# Patient Record
Sex: Male | Born: 1950 | Race: Black or African American | Hispanic: No | Marital: Single | Smoking: Current some day smoker
Health system: Southern US, Community
[De-identification: ages and names within clinical notes are randomized; demographics above are authoritative.]

## PROBLEM LIST (undated history)

## (undated) DIAGNOSIS — E669 Obesity, unspecified: Secondary | ICD-10-CM

## (undated) DIAGNOSIS — K219 Gastro-esophageal reflux disease without esophagitis: Secondary | ICD-10-CM

## (undated) DIAGNOSIS — E785 Hyperlipidemia, unspecified: Secondary | ICD-10-CM

## (undated) DIAGNOSIS — F419 Anxiety disorder, unspecified: Secondary | ICD-10-CM

## (undated) DIAGNOSIS — M1990 Unspecified osteoarthritis, unspecified site: Secondary | ICD-10-CM

## (undated) DIAGNOSIS — K759 Inflammatory liver disease, unspecified: Secondary | ICD-10-CM

## (undated) DIAGNOSIS — G4730 Sleep apnea, unspecified: Secondary | ICD-10-CM

## (undated) DIAGNOSIS — I10 Essential (primary) hypertension: Secondary | ICD-10-CM

## (undated) DIAGNOSIS — R7303 Prediabetes: Secondary | ICD-10-CM

## (undated) DIAGNOSIS — K635 Polyp of colon: Secondary | ICD-10-CM

## (undated) DIAGNOSIS — F32A Depression, unspecified: Secondary | ICD-10-CM

## (undated) DIAGNOSIS — G629 Polyneuropathy, unspecified: Secondary | ICD-10-CM

## (undated) DIAGNOSIS — I2699 Other pulmonary embolism without acute cor pulmonale: Secondary | ICD-10-CM

## (undated) DIAGNOSIS — E079 Disorder of thyroid, unspecified: Secondary | ICD-10-CM

## (undated) DIAGNOSIS — Z5189 Encounter for other specified aftercare: Secondary | ICD-10-CM

## (undated) DIAGNOSIS — E119 Type 2 diabetes mellitus without complications: Secondary | ICD-10-CM

## (undated) DIAGNOSIS — J189 Pneumonia, unspecified organism: Secondary | ICD-10-CM

## (undated) DIAGNOSIS — D689 Coagulation defect, unspecified: Secondary | ICD-10-CM

## (undated) HISTORY — DX: Coagulation defect, unspecified: D68.9

## (undated) HISTORY — DX: Pneumonia, unspecified organism: J18.9

## (undated) HISTORY — DX: Polyp of colon: K63.5

## (undated) HISTORY — DX: Essential (primary) hypertension: I10

## (undated) HISTORY — PX: COLONOSCOPY: SHX174

## (undated) HISTORY — DX: Sleep apnea, unspecified: G47.30

## (undated) HISTORY — DX: Anxiety disorder, unspecified: F41.9

## (undated) HISTORY — DX: Obesity, unspecified: E66.9

## (undated) HISTORY — DX: Depression, unspecified: F32.A

## (undated) HISTORY — DX: Unspecified osteoarthritis, unspecified site: M19.90

## (undated) HISTORY — DX: Encounter for other specified aftercare: Z51.89

## (undated) HISTORY — DX: Other pulmonary embolism without acute cor pulmonale: I26.99

## (undated) HISTORY — PX: THYROID SURGERY: SHX805

## (undated) HISTORY — PX: OTHER SURGICAL HISTORY: SHX169

---
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
## 1998-03-17 ENCOUNTER — Emergency Department (HOSPITAL_COMMUNITY): Admission: EM | Admit: 1998-03-17 | Discharge: 1998-03-17 | Payer: Self-pay

## 1999-08-17 ENCOUNTER — Encounter: Payer: Self-pay | Admitting: Emergency Medicine

## 1999-08-17 ENCOUNTER — Emergency Department (HOSPITAL_COMMUNITY): Admission: EM | Admit: 1999-08-17 | Discharge: 1999-08-17 | Payer: Self-pay | Admitting: Emergency Medicine

## 1999-12-24 ENCOUNTER — Inpatient Hospital Stay (HOSPITAL_COMMUNITY): Admission: EM | Admit: 1999-12-24 | Discharge: 1999-12-26 | Payer: Self-pay | Admitting: *Deleted

## 1999-12-24 ENCOUNTER — Encounter: Payer: Self-pay | Admitting: Emergency Medicine

## 2002-07-13 ENCOUNTER — Encounter: Payer: Self-pay | Admitting: Internal Medicine

## 2002-07-13 ENCOUNTER — Inpatient Hospital Stay (HOSPITAL_COMMUNITY): Admission: EM | Admit: 2002-07-13 | Discharge: 2002-07-16 | Payer: Self-pay | Admitting: Emergency Medicine

## 2002-07-14 ENCOUNTER — Encounter: Payer: Self-pay | Admitting: Internal Medicine

## 2002-10-12 ENCOUNTER — Emergency Department (HOSPITAL_COMMUNITY): Admission: EM | Admit: 2002-10-12 | Discharge: 2002-10-12 | Payer: Self-pay | Admitting: Emergency Medicine

## 2002-10-12 ENCOUNTER — Encounter: Payer: Self-pay | Admitting: Emergency Medicine

## 2003-09-25 ENCOUNTER — Emergency Department (HOSPITAL_COMMUNITY): Admission: EM | Admit: 2003-09-25 | Discharge: 2003-09-25 | Payer: Self-pay | Admitting: Emergency Medicine

## 2003-09-27 ENCOUNTER — Emergency Department (HOSPITAL_COMMUNITY): Admission: EM | Admit: 2003-09-27 | Discharge: 2003-09-27 | Payer: Self-pay | Admitting: Emergency Medicine

## 2005-08-22 ENCOUNTER — Emergency Department (HOSPITAL_COMMUNITY): Admission: EM | Admit: 2005-08-22 | Discharge: 2005-08-22 | Payer: Self-pay | Admitting: Emergency Medicine

## 2005-08-26 ENCOUNTER — Emergency Department (HOSPITAL_COMMUNITY): Admission: EM | Admit: 2005-08-26 | Discharge: 2005-08-27 | Payer: Self-pay | Admitting: Emergency Medicine

## 2005-09-19 ENCOUNTER — Emergency Department (HOSPITAL_COMMUNITY): Admission: EM | Admit: 2005-09-19 | Discharge: 2005-09-19 | Payer: Self-pay | Admitting: Emergency Medicine

## 2012-02-18 ENCOUNTER — Encounter (HOSPITAL_COMMUNITY): Payer: Self-pay | Admitting: Family Medicine

## 2012-02-18 ENCOUNTER — Emergency Department (HOSPITAL_COMMUNITY)
Admission: EM | Admit: 2012-02-18 | Discharge: 2012-02-24 | Disposition: A | Discharge status: Other Acute Inpatient Hospital | Payer: Medicare Other | Attending: Emergency Medicine | Admitting: Emergency Medicine

## 2012-02-18 DIAGNOSIS — R45851 Suicidal ideations: Secondary | ICD-10-CM | POA: Insufficient documentation

## 2012-02-18 DIAGNOSIS — F1410 Cocaine abuse, uncomplicated: Secondary | ICD-10-CM | POA: Insufficient documentation

## 2012-02-18 DIAGNOSIS — F1010 Alcohol abuse, uncomplicated: Secondary | ICD-10-CM | POA: Insufficient documentation

## 2012-02-18 DIAGNOSIS — F17200 Nicotine dependence, unspecified, uncomplicated: Secondary | ICD-10-CM | POA: Insufficient documentation

## 2012-02-18 DIAGNOSIS — Z9889 Other specified postprocedural states: Secondary | ICD-10-CM | POA: Insufficient documentation

## 2012-02-18 DIAGNOSIS — F4310 Post-traumatic stress disorder, unspecified: Secondary | ICD-10-CM | POA: Insufficient documentation

## 2012-02-18 LAB — COMPREHENSIVE METABOLIC PANEL
ALT: 14 U/L (ref 0–53)
AST: 21 U/L (ref 0–37)
Alkaline Phosphatase: 52 U/L (ref 39–117)
CO2: 27 mEq/L (ref 19–32)
Chloride: 104 mEq/L (ref 96–112)
GFR calc non Af Amer: 60 mL/min — ABNORMAL LOW (ref >90)
Glucose, Bld: 92 mg/dL (ref 70–99)
Sodium: 139 mEq/L (ref 135–145)
Total Bilirubin: 0.8 mg/dL (ref 0.3–1.2)

## 2012-02-18 LAB — CBC WITH DIFFERENTIAL/PLATELET
Basophils Absolute: 0 10*3/uL (ref 0.0–0.1)
HCT: 42.4 % (ref 39.0–52.0)
Lymphocytes Relative: 45 % (ref 12–46)
Lymphs Abs: 2.4 10*3/uL (ref 0.7–4.0)
Neutro Abs: 2.4 10*3/uL (ref 1.7–7.7)
Platelets: 145 10*3/uL — ABNORMAL LOW (ref 150–400)
RBC: 4.63 MIL/uL (ref 4.22–5.81)
RDW: 14.5 % (ref 11.5–15.5)
WBC: 5.5 10*3/uL (ref 4.0–10.5)

## 2012-02-18 LAB — SALICYLATE LEVEL: Salicylate Lvl: <2 mg/dL — ABNORMAL LOW (ref 2.8–20.0)

## 2012-02-18 LAB — RAPID URINE DRUG SCREEN, HOSP PERFORMED
Barbiturates: NOT DETECTED
Benzodiazepines: NOT DETECTED

## 2012-02-18 LAB — ACETAMINOPHEN LEVEL: Acetaminophen (Tylenol), Serum: <15 ug/mL (ref 10–30)

## 2012-02-18 MED ORDER — NICOTINE 21 MG/24HR TD PT24
21.0000 mg | MEDICATED_PATCH | Freq: Every day | TRANSDERMAL | Status: DC
  Administered 2012-02-18 – 2012-02-24 (×7): 21 mg via TRANSDERMAL
  Filled 2012-02-18 (×7): qty 1

## 2012-02-18 MED ORDER — ONDANSETRON HCL 8 MG PO TABS: 4.0000 mg | ORAL_TABLET | Freq: Three times a day (TID) | ORAL | Status: DC | PRN

## 2012-02-18 MED ORDER — IBUPROFEN 200 MG PO TABS
600.0000 mg | ORAL_TABLET | Freq: Three times a day (TID) | ORAL | Status: DC | PRN
  Administered 2012-02-18 – 2012-02-19 (×2): 600 mg via ORAL
  Filled 2012-02-18 (×3): qty 3

## 2012-02-18 NOTE — ED Notes (Signed)
Pt given blue scrubs to put on.

## 2012-02-18 NOTE — ED Provider Notes (Signed)
History   This chart was scribed for Glynn Octave, MD by Toya Smothers, ED Scribe. The patient was seen in room TR10C/TR10C. Patient's care was started at 1547.  CSN: [id number]  Arrival date & time 02/18/12  1547   First MD Initiated Contact with Patient 02/18/12 1601      Chief Complaint  Patient presents with  . Suicidal   HPI  Larry Richmond is a 61 y.o. male who presents to the Emergency Department complaining of 1 week of suicidal ideations. 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  [facility] and was instructed to seek evaluation at the Emergency Department or to go travel to [facility] hospital in [place]. Pt also c/o 2 weeks of mild left shoulder pain, denying injury mechanism. Symptoms have not been treated PTA. No self injury, hallucinations, homicidal ideations, fever, chills, cough, congestion, rhinorrhea, chest pain, SOB, or n/v/d. Pt is a current everyday smoker, and admits occasional alcohol use, and cocaine addiction (last use 2 days ago). Surgical Hx includes Thyroid surgery.   History reviewed. No pertinent past medical history.  Past Surgical History  Procedure Date  . Thyroid surgery     History reviewed. No pertinent family history.  History  Substance Use Topics  . Smoking status: Current Every Day Smoker    Types: Cigarettes  . Smokeless tobacco: Not on file  . Alcohol Use: Yes     Comment: occasiona;      Review of Systems  Psychiatric/Behavioral: Positive for suicidal ideas. Negative for self-injury.  All other systems reviewed and are negative.    Allergies  Review of patient's allergies indicates no known allergies.  Home Medications  No current outpatient prescriptions on file.  BP 156/93  Pulse 70  Temp 98.2 F (36.8 C) (Oral)  Resp 18   SpO2 97%  Physical Exam  Nursing note and vitals reviewed. Constitutional: He appears well-developed and well-nourished.  HENT:  Head: Normocephalic and atraumatic.  Eyes: Conjunctivae normal are normal. Pupils are equal, round, and reactive to light.  Neck: Neck supple. No tracheal deviation present. No thyromegaly present.  Cardiovascular: Normal rate and regular rhythm.   No murmur heard. Pulmonary/Chest: Effort normal and breath sounds normal.  Abdominal: Soft. Bowel sounds are normal. He exhibits no distension. There is no tenderness.  Musculoskeletal: Normal range of motion. He exhibits no edema and no tenderness.  Neurological: He is alert. Coordination normal.  Skin: Skin is warm and dry. No rash noted.  Psychiatric: He has a normal mood and affect.       Flat affect    ED Course  Procedures DIAGNOSTIC STUDIES: Oxygen Saturation is 97% on room air, normal by my interpretation.    COORDINATION OF CARE: 15:53- Ordered ED EKG Once. 16:10- Evaluated Pt. Pt is awake, alert, and without distress. 16:18- Ordered Consult to call act team Once.    Labs Reviewed  SALICYLATE LEVEL - Abnormal; Notable for the following:    Salicylate Lvl <2.0 (*)     All other components within normal limits  CBC WITH DIFFERENTIAL - Abnormal; Notable for the following:    Platelets 145 (*)     All other components within normal limits  COMPREHENSIVE METABOLIC PANEL - Abnormal; Notable for the following:    GFR calc non Af Amer 60 (*)  GFR calc Af Amer 69 (*)     All other components within normal limits  ETHANOL  ACETAMINOPHEN LEVEL  TROPONIN I  URINE RAPID DRUG SCREEN (HOSP PERFORMED)   No results found.   No diagnosis found.    MDM  Suicidal thoughts for the past 1 week, no specific plan.  Cocaine abuse, last used 2 days ago.  Intermittent alcohol use.  Screening labs, EKG, d/w ACT team. EKG and troponin unremarkable. Clear for psychiatric evaluation.    Date:  02/18/2012  Rate: 70  Rhythm: normal sinus rhythm  QRS Axis: normal  Intervals: normal  ST/T Wave abnormalities: normal  Conduction Disutrbances:none  Narrative Interpretation:   Old EKG Reviewed: none available    I personally performed the services described in this documentation, which was scribed in my presence. The recorded information has been reviewed and is accurate.    Glynn Octave, MD 02/18/12 [phone]

## 2012-02-18 NOTE — ED Notes (Signed)
Dinner tray ordered for pt

## 2012-02-18 NOTE — ED Notes (Signed)
AC called for sitter 

## 2012-02-18 NOTE — ED Notes (Addendum)
Pt A.O. X 4. Reports thoughts of "hurting himself". Denies specific plan. Denies HI. Denies Hallucinations.  Calm and cooperative. Flat affect. Here for detox from cocaine. Denies N/V/D/C. Reports shoulder pain 5/10. Denies any other pain. Respirations even and regular.  Advised of the unit policy to lock up belongings. Verbalized understanding. Belongings removed from the room. Sitter at bedside. Maintains safe distance. No further needs at this time.

## 2012-02-18 NOTE — ED Notes (Signed)
sts that he is addicted to cocaine, wants to get clean and is having suicidal thoughts. sts the last use was Wednesday. sts he normally uses whenever he can get it. Denies attempt at suicide

## 2012-02-19 NOTE — Progress Notes (Signed)
12:20 PM Pt was asleep when I rounded.

## 2012-02-19 NOTE — ED Notes (Signed)
Regular lunch order placed.  

## 2012-02-19 NOTE — Progress Notes (Signed)
12:22 PM Pt was asleep when I rounded.

## 2012-02-19 NOTE — BH Assessment (Signed)
Assessment Note   Larry Richmond is an 61 y.o. male seeking assistance with depression, suicidal ideation, and crack cocaine abuse. 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  He is also experiencing an increase in depressive symptoms with suicidal ideation for the last two weeks.  His depression led to his usage of crack cocaine approximately 6 months ago.  He reports that today he felt that he could not keep himself safe outside of the hospital so he presented to the ED after consulting with the [facility] in [place] and being told to come to the ED for referral.  HE reports that he has lost 50 lbs in the last six months and he is tearful, fatigued, isolating, feeling worthless, angry and irritable and has no interest in his usual pleasures.  Consulted with AOD, Ray, at [facility] who states there are no available beds at this time, but they will review his information.  Faxed packet to [facility].  Axis I: Depressive Disorder NOS, Post Traumatic Stress Disorder, Substance Abuse and Cocaine Abuse Axis II: Deferred Axis III: History reviewed. No pertinent past medical history. Axis IV: problems with access to health care services Axis V: 41-50 serious symptoms  Past Medical History: History reviewed. No pertinent past medical history.  Past Surgical History  Procedure Date  . Thyroid surgery     Family History: History reviewed. No pertinent family history.  Social History:  reports that he has been smoking Cigarettes.  He does not have any smokeless tobacco history on file. He reports that he drinks alcohol. He reports that he uses illicit drugs (Cocaine).  Additional Social History:  Alcohol / Drug Use History of alcohol / drug use?: Yes Substance #1 Name of Substance 1: Crack Cocaine 1 - Age of First Use: 60s 1 - Amount (size/oz): quarter 1 - Frequency: daily 1 -  Duration: 6 months 1 - Last Use / Amount: Wednesday wuarter  CIWA: CIWA-Ar BP: 121/72 mmHg Pulse Rate: 58  COWS:    Allergies: No Known Allergies  Home Medications:  (Not in a hospital admission)  OB/GYN Status:  No LMP for male patient.  General Assessment Data Location of Assessment: [facility] ED Can pt return to current living arrangement?: Yes Admission Status: Voluntary Is patient capable of signing voluntary admission?: Yes Transfer from: Acute Hospital Referral Source: Self/Family/Friend  Education Status Is patient currently in school?: No  Risk to self Suicidal Ideation: Yes-Currently Present Suicidal Intent: No-Not Currently/Within Last 6 Months Is patient at risk for suicide?: Yes Suicidal Plan?: No Access to Means: No What has been your use of drugs/alcohol within the last 12 months?: crack cocaine for six months Previous Attempts/Gestures: No How many times?: 0  Intentional Self Injurious Behavior: None Family Suicide History: No Recent stressful life event(s): Other (Comment) (depression, flashbacks) Persecutory voices/beliefs?: No Depression: Yes Depression Symptoms: Despondent;Isolating;Tearfulness;Fatigue;Guilt;Loss of interest in usual pleasures;Feeling angry/irritable;Feeling worthless/self pity Substance abuse history and/or treatment for substance abuse?: No Suicide prevention information given to non-admitted patients: Not applicable  Risk to Others Homicidal Ideation: No Thoughts of Harm to Others: No Current Homicidal Intent: No Current Homicidal Plan: No Access to Homicidal Means: No History of harm to others?: No Assessment of Violence: None Noted Does patient have access to weapons?: No Criminal Charges Pending?: No Does patient have a court date: No  Psychosis Hallucinations: Visual (with flashbacks) Delusions: None noted  Mental  Status Report Appear/Hygiene: Improved Eye Contact: Fair Motor Activity: Freedom of movement Speech:  Logical/coherent Level of Consciousness: Quiet/awake Mood: Depressed Affect: Blunted;Depressed Anxiety Level: None Thought Processes: Coherent;Relevant Judgement: Unimpaired Orientation: Person;Place;Time;Situation Obsessive Compulsive Thoughts/Behaviors: None  Cognitive Functioning Concentration: Decreased Memory: Recent Intact;Remote Intact IQ: Average Insight: Fair Impulse Control: Fair Appetite: Poor Weight Loss: 50  (in 6 mos) Weight Gain: 0  Sleep: No Change Total Hours of Sleep: 8  (still feeling tired) Vegetative Symptoms: None  ADLScreening [facility] Assessment Services) Patient's cognitive ability adequate to safely complete daily activities?: Yes Patient able to express need for assistance with ADLs?: Yes Independently performs ADLs?: Yes (appropriate for developmental age)  Abuse/Neglect [facility]) Physical Abuse: Denies Verbal Abuse: Denies Sexual Abuse: Denies  Prior Inpatient Therapy Prior Inpatient Therapy: No  Prior Outpatient Therapy Prior Outpatient Therapy: No  ADL Screening (condition at time of admission) Patient's cognitive ability adequate to safely complete daily activities?: Yes Patient able to express need for assistance with ADLs?: Yes Independently performs ADLs?: Yes (appropriate for developmental age)       Abuse/Neglect Assessment (Assessment to be complete while patient is alone) Physical Abuse: Denies Verbal Abuse: Denies Sexual Abuse: Denies Values / Beliefs Cultural Requests During Hospitalization: None Spiritual Requests During Hospitalization: None   Advance Directives (For Healthcare) Advance Directive: Patient does not have advance directive Nutrition Screen- [facility] Adult/WL/AP Patient's home diet: Regular Have you recently lost weight without trying?: Yes If yes, how much weight have you lost?: 34 lb or more Have you been eating poorly because of a decreased appetite?: Yes Malnutrition Screening Tool Score: 5   Additional  Information 1:1 In Past 12 Months?: No CIRT Risk: No Elopement Risk: No Does patient have medical clearance?: Yes     Disposition:  Disposition Disposition of Patient: Inpatient treatment program Type of inpatient treatment program: Adult (referred to [facility])  On Site Evaluation by:  Rancour Reviewed with Physician:     Steward Ros 02/19/2012 1:18 AM

## 2012-02-20 NOTE — ED Notes (Signed)
Sitter relieved for brake. Pt monitored on camera. NAD.

## 2012-02-20 NOTE — ED Notes (Signed)
ACT Team at bedside.  

## 2012-02-20 NOTE — ED Provider Notes (Signed)
Pt awaiting placement, no new issues  Toy Baker, MD 02/20/12 1228

## 2012-02-20 NOTE — ED Notes (Addendum)
Pt resting in bed watching tv. Introduced self to pt.  Flat affect. Sitter at bedside maintaining safe distance. Pt denies N/V/D/C. Pt denies any complaints at this time. Will continue to monitor.

## 2012-02-20 NOTE — ED Notes (Signed)
Spoke to pt's wife. Wife inquired about bring food from home to patient and visiting. Informed wife that no outside food or beverages can be brought in per policy for the safety of the patient and for the staff. Informed that she may visit as long as patient is in agreement. However, pt is not allowed overnight visits. Verbalized understanding. Stated she will visit tomorrow morning is weather permits. Patient made aware.

## 2012-02-20 NOTE — BH Assessment (Signed)
Assessment Note   Larry Richmond is an 61 y.o. male. This is reassessment of pt awaiting inpt psych placement.  Pt reports he is feeling better the last few days.  Denies current SI/HI/AV.  Pt does report that he was suicidal upon coming to the ED and he did not feel safe outside the hospital at that time.  Pt also reports that he has been having auditory hallucinations recently.  States he has been hearing a voice calling his name several times per week including the day he came to [facility].  Pt reports that he often responds to the voice as it seems real.  Pt continues to request admit and would like to be transferred to [facility].  Axis I: Major Depression, single episode, substance use  Axis II: Deferred Axis III: History reviewed. No pertinent past medical history. Axis IV: housing problems Axis V: 31-40 impairment in reality testing  Past Medical History: History reviewed. No pertinent past medical history.  Past Surgical History  Procedure Date  . Thyroid surgery     Family History: History reviewed. No pertinent family history.  Social History:  reports that he has been smoking Cigarettes.  He does not have any smokeless tobacco history on file. He reports that he drinks alcohol. He reports that he uses illicit drugs (Cocaine).  Additional Social History:  Alcohol / Drug Use Pain Medications: Pt denies Prescriptions: Pt denies Over the Counter: Pt denies History of alcohol / drug use?: Yes Negative Consequences of Use: Personal relationships;Financial Substance #1 Name of Substance 1: crack cocaine 1 - Age of First Use: 40's 1 - Amount (size/oz): $300 1 - Frequency: 1-2x month 1 - Duration: 6-7 months 1 - Last Use / Amount: 12/5, $300 Substance #2 Name of Substance 2: alcohol 2 - Age of First Use: 16 2 - Amount (size/oz): 6 pack 2 - Frequency: 1-2x week 2 - Duration: 6-7 months 2 - Last Use / Amount: 12/4, 6 pack  CIWA: CIWA-Ar BP: 138/77 mmHg Pulse Rate: 62   Nausea and Vomiting: no nausea and no vomiting Tactile Disturbances: none Tremor: no tremor Auditory Disturbances: not present Paroxysmal Sweats: no sweat visible Visual Disturbances: not present Anxiety: mildly anxious Headache, Fullness in Head: none present Agitation: normal activity Orientation and Clouding of Sensorium: oriented and can do serial additions CIWA-Ar Total: 1  COWS: Clinical Opiate Withdrawal Scale (COWS) Resting Pulse Rate: Pulse Rate 80 or below Sweating: Subjective report of chills or flushing Restlessness: Able to sit still Pupil Size: Pupils pinned or normal size for room light Bone or Joint Aches: Not present (pt reports that he has chronic pain in his left shoulder but denies any other pain) Runny Nose or Tearing: Not present GI Upset: No GI symptoms Tremor: Tremor can be felt, but not observed Yawning: No yawning Anxiety or Irritability: None Gooseflesh Skin: Skin is smooth COWS Total Score: 2   Allergies: No Known Allergies  Home Medications:  (Not in a hospital admission)  OB/GYN Status:  No LMP for male patient.  General Assessment Data Location of Assessment: [facility] ED ACT Assessment: Yes Living Arrangements: Other relatives Can pt return to current living arrangement?: Yes Admission Status: Voluntary Is patient capable of signing voluntary admission?: Yes Transfer from: Acute Hospital Referral Source: Self/Family/Friend  Education Status Is patient currently in school?: No  Risk to self Suicidal Ideation: No-Not Currently/Within Last 6 Months Suicidal Intent: No-Not Currently/Within Last 6 Months Is patient at risk for suicide?: Yes Suicidal Plan?: No Access  to Means: No What has been your use of drugs/alcohol within the last 12 months?: current use of crack and alcohol Previous Attempts/Gestures: No How many times?: 0  Intentional Self Injurious Behavior: None Family Suicide History: No Recent stressful life event(s): Other  (Comment) (substance abuse issues, "going nowhere" in life, flashbacks) Persecutory voices/beliefs?: No Depression: Yes Depression Symptoms: Despondent;Insomnia;Tearfulness;Isolating;Fatigue;Feeling angry/irritable Substance abuse history and/or treatment for substance abuse?: Yes Suicide prevention information given to non-admitted patients: Not applicable  Risk to Others Homicidal Ideation: No Thoughts of Harm to Others: No Current Homicidal Intent: No Current Homicidal Plan: No Access to Homicidal Means: No History of harm to others?: No Assessment of Violence: None Noted Does patient have access to weapons?: No Criminal Charges Pending?: No Does patient have a court date: No  Psychosis Hallucinations:  (pt reports auditory, hears a voice, several times per week) Delusions: None noted  Mental Status Report Appear/Hygiene: Other (Comment) (casual) Eye Contact: Good Motor Activity: Unremarkable Speech: Logical/coherent Level of Consciousness: Alert Mood: Other (Comment) (cooperative) Affect: Appropriate to circumstance Anxiety Level: Minimal Thought Processes: Coherent;Relevant Judgement: Unimpaired Orientation: Person;Place;Time;Situation Obsessive Compulsive Thoughts/Behaviors: None  Cognitive Functioning Concentration: Normal Memory: Recent Intact;Remote Intact IQ: Average Insight: Good Impulse Control: Fair Appetite: Poor Weight Loss: 40  Weight Gain: 0  Sleep: Decreased Total Hours of Sleep: 4  Vegetative Symptoms: None  ADLScreening [facility] Assessment Services) Patient's cognitive ability adequate to safely complete daily activities?: Yes Patient able to express need for assistance with ADLs?: Yes Independently performs ADLs?: Yes (appropriate for developmental age)  Abuse/Neglect [facility]) Physical Abuse: Denies Verbal Abuse: Denies Sexual Abuse: Denies  Prior Inpatient Therapy Prior Inpatient Therapy: No  Prior Outpatient Therapy Prior Outpatient  Therapy: No  ADL Screening (condition at time of admission) Patient's cognitive ability adequate to safely complete daily activities?: Yes Patient able to express need for assistance with ADLs?: Yes Independently performs ADLs?: Yes (appropriate for developmental age)       Abuse/Neglect Assessment (Assessment to be complete while patient is alone) Physical Abuse: Denies Verbal Abuse: Denies Sexual Abuse: Denies Values / Beliefs Cultural Requests During Hospitalization: None Spiritual Requests During Hospitalization: None   Advance Directives (For Healthcare) Advance Directive: Patient does not have advance directive;Patient would not like information Nutrition Screen- [facility] Adult/WL/AP Patient's home diet: Regular Have you recently lost weight without trying?: Yes If yes, how much weight have you lost?: 34 lb or more Have you been eating poorly because of a decreased appetite?: Yes Malnutrition Screening Tool Score: 5   Additional Information 1:1 In Past 12 Months?: No CIRT Risk: No Elopement Risk: No Does patient have medical clearance?: Yes     Disposition: Pt has been referred to [facility].  Spoke to Lupita Leash there at 1900 and she does have the referral.  It will be reviewed tomorrow, Monday.  She recommended calling ext [phone] after lunch tomorrow and they should have more information on available beds. Disposition Disposition of Patient: Inpatient treatment program Type of inpatient treatment program: Adult  On Site Evaluation by:   Reviewed with Physician:     Lorri Frederick 02/20/2012 10:06 PM

## 2012-02-21 NOTE — BH Assessment (Signed)
Assessment Note   Larry Richmond is an 61 y.o. male that was reassessed this day.  Pt admitted to SI this morning, but stated it has gotten better over the last few days.  Pt denies HI.  Pt denies SA.  Pt stated that the SI comes and goes and taht he has been hearing a voice he responds to several times per week.  Pt pleasant, cooperative and going to take a shower at this time.  Called [place] and per Killian @ [phone], no beds today, but the [place] will call when have beds.  Completed reassessment, assessment notification and faxed to [facility] to log.  Updated ED staff.  Previous Note:  Larry Richmond is an 61 y.o. male. This is reassessment of pt awaiting inpt psych placement. Pt reports he is feeling better the last few days. Denies current SI/HI/AV. Pt does report that he was suicidal upon coming to the ED and he did not feel safe outside the hospital at that time. Pt also reports that he has been having auditory hallucinations recently. States he has been hearing a voice calling his name several times per week including the day he came to [facility]. Pt reports that he often responds to the voice as it seems real. Pt continues to request admit and would like to be transferred to [facility].   Axis I: 309.81 PTSD, 305.00 Alcohol Abuse, 305.60 Cocaine Abuse Axis II: Deferred Axis III: History reviewed. No pertinent past medical history. Axis IV: other psychosocial or environmental problems, problems related to social environment, problems with access to health care services and problems with primary support group Axis V: 21-30 behavior considerably influenced by delusions or hallucinations OR serious impairment in judgment, communication OR inability to function in almost all areas  Past Medical History: History reviewed. No pertinent past medical history.  Past Surgical History  Procedure Date  . Thyroid surgery     Family History: History reviewed. No pertinent family history.  Social History:   reports that he has been smoking Cigarettes.  He does not have any smokeless tobacco history on file. He reports that he drinks alcohol. He reports that he uses illicit drugs (Cocaine).  Additional Social History:  Alcohol / Drug Use Pain Medications: pt denies Prescriptions: pt denies Over the Counter: pt denies History of alcohol / drug use?: Yes Longest period of sobriety (when/how long): unknown Negative Consequences of Use: Personal relationships;Financial Withdrawal Symptoms:  (pt denies) Substance #1 Name of Substance 1: crack cocaine 1 - Age of First Use: 40's 1 - Amount (size/oz): $300 1 - Frequency: 1-2x month 1 - Duration: 6-7 months 1 - Last Use / Amount: 12/5, $300 Substance #2 Name of Substance 2: alcohol 2 - Age of First Use: 16 2 - Amount (size/oz): 6 pack 2 - Frequency: 1-2x week 2 - Duration: 6-7 months 2 - Last Use / Amount: 12/4, 6 pack  CIWA: CIWA-Ar BP: 130/74 mmHg Pulse Rate: 70  Nausea and Vomiting: no nausea and no vomiting Tactile Disturbances: none Tremor: no tremor Auditory Disturbances: not present Paroxysmal Sweats: no sweat visible Visual Disturbances: not present Anxiety: mildly anxious Headache, Fullness in Head: none present Agitation: normal activity Orientation and Clouding of Sensorium: oriented and can do serial additions CIWA-Ar Total: 1  COWS: Clinical Opiate Withdrawal Scale (COWS) Resting Pulse Rate: Pulse Rate 80 or below Sweating: Subjective report of chills or flushing Restlessness: Able to sit still Pupil Size: Pupils pinned or normal size for room light Bone  or Joint Aches: Not present (pt reports that he has chronic pain in his left shoulder but denies any other pain) Runny Nose or Tearing: Not present GI Upset: No GI symptoms Tremor: Tremor can be felt, but not observed Yawning: No yawning Anxiety or Irritability: None Gooseflesh Skin: Skin is smooth COWS Total Score: 2   Allergies: No Known Allergies  Home  Medications:  (Not in a hospital admission)  OB/GYN Status:  No LMP for male patient.  General Assessment Data Location of Assessment: [facility] ED ACT Assessment: Yes Living Arrangements: Other relatives Can pt return to current living arrangement?: Yes Admission Status: Voluntary Is patient capable of signing voluntary admission?: Yes Transfer from: Acute Hospital Referral Source: Self/Family/Friend  Education Status Is patient currently in school?: No  Risk to self Suicidal Ideation: Yes-Currently Present Suicidal Intent: No-Not Currently/Within Last 6 Months Is patient at risk for suicide?: Yes Suicidal Plan?: No Access to Means: No What has been your use of drugs/alcohol within the last 12 months?: Current use of crack and alcohol Previous Attempts/Gestures: No How many times?: 0  Other Self Harm Risks: pt denies Triggers for Past Attempts: None known Intentional Self Injurious Behavior: None Family Suicide History: No Recent stressful life event(s): Other (Comment) (SA issues, "going nowhere in life," flashbacks) Persecutory voices/beliefs?: No Depression: Yes Depression Symptoms: Despondent;Insomnia;Tearfulness;Isolating;Fatigue;Feeling worthless/self pity;Feeling angry/irritable Substance abuse history and/or treatment for substance abuse?: Yes Suicide prevention information given to non-admitted patients: Not applicable  Risk to Others Homicidal Ideation: No Thoughts of Harm to Others: No Current Homicidal Intent: No Current Homicidal Plan: No Access to Homicidal Means: No Identified Victim: pt denies History of harm to others?: No Assessment of Violence: None Noted Violent Behavior Description: na - pt calm, cooperative Does patient have access to weapons?: No Criminal Charges Pending?: No Does patient have a court date: No  Psychosis Hallucinations: Auditory;Visual (pt reports hears a voice several times per week, flashbacks) Delusions: None noted  Mental  Status Report Appear/Hygiene: Other (Comment) (casual in scrubs) Eye Contact: Good Motor Activity: Unremarkable Speech: Logical/coherent Level of Consciousness: Alert Mood: Depressed Affect: Appropriate to circumstance Anxiety Level: Minimal Thought Processes: Coherent;Relevant Judgement: Unimpaired Orientation: Person;Place;Time;Situation Obsessive Compulsive Thoughts/Behaviors: None  Cognitive Functioning Concentration: Normal Memory: Recent Intact;Remote Intact IQ: Average Insight: Fair Impulse Control: Fair Appetite: Poor Weight Loss: 40  Weight Gain: 0  Sleep: Decreased Total Hours of Sleep: 4  Vegetative Symptoms: None  ADLScreening [facility] Assessment Services) Patient's cognitive ability adequate to safely complete daily activities?: Yes Patient able to express need for assistance with ADLs?: Yes Independently performs ADLs?: Yes (appropriate for developmental age)  Abuse/Neglect [facility]) Physical Abuse: Denies Verbal Abuse: Denies Sexual Abuse: Denies  Prior Inpatient Therapy Prior Inpatient Therapy: No Prior Therapy Dates: na Prior Therapy Facilty/Provider(s): na Reason for Treatment: na  Prior Outpatient Therapy Prior Outpatient Therapy: No Prior Therapy Dates: na Prior Therapy Facilty/Provider(s): na Reason for Treatment: na  ADL Screening (condition at time of admission) Patient's cognitive ability adequate to safely complete daily activities?: Yes Patient able to express need for assistance with ADLs?: Yes Independently performs ADLs?: Yes (appropriate for developmental age)  Home Assistive Devices/Equipment Home Assistive Devices/Equipment: None    Abuse/Neglect Assessment (Assessment to be complete while patient is alone) Physical Abuse: Denies Verbal Abuse: Denies Sexual Abuse: Denies Exploitation of patient/patient's resources: Denies Self-Neglect: Denies Values / Beliefs Cultural Requests During Hospitalization: None Spiritual Requests  During Hospitalization: None Consults Spiritual Care Consult Needed: No Social Work Consult Needed: No Merchant navy officer (For  Healthcare) Advance Directive: Patient does not have advance directive;Patient would not like information Nutrition Screen- [facility] Adult/WL/AP Patient's home diet: Regular Have you recently lost weight without trying?: Yes If yes, how much weight have you lost?: 34 lb or more Have you been eating poorly because of a decreased appetite?: Yes Malnutrition Screening Tool Score: 5   Additional Information 1:1 In Past 12 Months?: No CIRT Risk: No Elopement Risk: No Does patient have medical clearance?: Yes     Disposition:  Disposition Disposition of Patient: Referred to;Inpatient treatment program Type of inpatient treatment program: Adult Patient referred to: Other (Comment) (Pending [facility])  On Site Evaluation by:   Reviewed with Physician:  Valene Bors, Rennis Harding 02/21/2012 10:08 AM

## 2012-02-21 NOTE — ED Notes (Signed)
Sitter at bedside.

## 2012-02-21 NOTE — ED Notes (Signed)
Pt up to take a shower at this time

## 2012-02-21 NOTE — ED Notes (Signed)
Sitter present

## 2012-02-21 NOTE — ED Notes (Signed)
Sitter at the bedside.

## 2012-02-22 NOTE — ED Notes (Signed)
Dinner tray ordered spoke with [place]

## 2012-02-22 NOTE — BH Assessment (Signed)
Assessment Note   Larry Richmond is an 61 y.o. male that was reassessed this day.  Pt currently endorses depressive sx.  Pt denies SI/HI or psychosis at this time.  Pt continues to endorse AVH.  Pt also admits to SA.  Called [place] to check on bed status and had to leave a message for Amy, transfer coordinator, at 0900.  Called [place], and no beds per [employer] at [employer].  Updated assessment disposition, completed assessment notification and faxed to [facility] to log.  Updated ED staff.  Previous Notes:  Larry Richmond is an 61 y.o. male that was reassessed this day. Pt admitted to SI this morning, but stated it has gotten better over the last few days. Pt denies HI. Pt denies SA. Pt stated that the SI comes and goes and taht he has been hearing a voice he responds to several times per week. Pt pleasant, cooperative and going to take a shower at this time. Called [place] and per Louise @ [phone], no beds today, but the [place] will call when have beds. Completed reassessment, assessment notification and faxed to [facility] to log. Updated ED staff.  Previous Note:  Larry Richmond is an 61 y.o. male. This is reassessment of pt awaiting inpt psych placement. Pt reports he is feeling better the last few days. Denies current SI/HI/AV. Pt does report that he was suicidal upon coming to the ED and he did not feel safe outside the hospital at that time. Pt also reports that he has been having auditory hallucinations recently. States he has been hearing a voice calling his name several times per week including the day he came to [facility]. Pt reports that he often responds to the voice as it seems real. Pt continues to request admit and would like to be transferred to [facility].      Axis I: 309.81 PTSD, 305.00 Alcohol Abuse, 305.60 Cocaine Abuse Axis II: Deferred Axis III: History reviewed. No pertinent past medical history. Axis IV: other psychosocial or environmental problems and problems related to social environment Axis  V: 31-40 impairment in reality testing  Past Medical History: History reviewed. No pertinent past medical history.  Past Surgical History  Procedure Date  . Thyroid surgery     Family History: History reviewed. No pertinent family history.  Social History:  reports that he has been smoking Cigarettes.  He does not have any smokeless tobacco history on file. He reports that he drinks alcohol. He reports that he uses illicit drugs (Cocaine).  Additional Social History:  Alcohol / Drug Use Pain Medications: pt denies Prescriptions: pt denies Over the Counter: pt denies History of alcohol / drug use?: Yes Longest period of sobriety (when/how long): unknown Negative Consequences of Use: Personal relationships;Financial Withdrawal Symptoms:  (pt denies) Substance #1 Name of Substance 1: crack cocaine 1 - Age of First Use: 40's 1 - Amount (size/oz): $300 1 - Frequency: 1-2x month 1 - Duration: 6-7 months 1 - Last Use / Amount: 12/5, $300 Substance #2 Name of Substance 2: alcohol 2 - Age of First Use: 16 2 - Amount (size/oz): 6 pack 2 - Frequency: 1-2x week 2 - Duration: 6-7 months 2 - Last Use / Amount: 12/4, 6 pack  CIWA: CIWA-Ar BP: 125/72 mmHg Pulse Rate: 60  Nausea and Vomiting: no nausea and no vomiting Tactile Disturbances: none Tremor: no tremor Auditory Disturbances: not present Paroxysmal Sweats: no sweat visible Visual Disturbances: not present Anxiety: mildly anxious Headache, Fullness  in Head: none present Agitation: normal activity Orientation and Clouding of Sensorium: oriented and can do serial additions CIWA-Ar Total: 1  COWS: Clinical Opiate Withdrawal Scale (COWS) Resting Pulse Rate: Pulse Rate 80 or below Sweating: Subjective report of chills or flushing Restlessness: Able to sit still Pupil Size: Pupils pinned or normal size for room light Bone or Joint Aches: Not present (pt reports that he has chronic pain in his left shoulder but denies any other  pain) Runny Nose or Tearing: Not present GI Upset: No GI symptoms Tremor: Tremor can be felt, but not observed Yawning: No yawning Anxiety or Irritability: None Gooseflesh Skin: Skin is smooth COWS Total Score: 2   Allergies: No Known Allergies  Home Medications:  (Not in a hospital admission)  OB/GYN Status:  No LMP for male patient.  General Assessment Data Location of Assessment: [facility] ED ACT Assessment: Yes Living Arrangements: Other relatives Can pt return to current living arrangement?: Yes Admission Status: Involuntary Is patient capable of signing voluntary admission?: Yes Transfer from: Acute Hospital Referral Source: Self/Family/Friend  Education Status Is patient currently in school?: No  Risk to self Suicidal Ideation: No-Not Currently/Within Last 6 Months Suicidal Intent: No-Not Currently/Within Last 6 Months Is patient at risk for suicide?: Yes Suicidal Plan?: No Access to Means: No What has been your use of drugs/alcohol within the last 12 months?: Current use of crack and alcohol Previous Attempts/Gestures: No How many times?: 0  Other Self Harm Risks: pt denies Triggers for Past Attempts: None known Intentional Self Injurious Behavior: None Family Suicide History: No Recent stressful life event(s): Other (Comment) (SA issues, "going nowhere in life," flashbacks) Persecutory voices/beliefs?: No Depression: Yes Depression Symptoms: Despondent;Insomnia;Tearfulness;Isolating;Fatigue;Feeling worthless/self pity;Feeling angry/irritable Substance abuse history and/or treatment for substance abuse?: Yes Suicide prevention information given to non-admitted patients: Not applicable  Risk to Others Homicidal Ideation: No Thoughts of Harm to Others: No Current Homicidal Intent: No Current Homicidal Plan: No Access to Homicidal Means: No Identified Victim: pt denies History of harm to others?: No Assessment of Violence: None Noted Violent Behavior  Description: na - pt calm, cooperative Does patient have access to weapons?: No Criminal Charges Pending?: No Does patient have a court date: No  Psychosis Hallucinations: Auditory;Visual (pt reports hears a voice several times per week, flashbacks) Delusions: None noted  Mental Status Report Appear/Hygiene: Other (Comment) (casual in scrubs) Eye Contact: Good Motor Activity: Unremarkable Speech: Logical/coherent Level of Consciousness: Alert Mood: Depressed Affect: Appropriate to circumstance Anxiety Level: Minimal Thought Processes: Coherent;Relevant Judgement: Unimpaired Orientation: Person;Place;Time;Situation Obsessive Compulsive Thoughts/Behaviors: None  Cognitive Functioning Concentration: Normal Memory: Recent Intact;Remote Intact IQ: Average Insight: Fair Impulse Control: Fair Appetite: Poor Weight Loss: 40  Weight Gain: 0  Sleep: Decreased Total Hours of Sleep: 4  Vegetative Symptoms: None  ADLScreening [facility] Assessment Services) Patient's cognitive ability adequate to safely complete daily activities?: Yes Patient able to express need for assistance with ADLs?: Yes Independently performs ADLs?: Yes (appropriate for developmental age)  Abuse/Neglect [facility]) Physical Abuse: Denies Verbal Abuse: Denies Sexual Abuse: Denies  Prior Inpatient Therapy Prior Inpatient Therapy: No Prior Therapy Dates: na Prior Therapy Facilty/Provider(s): na Reason for Treatment: na  Prior Outpatient Therapy Prior Outpatient Therapy: No Prior Therapy Dates: na Prior Therapy Facilty/Provider(s): na Reason for Treatment: na  ADL Screening (condition at time of admission) Patient's cognitive ability adequate to safely complete daily activities?: Yes Patient able to express need for assistance with ADLs?: Yes Independently performs ADLs?: Yes (appropriate for developmental age) Weakness of Legs:  None Weakness of Arms/Hands: None  Home Assistive Devices/Equipment Home  Assistive Devices/Equipment: None    Abuse/Neglect Assessment (Assessment to be complete while patient is alone) Physical Abuse: Denies Verbal Abuse: Denies Sexual Abuse: Denies Exploitation of patient/patient's resources: Denies Self-Neglect: Denies Values / Beliefs Cultural Requests During Hospitalization: None Spiritual Requests During Hospitalization: None Consults Spiritual Care Consult Needed: No Social Work Consult Needed: No Merchant navy officer (For Healthcare) Advance Directive: Patient does not have advance directive;Patient would not like information Nutrition Screen- [facility] Adult/WL/AP Patient's home diet: Regular Have you recently lost weight without trying?: Yes If yes, how much weight have you lost?: 34 lb or more Have you been eating poorly because of a decreased appetite?: Yes Malnutrition Screening Tool Score: 5   Additional Information 1:1 In Past 12 Months?: No CIRT Risk: No Elopement Risk: No Does patient have medical clearance?: Yes     Disposition:  Disposition Disposition of Patient: Referred to;Inpatient treatment program Type of inpatient treatment program: Adult Patient referred to: Other (Comment) (Pending [facility])  On Site Evaluation by:   Reviewed with Physician:  Pat Patrick, Rennis Harding 02/22/2012 9:16 AM

## 2012-02-22 NOTE — ED Provider Notes (Signed)
Pt is here for issues with substance abuse and SI.  Pt remains stable this am.  No complaints.  Awaiting placement  Celene Kras, MD 02/22/12 [phone]

## 2012-02-23 NOTE — ED Notes (Signed)
Patient received meal tray 

## 2012-02-23 NOTE — ED Notes (Signed)
Assumed patient care. Report received from Elizabeth, RN 

## 2012-02-23 NOTE — ED Notes (Signed)
Patient ambulated to shower with sitter at side.  Patient currently showering.

## 2012-02-23 NOTE — ED Notes (Signed)
Happy meal and diet sprite provided to patient

## 2012-02-23 NOTE — ED Notes (Signed)
Patient ambulating with sitter around the floor.  Patient tolerating well.

## 2012-02-23 NOTE — ED Notes (Signed)
Patient given new pair of scrubs and socks per request.  Patient wants to take shower.

## 2012-02-24 NOTE — BH Assessment (Signed)
Assessment Note  Update:  Received call from [facility] from [place], [employer], stating pt accepted to Dr. Allena Katz and could be transported to the [place].  Updated EDP Clarene Duke and ED staff.  Pt is voluntary, denies SI/HI or psychosis currently and is to be transferred to [facility] via [facility].  Coordinated this with Doug with [facility] who will be transporting pt.  Updated assessment, completed assessment notification and faxed to [facility] to log.     Disposition:  Disposition Disposition of Patient: Inpatient treatment program Type of inpatient treatment program: Adult Patient referred to: Other (Comment) (Pt accepted [facility])  On Site Evaluation by:   Reviewed with Physician:  Berlinda Last, Rennis Harding 02/24/2012 11:23 AM

## 2012-02-24 NOTE — ED Provider Notes (Signed)
Pt accepted to [facility], Dr. Allena Katz.  Will transfer stable.   Laray Anger, DO 02/24/12 1201

## 2012-02-24 NOTE — BH Assessment (Signed)
Assessment Note   Larry Richmond is an 61 y.o. male that was reassessed this day. Pt continues to endorse depressive sx. Pt denies SI/HI at this time. Pt continues to endorse AVH. Pt also admits to SA. Called [facility] [place] to check on bed status and had to leave a message for Amy, transfer coordinator, at 0830. Per last clinician on shift, [place] confirmed that they had all of the pt information needed.  Updated assessment disposition, completed assessment notification and faxed to [facility] to log. Updated ED staff.  Previous Notes:  Larry Richmond is an 61 y.o. male that was reassessed this day. Pt admitted to SI this morning, but stated it has gotten better over the last few days. Pt denies HI. Pt denies SA. Pt stated that the SI comes and goes and taht he has been hearing a voice he responds to several times per week. Pt pleasant, cooperative and going to take a shower at this time. Called [place] and per [place] @ [phone], no beds today, but the [place] will call when have beds. Completed reassessment, assessment notification and faxed to [facility] to log. Updated ED staff.  Previous Note:  Larry Richmond is an 61 y.o. male. This is reassessment of pt awaiting inpt psych placement. Pt reports he is feeling better the last few days. Denies current SI/HI/AV. Pt does report that he was suicidal upon coming to the ED and he did not feel safe outside the hospital at that time. Pt also reports that he has been having auditory hallucinations recently. States he has been hearing a voice calling his name several times per week including the day he came to [facility]. Pt reports that he often responds to the voice as it seems real. Pt continues to request admit and would like to be transferred to [facility].     Previous Notes:  Larry Richmond is an 61 y.o. male that was reassessed this day. Pt currently endorses depressive sx. Pt denies SI/HI or psychosis at this time. Pt continues to endorse AVH. Pt also admits to SA. Called  [place] to check on bed status and had to leave a message for Amy, transfer coordinator, at 0900. Called [place], and no beds per [employer] at [employer]. Updated assessment disposition, completed assessment notification and faxed to [facility] to log. Updated ED staff.  Previous Notes:  Larry Richmond is an 61 y.o. male that was reassessed this day. Pt admitted to SI this morning, but stated it has gotten better over the last few days. Pt denies HI. Pt denies SA. Pt stated that the SI comes and goes and taht he has been hearing a voice he responds to several times per week. Pt pleasant, cooperative and going to take a shower at this time. Called [place] and per [place] @ [phone], no beds today, but the [place] will call when have beds. Completed reassessment, assessment notification and faxed to [facility] to log. Updated ED staff.  Previous Note:  Larry Richmond is an 60 y.o. male. This is reassessment of pt awaiting inpt psych placement. Pt reports he is feeling better the last few days. Denies current SI/HI/AV. Pt does report that he was suicidal upon coming to the ED and he did not feel safe outside the hospital at that time. Pt also reports that he has been having auditory hallucinations recently. States he has been hearing a voice calling his name several times per week including the day he came  to [facility]. Pt reports that he often responds to the voice as it seems real. Pt continues to request admit and would like to be transferred to [facility].    Axis I: 309.81 PTSD, 305.00 Alcohol Abuse, 305.60 Cocaine Abuse Axis II: Deferred Axis III: History reviewed. No pertinent past medical history. Axis IV: other psychosocial or environmental problems and problems related to social environment Axis V: 31-40 impairment in reality testing  Past Medical History: History reviewed. No pertinent past medical history.  Past Surgical History  Procedure Date  . Thyroid surgery     Family History: History reviewed. No pertinent  family history.  Social History:  reports that he has been smoking Cigarettes.  He does not have any smokeless tobacco history on file. He reports that he drinks alcohol. He reports that he uses illicit drugs (Cocaine).  Additional Social History:  Alcohol / Drug Use Pain Medications: pt denies Prescriptions: pt denies Over the Counter: pt denies History of alcohol / drug use?: Yes Longest period of sobriety (when/how long): unknown Negative Consequences of Use: Personal relationships;Financial Withdrawal Symptoms:  (pt denies) Substance #1 Name of Substance 1: crack cocaine 1 - Age of First Use: 40's 1 - Amount (size/oz): $300 1 - Frequency: 1-2x month 1 - Duration: 6-7 months 1 - Last Use / Amount: 12/5, $300 Substance #2 Name of Substance 2: alcohol 2 - Age of First Use: 16 2 - Amount (size/oz): 6 pack 2 - Frequency: 1-2x week 2 - Duration: 6-7 months 2 - Last Use / Amount: 12/4, 6 pack  CIWA: CIWA-Ar BP: 104/56 mmHg Pulse Rate: 57  Nausea and Vomiting: no nausea and no vomiting Tactile Disturbances: none Tremor: no tremor Auditory Disturbances: not present Paroxysmal Sweats: no sweat visible Visual Disturbances: not present Anxiety: mildly anxious Headache, Fullness in Head: none present Agitation: normal activity Orientation and Clouding of Sensorium: oriented and can do serial additions CIWA-Ar Total: 1  COWS: Clinical Opiate Withdrawal Scale (COWS) Resting Pulse Rate: Pulse Rate 80 or below Sweating: No report of chills or flushing Restlessness: Able to sit still Pupil Size: Pupils pinned or normal size for room light Bone or Joint Aches: Not present Runny Nose or Tearing: Not present GI Upset: No GI symptoms Tremor: No tremor Yawning: No yawning Anxiety or Irritability: None Gooseflesh Skin: Skin is smooth COWS Total Score: 0   Allergies: No Known Allergies  Home Medications:  (Not in a hospital admission)  OB/GYN Status:  No LMP for male  patient.  General Assessment Data Location of Assessment: [facility] ED ACT Assessment: Yes Living Arrangements: Other relatives Can pt return to current living arrangement?: Yes Admission Status: Involuntary Is patient capable of signing voluntary admission?: Yes Transfer from: Acute Hospital Referral Source: Self/Family/Friend  Education Status Is patient currently in school?: No  Risk to self Suicidal Ideation: No-Not Currently/Within Last 6 Months Suicidal Intent: No-Not Currently/Within Last 6 Months Is patient at risk for suicide?: Yes Suicidal Plan?: No Access to Means: No What has been your use of drugs/alcohol within the last 12 months?: current use of crack and alcohol Previous Attempts/Gestures: No How many times?: 0  Other Self Harm Risks: pt denies Triggers for Past Attempts: None known Intentional Self Injurious Behavior: None Family Suicide History: No Recent stressful life event(s): Other (Comment) (SA issues, "going nowhere in life," flashbacks) Persecutory voices/beliefs?: No Depression: Yes Depression Symptoms: Despondent;Insomnia;Tearfulness;Isolating;Fatigue;Feeling worthless/self pity;Feeling angry/irritable Substance abuse history and/or treatment for substance abuse?: Yes Suicide prevention information given to non-admitted patients: Not  applicable  Risk to Others Homicidal Ideation: No Thoughts of Harm to Others: No Current Homicidal Intent: No Current Homicidal Plan: No Access to Homicidal Means: No Identified Victim: pt denies History of harm to others?: No Assessment of Violence: None Noted Violent Behavior Description: na - pt calm, cooperative Does patient have access to weapons?: No Criminal Charges Pending?: No Does patient have a court date: No  Psychosis Hallucinations: Auditory;Visual (pt reports hears a voice several times per week, flashbacks) Delusions: None noted  Mental Status Report Appear/Hygiene: Other (Comment) (casual in  scrubs) Eye Contact: Good Motor Activity: Unremarkable Speech: Logical/coherent Level of Consciousness: Alert Mood: Depressed Affect: Appropriate to circumstance Anxiety Level: Minimal Thought Processes: Coherent;Relevant Judgement: Unimpaired Orientation: Person;Place;Time;Situation Obsessive Compulsive Thoughts/Behaviors: None  Cognitive Functioning Concentration: Normal Memory: Recent Intact;Remote Intact IQ: Average Insight: Fair Impulse Control: Fair Appetite: Poor Weight Loss: 40  Weight Gain: 0  Sleep: Decreased Total Hours of Sleep: 4  Vegetative Symptoms: None  ADLScreening [facility]) Patient's cognitive ability adequate to safely complete daily activities?: Yes Patient able to express need for assistance with ADLs?: Yes Independently performs ADLs?: Yes (appropriate for developmental age)  Abuse/Neglect [facility]) Physical Abuse: Denies Verbal Abuse: Denies Sexual Abuse: Denies  Prior Inpatient Therapy Prior Inpatient Therapy: No Prior Therapy Dates: na Prior Therapy Facilty/Provider(s): na Reason for Treatment: na  Prior Outpatient Therapy Prior Outpatient Therapy: No Prior Therapy Dates: na Prior Therapy Facilty/Provider(s): na Reason for Treatment: na  ADL Screening (condition at time of admission) Patient's cognitive ability adequate to safely complete daily activities?: Yes Patient able to express need for assistance with ADLs?: Yes Independently performs ADLs?: Yes (appropriate for developmental age) Weakness of Legs: None Weakness of Arms/Hands: None  Home Assistive Devices/Equipment Home Assistive Devices/Equipment: None    Abuse/Neglect Assessment (Assessment to be complete while patient is alone) Physical Abuse: Denies Verbal Abuse: Denies Sexual Abuse: Denies Exploitation of patient/patient's resources: Denies Self-Neglect: Denies Values / Beliefs Cultural Requests During Hospitalization: None Spiritual Requests During  Hospitalization: None Consults Spiritual Care Consult Needed: No Social Work Consult Needed: No Merchant navy officer (For Healthcare) Advance Directive: Patient does not have advance directive;Patient would not like information Nutrition Screen- [facility] Adult/WL/AP Patient's home diet: Regular Have you recently lost weight without trying?: Yes If yes, how much weight have you lost?: 34 lb or more Have you been eating poorly because of a decreased appetite?: Yes Malnutrition Screening Tool Score: 5   Additional Information 1:1 In Past 12 Months?: No CIRT Risk: No Elopement Risk: No Does patient have medical clearance?: Yes     Disposition:  Disposition Disposition of Patient: Referred to;Inpatient treatment program Type of inpatient treatment program: Adult Patient referred to: Other (Comment) (Pending [facility])  On Site Evaluation by:   Reviewed with Physician:     Caryl Comes 02/24/2012 9:09 AM

## 2015-03-16 DIAGNOSIS — D126 Benign neoplasm of colon, unspecified: Secondary | ICD-10-CM

## 2015-03-16 HISTORY — DX: Benign neoplasm of colon, unspecified: D12.6

## 2015-08-10 ENCOUNTER — Inpatient Hospital Stay (HOSPITAL_COMMUNITY)
Admission: EM | Admit: 2015-08-10 | Discharge: 2015-08-12 | DRG: 194 | Disposition: A | Discharge status: Home / Self Care | Payer: Medicare Other | Attending: Internal Medicine | Admitting: Internal Medicine

## 2015-08-10 ENCOUNTER — Emergency Department (HOSPITAL_COMMUNITY): Payer: Medicare Other

## 2015-08-10 ENCOUNTER — Encounter (HOSPITAL_COMMUNITY): Payer: Self-pay | Admitting: *Deleted

## 2015-08-10 DIAGNOSIS — J189 Pneumonia, unspecified organism: Secondary | ICD-10-CM | POA: Diagnosis not present

## 2015-08-10 DIAGNOSIS — R030 Elevated blood-pressure reading, without diagnosis of hypertension: Secondary | ICD-10-CM | POA: Diagnosis not present

## 2015-08-10 DIAGNOSIS — R600 Localized edema: Secondary | ICD-10-CM

## 2015-08-10 DIAGNOSIS — E669 Obesity, unspecified: Secondary | ICD-10-CM | POA: Diagnosis present

## 2015-08-10 DIAGNOSIS — Z6841 Body Mass Index (BMI) 40.0 and over, adult: Secondary | ICD-10-CM

## 2015-08-10 DIAGNOSIS — IMO0001 Reserved for inherently not codable concepts without codable children: Secondary | ICD-10-CM | POA: Diagnosis present

## 2015-08-10 DIAGNOSIS — F17200 Nicotine dependence, unspecified, uncomplicated: Secondary | ICD-10-CM | POA: Diagnosis not present

## 2015-08-10 DIAGNOSIS — R0602 Shortness of breath: Secondary | ICD-10-CM | POA: Diagnosis not present

## 2015-08-10 DIAGNOSIS — F17210 Nicotine dependence, cigarettes, uncomplicated: Secondary | ICD-10-CM | POA: Diagnosis present

## 2015-08-10 DIAGNOSIS — R0902 Hypoxemia: Secondary | ICD-10-CM | POA: Diagnosis present

## 2015-08-10 LAB — CBC WITH DIFFERENTIAL/PLATELET
BASOS PCT: 0 %
Basophils Absolute: 0 10*3/uL (ref 0.0–0.1)
EOS ABS: 0.2 10*3/uL (ref 0.0–0.7)
EOS PCT: 3 %
HCT: 43.7 % (ref 39.0–52.0)
Hemoglobin: 14.2 g/dL (ref 13.0–17.0)
Lymphocytes Relative: 36 %
Lymphs Abs: 2.2 10*3/uL (ref 0.7–4.0)
MCH: 29.5 pg (ref 26.0–34.0)
MCHC: 32.5 g/dL (ref 30.0–36.0)
MCV: 90.7 fL (ref 78.0–100.0)
MONO ABS: 0.5 10*3/uL (ref 0.1–1.0)
MONOS PCT: 8 %
Neutro Abs: 3.3 10*3/uL (ref 1.7–7.7)
Neutrophils Relative %: 53 %
Platelets: 183 10*3/uL (ref 150–400)
RBC: 4.82 MIL/uL (ref 4.22–5.81)
RDW: 15.4 % (ref 11.5–15.5)
WBC: 6.1 10*3/uL (ref 4.0–10.5)

## 2015-08-10 LAB — BASIC METABOLIC PANEL
Anion gap: 8 (ref 5–15)
BUN: 13 mg/dL (ref 6–20)
CALCIUM: 8.7 mg/dL — AB (ref 8.9–10.3)
CO2: 22 mmol/L (ref 22–32)
CREATININE: 1.2 mg/dL (ref 0.61–1.24)
Chloride: 107 mmol/L (ref 101–111)
GFR calc non Af Amer: >60 mL/min (ref >60)
Glucose, Bld: 95 mg/dL (ref 65–99)
Potassium: 3.9 mmol/L (ref 3.5–5.1)
SODIUM: 137 mmol/L (ref 135–145)

## 2015-08-10 LAB — I-STAT CG4 LACTIC ACID, ED: Lactic Acid, Venous: 2.47 mmol/L (ref 0.5–2.0)

## 2015-08-10 MED ORDER — ALBUTEROL (5 MG/ML) CONTINUOUS INHALATION SOLN
INHALATION_SOLUTION | RESPIRATORY_TRACT | Status: AC
  Filled 2015-08-10: qty 20

## 2015-08-10 MED ORDER — ALBUTEROL SULFATE (2.5 MG/3ML) 0.083% IN NEBU
5.0000 mg | INHALATION_SOLUTION | Freq: Once | RESPIRATORY_TRACT | Status: AC
  Administered 2015-08-10: 5 mg via RESPIRATORY_TRACT

## 2015-08-10 MED ORDER — DEXTROSE 5 % IV SOLN
1.0000 g | Freq: Once | INTRAVENOUS | Status: AC
  Administered 2015-08-10: 1 g via INTRAVENOUS
  Filled 2015-08-10: qty 10

## 2015-08-10 MED ORDER — METHYLPREDNISOLONE SODIUM SUCC 125 MG IJ SOLR
125.0000 mg | Freq: Once | INTRAMUSCULAR | Status: AC
Start: 2015-08-10 — End: 2015-08-10
  Administered 2015-08-10: 125 mg via INTRAVENOUS
  Filled 2015-08-10: qty 2

## 2015-08-10 MED ORDER — ALBUTEROL SULFATE (2.5 MG/3ML) 0.083% IN NEBU
5.0000 mg | INHALATION_SOLUTION | Freq: Once | RESPIRATORY_TRACT | Status: AC
  Administered 2015-08-10: 5 mg via RESPIRATORY_TRACT
  Filled 2015-08-10: qty 6

## 2015-08-10 MED ORDER — ALBUTEROL SULFATE (2.5 MG/3ML) 0.083% IN NEBU: 2.5000 mg | INHALATION_SOLUTION | RESPIRATORY_TRACT | Status: AC | PRN

## 2015-08-10 MED ORDER — AZITHROMYCIN 250 MG PO TABS
500.0000 mg | ORAL_TABLET | Freq: Once | ORAL | Status: AC
  Administered 2015-08-10: 500 mg via ORAL
  Filled 2015-08-10: qty 2

## 2015-08-10 NOTE — ED Notes (Signed)
Per pt, states has had a cold for past couple of days-states he was at dinner and couldn't catch his breath-states chest pain-no history

## 2015-08-10 NOTE — ED Provider Notes (Signed)
CSN: [id number]     Arrival date & time 08/10/15  1904 History   First MD Initiated Contact with Patient 08/10/15 1914     Chief Complaint  Patient presents with  . Shortness of Breath      HPI  Patient presents with increasing shortness of breath and cough for the last 2-3 days.  Patient is a smoker.  Cough has been nonproductive.  Denies fever chills.       No past medical history on file. Past Surgical History  Procedure Laterality Date  . Thyroid surgery     No family history on file. Social History  Substance Use Topics  . Smoking status: Current Every Day Smoker    Types: Cigarettes  . Smokeless tobacco: Not on file  . Alcohol Use: Yes     Comment: occasiona;    Review of Systems  Unable to perform ROS: Acuity of condition      Allergies  Review of patient's allergies indicates no known allergies.  Home Medications   Prior to Admission medications   Medication Sig Start Date End Date Taking? Authorizing Provider  pantoprazole (PROTONIX) 20 MG tablet Take 20 mg by mouth daily.   Yes Historical Provider, MD   BP 150/65 mmHg  Pulse 83  Temp(Src) 97.9 F (36.6 C) (Oral)  Resp 19  SpO2 92% Physical Exam  Constitutional: He is oriented to person, place, and time. He appears well-developed and well-nourished. No distress.  HENT:  Head: Normocephalic and atraumatic.  Eyes: Pupils are equal, round, and reactive to light.  Neck: Normal range of motion.  Cardiovascular: Normal rate and intact distal pulses.   Pulmonary/Chest: No respiratory distress. He has wheezes.  Abdominal: Normal appearance. He exhibits no distension. There is no tenderness. There is no rebound.  Musculoskeletal: Normal range of motion.  Neurological: He is alert and oriented to person, place, and time. No cranial nerve deficit.  Skin: Skin is warm and dry. No rash noted.  Psychiatric: He has a normal mood and affect. His behavior is normal.  Nursing note and vitals reviewed.   ED  Course  Procedures (including critical care time) Medications  albuterol (PROVENTIL, VENTOLIN) (5 MG/ML) 0.5% continuous inhalation solution (  Not Given 08/10/15 2022)  azithromycin (ZITHROMAX) tablet 500 mg (not administered)  albuterol (PROVENTIL) (2.5 MG/3ML) 0.083% nebulizer solution 5 mg (5 mg Nebulization Given 08/10/15 1918)  methylPREDNISolone sodium succinate (SOLU-MEDROL) 125 mg/2 mL injection 125 mg (125 mg Intravenous Given 08/10/15 1931)  albuterol (PROVENTIL) (2.5 MG/3ML) 0.083% nebulizer solution 5 mg (5 mg Nebulization Given 08/10/15 2034)  cefTRIAXone (ROCEPHIN) 1 g in dextrose 5 % 50 mL IVPB (0 g Intravenous Stopped 08/10/15 2117)    Labs Review Labs Reviewed  BASIC METABOLIC PANEL - Abnormal; Notable for the following:    Calcium 8.7 (*)    All other components within normal limits  I-STAT CG4 LACTIC ACID, ED - Abnormal; Notable for the following:    Lactic Acid, Venous 2.47 (*)    All other components within normal limits  CULTURE, BLOOD (ROUTINE X 2)  CULTURE, BLOOD (ROUTINE X 2)  CBC WITH DIFFERENTIAL/PLATELET    Imaging Review Dg Chest 2 View  08/10/2015  CLINICAL DATA:  Cold both last couple of days.  Chest pain. EXAM: CHEST  2 VIEW COMPARISON:  None. FINDINGS: Hazy right infrahilar airspace disease concerning for atelectasis versus pneumonia. There is no pleural effusion or pneumothorax. The heart and mediastinal contours are unremarkable. The osseous structures are unremarkable. IMPRESSION:  Hazy right infrahilar airspace disease concerning for atelectasis versus pneumonia. Electronically Signed   By: Kathreen Devoid   On: 08/10/2015 19:49   I have personally reviewed and evaluated these images and lab results as part of my medical decision-making.   EKG Interpretation   Date/Time:  Sunday Aug 10 2015 19:05:05 EDT Ventricular Rate:  80 PR Interval:  160 QRS Duration: 112 QT Interval:  360 QTC Calculation: 415 R Axis:   69 Text Interpretation:  Sinus rhythm  Probable left atrial enlargement  Borderline intraventricular conduction delay No significant change since  last tracing Confirmed by Justinian Miano  MD, Wadie Liew ([id number]) on 08/10/2015 7:21:11  PM      MDM   Final diagnoses:  Community acquired pneumonia        Leonard Schwartz, MD 08/10/15 2234

## 2015-08-10 NOTE — ED Notes (Signed)
Lactic Acid= 2.47, MD Audie Pinto notified

## 2015-08-10 NOTE — ED Notes (Signed)
Patient transported to X-ray 

## 2015-08-10 NOTE — ED Notes (Signed)
MD at bedside. 

## 2015-08-10 NOTE — ED Notes (Signed)
Patient ready for transport.

## 2015-08-10 NOTE — H&P (Signed)
[facility] Admission History and Physical       Larry Richmond [id number] DOB: 08-29-1950 DOA: 08/10/2015  Referring physician: EDP PCP:   [facility] [place] [facility]:   Chief Complaint:  Cough and SOB  HPI: Larry Richmond is a 65 y.o. male with no reported Medical history other than a Tobacco history who presents to the ED with complaints of SOB and worsening Cough over he past 2 weeks.  He reports havign subjective fevers  And chills and coughing up yellow sputum.    He was found to have mild hypoxia in the ED and was placed on 2 liters NCO2, and administered IV Solumedrol, and Nebulizer Treatments.   His Chest X-ray was suggestive of Pneumonia , so he was placed on IV Antibiotic Rx for CAP and referred for admission.    He reports not smoking for the past 2 weeks due to his symptoms.       Review of Systems:  Constitutional: No Weight Loss, No Weight Gain, Night Sweats, Fevers, Chills, Dizziness, Light Headedness, Fatigue, or Generalized Weakness HEENT: No Headaches, Difficulty Swallowing,Tooth/Dental Problems,Sore Throat,  No Sneezing, Rhinitis, Ear Ache, Nasal Congestion, or Post Nasal Drip,  Cardio-vascular:  No Chest pain, Orthopnea, PND, Edema in Lower Extremities, Anasarca, Dizziness, Palpitations  Resp:  +Dyspnea, No DOE, +Productive Cough, No Hemoptysis, No Wheezing.    GI: No Heartburn, Indigestion, Abdominal Pain, Nausea, Vomiting, Diarrhea, Constipation, Hematemesis, Hematochezia, Melena, Change in Bowel Habits,  Loss of Appetite  GU: No Dysuria, No Change in Color of Urine, No Urgency or Urinary Frequency, No Flank pain.  Musculoskeletal: No Joint Pain or Swelling, No Decreased Range of Motion, No Back Pain.  Neurologic: No Syncope, No Seizures, Muscle Weakness, Paresthesia, Vision Disturbance or Loss, No Diplopia, No Vertigo, No Difficulty Walking,  Skin: No Rash or Lesions. Psych: No Change in Mood or Affect, No Depression or Anxiety, No Memory loss, No  Confusion, or Hallucinations   No past medical history on file.   Past Surgical History  Procedure Laterality Date  . Thyroid surgery        Prior to Admission medications   Medication Sig Start Date End Date Taking? Authorizing Provider  pantoprazole (PROTONIX) 20 MG tablet Take 20 mg by mouth daily.   Yes Historical Provider, MD     No Known Allergies  Social History:  reports that he has been smoking Cigarettes.  He does not have any smokeless tobacco history on file. He reports that he drinks alcohol. He reports that he uses illicit drugs (Cocaine).    No family history on file.     Physical Exam:  GEN:   Pleasant Obese 65 y.o. African American male examined and in no acute distress; cooperative with exam Filed Vitals:   08/10/15 2145 08/10/15 2215 08/10/15 2236 08/10/15 2245  BP: 145/66 136/59 143/78 144/77  Pulse: 83 79 79 79  Temp:      TempSrc:      Resp: 25 19 23 27   SpO2: 91% 96% 95% 96%   Blood pressure 144/77, pulse 79, temperature 97.9 F (36.6 C), temperature source Oral, resp. rate 27, SpO2 96 %. PSYCH: He is alert and oriented x4; does not appear anxious does not appear depressed; affect is normal HEENT: Normocephalic and Atraumatic, Mucous membranes pink; PERRLA; EOM intact; Fundi:  Benign;  No scleral icterus, Nares: Patent, Oropharynx: Clear, Fair Dentition,    Neck:  FROM, No Cervical Lymphadenopathy nor Thyromegaly or Carotid Bruit; No JVD; Breasts:: Not  examined CHEST WALL: No tenderness CHEST: Normal respiration, clear to auscultation bilaterally HEART: Regular rate and rhythm; no murmurs rubs or gallops BACK: No kyphosis or scoliosis; No CVA tenderness ABDOMEN: Positive Bowel Sounds, Obese, Soft Non-Tender, No Rebound or Guarding; No Masses, No Organomegaly, No Pannus. Rectal Exam: Not done EXTREMITIES: No Cyanosis, Clubbing, 2-3+ Edema; No Ulcerations. Genitalia: not examined PULSES: 2+ and symmetric SKIN: Normal hydration no rash or  ulceration CNS:  Alert and Oriented x 4, No Focal Deficits Vascular: pulses palpable throughout    Labs on Admission:  Basic Metabolic Panel:  Recent Labs Lab 08/10/15 1920  NA 137  K 3.9  CL 107  CO2 22  GLUCOSE 95  BUN 13  CREATININE 1.20  CALCIUM 8.7*   Liver Function Tests: No results for input(s): AST, ALT, ALKPHOS, BILITOT, PROT, ALBUMIN in the last 168 hours. No results for input(s): LIPASE, AMYLASE in the last 168 hours. No results for input(s): AMMONIA in the last 168 hours. CBC:  Recent Labs Lab 08/10/15 1920  WBC 6.1  NEUTROABS 3.3  HGB 14.2  HCT 43.7  MCV 90.7  PLT 183   Cardiac Enzymes: No results for input(s): CKTOTAL, CKMB, CKMBINDEX, TROPONINI in the last 168 hours.  BNP (last 3 results) No results for input(s): BNP in the last 8760 hours.  ProBNP (last 3 results) No results for input(s): PROBNP in the last 8760 hours.  CBG: No results for input(s): GLUCAP in the last 168 hours.  Radiological Exams on Admission: Dg Chest 2 View  08/10/2015  CLINICAL DATA:  Cold both last couple of days.  Chest pain. EXAM: CHEST  2 VIEW COMPARISON:  None. FINDINGS: Hazy right infrahilar airspace disease concerning for atelectasis versus pneumonia. There is no pleural effusion or pneumothorax. The heart and mediastinal contours are unremarkable. The osseous structures are unremarkable. IMPRESSION: Hazy right infrahilar airspace disease concerning for atelectasis versus pneumonia. Electronically Signed   By: Kathreen Devoid   On: 08/10/2015 19:49     Assessment/Plan:      65 y.o. male with   Principal Problem:    CAP (community acquired pneumonia)   IV Rocephin and IV Azithromycin   Albuterol Nebs   IV Solumedrol x 1   O2 PRN   Monitor O2 sats   Active Problems:    Elevated blood pressure- Probably undiagnosed EHTN   Monitor      Lower leg edema- Evaluate for CHF   2D ECHO in AM         Tobacco use disorder   Counseled   Continue Tobacco  Cessation    DVT Prophylaxis   Lovenox    Code Status:     FULL CODE  Family Communication:   Wife at Bedside    Disposition Plan:   Observation Status        Time spent: 34 Minutes      Theressa Millard [facility] Pager [phone]   If 7AM -7PM Please Contact the Day Rounding Team MD for [facility]  If 7PM-7AM, Please Contact Night-Floor Coverage  [email or web address] Password [facility] 08/10/2015, 11:03 PM     ADDENDUM:   Patient was seen and examined on 08/10/2015

## 2015-08-11 DIAGNOSIS — R030 Elevated blood-pressure reading, without diagnosis of hypertension: Secondary | ICD-10-CM

## 2015-08-11 DIAGNOSIS — F17200 Nicotine dependence, unspecified, uncomplicated: Secondary | ICD-10-CM

## 2015-08-11 DIAGNOSIS — R0902 Hypoxemia: Secondary | ICD-10-CM | POA: Diagnosis present

## 2015-08-11 DIAGNOSIS — E669 Obesity, unspecified: Secondary | ICD-10-CM | POA: Diagnosis present

## 2015-08-11 DIAGNOSIS — J189 Pneumonia, unspecified organism: Principal | ICD-10-CM

## 2015-08-11 DIAGNOSIS — R0602 Shortness of breath: Secondary | ICD-10-CM | POA: Diagnosis present

## 2015-08-11 DIAGNOSIS — Z6841 Body Mass Index (BMI) 40.0 and over, adult: Secondary | ICD-10-CM | POA: Diagnosis not present

## 2015-08-11 DIAGNOSIS — R600 Localized edema: Secondary | ICD-10-CM

## 2015-08-11 DIAGNOSIS — F17210 Nicotine dependence, cigarettes, uncomplicated: Secondary | ICD-10-CM | POA: Diagnosis present

## 2015-08-11 LAB — CBC
HCT: 40.4 % (ref 39.0–52.0)
HEMOGLOBIN: 13 g/dL (ref 13.0–17.0)
MCH: 29.3 pg (ref 26.0–34.0)
MCHC: 32.2 g/dL (ref 30.0–36.0)
MCV: 91 fL (ref 78.0–100.0)
PLATELETS: 157 10*3/uL (ref 150–400)
RBC: 4.44 MIL/uL (ref 4.22–5.81)
RDW: 15.3 % (ref 11.5–15.5)
WBC: 5.7 10*3/uL (ref 4.0–10.5)

## 2015-08-11 LAB — BASIC METABOLIC PANEL
Anion gap: 8 (ref 5–15)
BUN: 15 mg/dL (ref 6–20)
CALCIUM: 8.8 mg/dL — AB (ref 8.9–10.3)
CHLORIDE: 105 mmol/L (ref 101–111)
CO2: 23 mmol/L (ref 22–32)
CREATININE: 1.27 mg/dL — AB (ref 0.61–1.24)
GFR calc Af Amer: >60 mL/min (ref >60)
GFR, EST NON AFRICAN AMERICAN: 57 mL/min — AB (ref >60)
Glucose, Bld: 180 mg/dL — ABNORMAL HIGH (ref 65–99)
POTASSIUM: 4.5 mmol/L (ref 3.5–5.1)
Sodium: 136 mmol/L (ref 135–145)

## 2015-08-11 LAB — STREP PNEUMONIAE URINARY ANTIGEN: STREP PNEUMO URINARY ANTIGEN: NEGATIVE

## 2015-08-11 LAB — MRSA PCR SCREENING: MRSA by PCR: NEGATIVE

## 2015-08-11 LAB — GLUCOSE, CAPILLARY: GLUCOSE-CAPILLARY: 165 mg/dL — AB (ref 65–99)

## 2015-08-11 LAB — BRAIN NATRIURETIC PEPTIDE: B NATRIURETIC PEPTIDE 5: 13 pg/mL (ref 0.0–100.0)

## 2015-08-11 MED ORDER — DEXTROSE 5 % IV SOLN
1.0000 g | INTRAVENOUS | Status: DC
  Administered 2015-08-11: 1 g via INTRAVENOUS
  Filled 2015-08-11: qty 10

## 2015-08-11 MED ORDER — SODIUM CHLORIDE 0.9 % IV SOLN: 250.0000 mL | INTRAVENOUS | Status: DC | PRN

## 2015-08-11 MED ORDER — GUAIFENESIN-DM 100-10 MG/5ML PO SYRP
5.0000 mL | ORAL_SOLUTION | ORAL | Status: DC | PRN
  Administered 2015-08-11 (×2): 5 mL via ORAL
  Filled 2015-08-11 (×2): qty 10

## 2015-08-11 MED ORDER — GUAIFENESIN ER 600 MG PO TB12
1200.0000 mg | ORAL_TABLET | Freq: Two times a day (BID) | ORAL | Status: DC
  Administered 2015-08-11 – 2015-08-12 (×3): 1200 mg via ORAL
  Filled 2015-08-11 (×4): qty 2

## 2015-08-11 MED ORDER — ACETAMINOPHEN 325 MG PO TABS: 650.0000 mg | ORAL_TABLET | Freq: Four times a day (QID) | ORAL | Status: DC | PRN

## 2015-08-11 MED ORDER — SODIUM CHLORIDE 0.9% FLUSH: 3.0000 mL | INTRAVENOUS | Status: DC | PRN

## 2015-08-11 MED ORDER — HYDROMORPHONE HCL 1 MG/ML IJ SOLN
0.5000 mg | INTRAMUSCULAR | Status: DC | PRN
Start: 2015-08-11 — End: 2015-08-12

## 2015-08-11 MED ORDER — DEXTROSE 5 % IV SOLN: 500.0000 mg | INTRAVENOUS | Status: DC

## 2015-08-11 MED ORDER — OXYCODONE HCL 5 MG PO TABS
5.0000 mg | ORAL_TABLET | ORAL | Status: DC | PRN
Start: 2015-08-11 — End: 2015-08-12

## 2015-08-11 MED ORDER — PANTOPRAZOLE SODIUM 20 MG PO TBEC
20.0000 mg | DELAYED_RELEASE_TABLET | Freq: Every day | ORAL | Status: DC
  Administered 2015-08-11 – 2015-08-12 (×2): 20 mg via ORAL
  Filled 2015-08-11 (×2): qty 1

## 2015-08-11 MED ORDER — ONDANSETRON HCL 4 MG PO TABS: 4.0000 mg | ORAL_TABLET | Freq: Four times a day (QID) | ORAL | Status: DC | PRN

## 2015-08-11 MED ORDER — ACETAMINOPHEN 650 MG RE SUPP: 650.0000 mg | Freq: Four times a day (QID) | RECTAL | Status: DC | PRN

## 2015-08-11 MED ORDER — SODIUM CHLORIDE 0.9% FLUSH
3.0000 mL | Freq: Two times a day (BID) | INTRAVENOUS | Status: DC
  Administered 2015-08-11 – 2015-08-12 (×3): 3 mL via INTRAVENOUS

## 2015-08-11 MED ORDER — ONDANSETRON HCL 4 MG/2ML IJ SOLN: 4.0000 mg | Freq: Four times a day (QID) | INTRAMUSCULAR | Status: DC | PRN

## 2015-08-11 MED ORDER — ENOXAPARIN SODIUM 40 MG/0.4ML ~~LOC~~ SOLN
40.0000 mg | SUBCUTANEOUS | Status: DC
  Administered 2015-08-11 – 2015-08-12 (×2): 40 mg via SUBCUTANEOUS
  Filled 2015-08-11 (×2): qty 0.4

## 2015-08-11 MED ORDER — ALBUTEROL SULFATE (2.5 MG/3ML) 0.083% IN NEBU
2.5000 mg | INHALATION_SOLUTION | Freq: Four times a day (QID) | RESPIRATORY_TRACT | Status: DC
  Administered 2015-08-11 (×4): 2.5 mg via RESPIRATORY_TRACT
  Filled 2015-08-11 (×4): qty 3

## 2015-08-11 NOTE — Progress Notes (Signed)
PROGRESS NOTE  Larry Richmond  [id number] DOB: Sep 12, 1950 DOA: 08/10/2015 PCP: Default, Provider, MD Outpatient Specialists:  Subjective: Feels better, still has SOB and cough  Brief Narrative:  Larry Richmond is a 65 y.o. male with no reported Medical history other than a Tobacco history who presents to the ED with complaints of SOB and worsening Cough over he past 2 weeks. He reports havign subjective fevers And chills and coughing up yellow sputum. He was found to have mild hypoxia in the ED and was placed on 2 liters NCO2, and administered IV Solumedrol, and Nebulizer Treatments. His Chest X-ray was suggestive of Pneumonia , so he was placed on IV Antibiotic Rx for CAP and referred for admission. He reports not smoking for the past 2 weeks due to his symptoms.   Assessment & Plan:   Principal Problem:   CAP (community acquired pneumonia) Active Problems:   Elevated blood pressure   Lower leg edema   Tobacco use disorder   Community acquired pneumonia Patient presented with cough, shortness of breath and yellow sputum, chest x-ray suggestive of pneumonia. Started on IV Rocephin and azithromycin, will continue. Continue supportive management with bronchodilators, mucolytics, antitussives and oxygen as needed.  Lower extremity edema Reported frequent lower extremity edema, check 2-D echo. Has BNP of 13, follow clinically.  Tobacco abuse disorder Patient counseled extensively about smoking cessation.   DVT prophylaxis:  Code Status: Full Code Family Communication:  Disposition Plan:  Diet: Diet Heart Room service appropriate?: Yes; Fluid consistency:: Thin  Consultants:   None  Procedures:   None  Antimicrobials:   Rocephin and azithromycin   Objective: Filed Vitals:   08/10/15 2236 08/10/15 2245 08/10/15 2328 08/11/15 0409  BP: 143/78 144/77 163/75 136/65  Pulse: 79 79 76 67  Temp:   97.8 F (36.6 C) 97.5 F (36.4 C)  TempSrc:   Oral Oral    Resp: 23 27 24    Height:   6\' 2"  (1.88 m)   Weight:   147.3 kg (324 lb 11.8 oz)   SpO2: 95% 96% 98% 98%    Intake/Output Summary (Last 24 hours) at 08/11/15 0918 Last data filed at 08/11/15 [id number]  Gross per 24 hour  Intake    480 ml  Output    500 ml  Net    -20 ml   Filed Weights   08/10/15 2328  Weight: 147.3 kg (324 lb 11.8 oz)    Examination: General exam: Appears calm and comfortable  Respiratory system: Clear to auscultation. Respiratory effort normal. Cardiovascular system: S1 & S2 heard, RRR. No JVD, murmurs, rubs, gallops or clicks. No pedal edema. Gastrointestinal system: Abdomen is nondistended, soft and nontender. No organomegaly or masses felt. Normal bowel sounds heard. Central nervous system: Alert and oriented. No focal neurological deficits. Extremities: Symmetric 5 x 5 power. Skin: No rashes, lesions or ulcers Psychiatry: Judgement and insight appear normal. Mood & affect appropriate.   Data Reviewed: I have personally reviewed following labs and imaging studies  CBC:  Recent Labs Lab 08/10/15 1920 08/11/15 0449  WBC 6.1 5.7  NEUTROABS 3.3  --   HGB 14.2 13.0  HCT 43.7 40.4  MCV 90.7 91.0  PLT 183 [id number]   Basic Metabolic Panel:  Recent Labs Lab 08/10/15 1920 08/11/15 0449  NA 137 136  K 3.9 4.5  CL 107 105  CO2 22 23  GLUCOSE 95 180*  BUN 13 15  CREATININE 1.20 1.27*  CALCIUM 8.7* 8.8*   GFR: Estimated Creatinine  Clearance: 87.6 mL/min (by C-G formula based on Cr of 1.27). Liver Function Tests: No results for input(s): AST, ALT, ALKPHOS, BILITOT, PROT, ALBUMIN in the last 168 hours. No results for input(s): LIPASE, AMYLASE in the last 168 hours. No results for input(s): AMMONIA in the last 168 hours. Coagulation Profile: No results for input(s): INR, PROTIME in the last 168 hours. Cardiac Enzymes: No results for input(s): CKTOTAL, CKMB, CKMBINDEX, TROPONINI in the last 168 hours. BNP (last 3 results) No results for input(s): PROBNP  in the last 8760 hours. HbA1C: No results for input(s): HGBA1C in the last 72 hours. CBG:  Recent Labs Lab 08/11/15 0751  GLUCAP 165*   Lipid Profile: No results for input(s): CHOL, HDL, LDLCALC, TRIG, CHOLHDL, LDLDIRECT in the last 72 hours. Thyroid Function Tests: No results for input(s): TSH, T4TOTAL, FREET4, T3FREE, THYROIDAB in the last 72 hours. Anemia Panel: No results for input(s): VITAMINB12, FOLATE, FERRITIN, TIBC, IRON, RETICCTPCT in the last 72 hours. Urine analysis: No results found for: COLORURINE, APPEARANCEUR, LABSPEC, PHURINE, GLUCOSEU, HGBUR, BILIRUBINUR, KETONESUR, PROTEINUR, UROBILINOGEN, NITRITE, LEUKOCYTESUR Sepsis Labs: @LABRCNTIP (procalcitonin:4,lacticidven:4)  ) Recent Results (from the past 240 hour(s))  MRSA PCR Screening     Status: None   Collection Time: 08/10/15 11:54 PM  Result Value Ref Range Status   MRSA by PCR NEGATIVE NEGATIVE Final    Comment:        The GeneXpert MRSA Assay (FDA approved for NASAL specimens only), is one component of a comprehensive MRSA colonization surveillance program. It is not intended to diagnose MRSA infection nor to guide or monitor treatment for MRSA infections.      Invalid input(s): PROCALCITONIN, LACTICACIDVEN   Radiology Studies: Dg Chest 2 View  08/10/2015  CLINICAL DATA:  Cold both last couple of days.  Chest pain. EXAM: CHEST  2 VIEW COMPARISON:  None. FINDINGS: Hazy right infrahilar airspace disease concerning for atelectasis versus pneumonia. There is no pleural effusion or pneumothorax. The heart and mediastinal contours are unremarkable. The osseous structures are unremarkable. IMPRESSION: Hazy right infrahilar airspace disease concerning for atelectasis versus pneumonia. Electronically Signed   By: Kathreen Devoid   On: 08/10/2015 19:49        Scheduled Meds: . albuterol  2.5 mg Nebulization Q6H  . [START ON 08/12/2015] azithromycin  500 mg Intravenous Q24H  . cefTRIAXone (ROCEPHIN)  IV  1  g Intravenous Q24H  . enoxaparin (LOVENOX) injection  40 mg Subcutaneous Q24H  . pantoprazole  20 mg Oral Daily  . sodium chloride flush  3 mL Intravenous Q12H   Continuous Infusions:       Time spent: 35 minutes    Coen Miyasato A, MD [facility] Pager [phone]  If 7PM-7AM, please contact night-coverage [email or web address] Password [email or web address] 08/11/2015, 9:18 AM

## 2015-08-11 NOTE — Care Management Obs Status (Signed)
[facility] NOTIFICATION   Patient Details  Name: Larry Richmond MRN: [id number] Date of Birth: Nov 11, 1950   Medicare Observation Status Notification Given:  Yes    Leeroy Cha, RN 08/11/2015, 12:43 PM

## 2015-08-12 MED ORDER — ALBUTEROL SULFATE (2.5 MG/3ML) 0.083% IN NEBU: 2.5000 mg | INHALATION_SOLUTION | Freq: Four times a day (QID) | RESPIRATORY_TRACT | Status: DC | PRN

## 2015-08-12 MED ORDER — LEVOFLOXACIN 750 MG PO TABS: 750.0000 mg | ORAL_TABLET | Freq: Every day | ORAL | Status: DC

## 2015-08-12 MED ORDER — GUAIFENESIN ER 600 MG PO TB12
1200.0000 mg | ORAL_TABLET | Freq: Two times a day (BID) | ORAL | Status: DC
Start: 2015-08-12 — End: 2016-02-17

## 2015-08-12 MED ORDER — ALBUTEROL SULFATE (2.5 MG/3ML) 0.083% IN NEBU
2.5000 mg | INHALATION_SOLUTION | Freq: Three times a day (TID) | RESPIRATORY_TRACT | Status: DC
  Administered 2015-08-12: 2.5 mg via RESPIRATORY_TRACT
  Filled 2015-08-12: qty 3

## 2015-08-12 NOTE — Progress Notes (Addendum)
[id number]/tcf-daughter/patient states he is unable to pay for his medications/ [facility] [place] [facility] [place]/explained to caller that the medication assistance program is for those you do not have any insurance that will cover medications.  Caller agreeable with this information/Rhonda Davis,BSN,RN3,CCM,[phone].

## 2015-08-12 NOTE — Discharge Summary (Signed)
Physician Discharge Summary  EVO GENDRON [id number] DOB: 1950-06-26 DOA: 08/10/2015  PCP: Default, Provider, MD  Admit date: 08/10/2015 Discharge date: 08/12/2015  Time spent: 40 minutes  Recommendations for Outpatient Follow-up:  1. Follow-up with primary care physician within one week.   Discharge Diagnoses:  Principal Problem:   CAP (community acquired pneumonia) Active Problems:   Elevated blood pressure   Lower leg edema   Tobacco use disorder   Discharge Condition: Stable  Diet recommendation: Heart healthy  Filed Weights   08/10/15 2328  Weight: 147.3 kg (324 lb 11.8 oz)    History of present illness:  Larry Richmond is a 65 y.o. male with no reported Medical history other than a Tobacco history who presents to the ED with complaints of SOB and worsening Cough over he past 2 weeks. He reports havign subjective fevers And chills and coughing up yellow sputum. He was found to have mild hypoxia in the ED and was placed on 2 liters NCO2, and administered IV Solumedrol, and Nebulizer Treatments. His Chest X-ray was suggestive of Pneumonia , so he was placed on IV Antibiotic Rx for CAP and referred for admission. He reports not smoking for the past 2 weeks due to his symptoms.   Hospital Course:   Community acquired pneumonia Patient presented with cough, shortness of breath and yellow sputum, chest x-ray suggestive of pneumonia. Started on IV Rocephin and azithromycin, Continued while he was inpatient Received supportive management with bronchodilators, mucolytics, antitussives and oxygen as needed. Patient did very well, discharged on levofloxacin for 5 more days and Mucinex.  Lower extremity edema Reported frequent lower extremity edema, needs follow-up with PCP might need echo.  Has BNP of 13, given 1 dose of Lasix creatinine 1.27 on discharge.  Tobacco abuse disorder Patient counseled extensively about smoking  cessation.   Procedures:  None  Consultations:  None  Discharge Exam: Filed Vitals:   08/12/15 0615 08/12/15 0957  BP: 134/56 145/82  Pulse: 58 91  Temp: 97.6 F (36.4 C) 97.6 F (36.4 C)  Resp: 20 18  General: Alert and awake, oriented x3, not in any acute distress. HEENT: anicteric sclera, pupils reactive to light and accommodation, EOMI CVS: S1-S2 clear, no murmur rubs or gallops Chest: clear to auscultation bilaterally, no wheezing, rales or rhonchi Abdomen: soft nontender, nondistended, normal bowel sounds, no organomegaly Extremities: no cyanosis, clubbing or edema noted bilaterally Neuro: Cranial nerves II-XII intact, no focal neurological deficits  Discharge Instructions   Discharge Instructions    Diet - low sodium heart healthy    Complete by:  As directed      Increase activity slowly    Complete by:  As directed           Current Discharge Medication List    START taking these medications   Details  guaiFENesin (MUCINEX) 600 MG 12 hr tablet Take 2 tablets (1,200 mg total) by mouth 2 (two) times daily. Qty: 30 tablet, Refills: 0    levofloxacin (LEVAQUIN) 750 MG tablet Take 1 tablet (750 mg total) by mouth daily. Qty: 5 tablet, Refills: 0      CONTINUE these medications which have NOT CHANGED   Details  pantoprazole (PROTONIX) 20 MG tablet Take 20 mg by mouth daily.       No Known Allergies    The results of significant diagnostics from this hospitalization (including imaging, microbiology, ancillary and laboratory) are listed below for reference.    Significant Diagnostic Studies: Dg Chest 2 View  08/10/2015  CLINICAL DATA:  Cold both last couple of days.  Chest pain. EXAM: CHEST  2 VIEW COMPARISON:  None. FINDINGS: Hazy right infrahilar airspace disease concerning for atelectasis versus pneumonia. There is no pleural effusion or pneumothorax. The heart and mediastinal contours are unremarkable. The osseous structures are unremarkable.  IMPRESSION: Hazy right infrahilar airspace disease concerning for atelectasis versus pneumonia. Electronically Signed   By: Kathreen Devoid   On: 08/10/2015 19:49    Microbiology: Recent Results (from the past 240 hour(s))  MRSA PCR Screening     Status: None   Collection Time: 08/10/15 11:54 PM  Result Value Ref Range Status   MRSA by PCR NEGATIVE NEGATIVE Final    Comment:        The GeneXpert MRSA Assay (FDA approved for NASAL specimens only), is one component of a comprehensive MRSA colonization surveillance program. It is not intended to diagnose MRSA infection nor to guide or monitor treatment for MRSA infections.      Labs: Basic Metabolic Panel:  Recent Labs Lab 08/10/15 1920 08/11/15 0449  NA 137 136  K 3.9 4.5  CL 107 105  CO2 22 23  GLUCOSE 95 180*  BUN 13 15  CREATININE 1.20 1.27*  CALCIUM 8.7* 8.8*   Liver Function Tests: No results for input(s): AST, ALT, ALKPHOS, BILITOT, PROT, ALBUMIN in the last 168 hours. No results for input(s): LIPASE, AMYLASE in the last 168 hours. No results for input(s): AMMONIA in the last 168 hours. CBC:  Recent Labs Lab 08/10/15 1920 08/11/15 0449  WBC 6.1 5.7  NEUTROABS 3.3  --   HGB 14.2 13.0  HCT 43.7 40.4  MCV 90.7 91.0  PLT 183 157   Cardiac Enzymes: No results for input(s): CKTOTAL, CKMB, CKMBINDEX, TROPONINI in the last 168 hours. BNP: BNP (last 3 results)  Recent Labs  08/11/15 0449  BNP 13.0    ProBNP (last 3 results) No results for input(s): PROBNP in the last 8760 hours.  CBG:  Recent Labs Lab 08/11/15 0751  GLUCAP 165*       Signed:  Verlee Monte A MD.  [facility] 08/12/2015, 10:12 AM

## 2015-08-12 NOTE — Progress Notes (Signed)
Pt discharged from the unit via wheelchair. Discharge instructions were reviewed with the patient and family members. No questions or concerns from the pt at this time.  [name], RN

## 2015-08-12 NOTE — Progress Notes (Signed)
Date:  Aug 12, 2015 Chart reviewed for concurrent status and case management needs. Will continue to follow the patient for changes and needs:  Discharged to home with no needs Expected discharge date: [id number] Herby Amick, BSN, RN3, CCM   [phone] 

## 2015-08-13 MED FILL — levoFLOXacin 750 MG TABS: 750 | 5 days supply | Qty: 5 | Fill #0

## 2015-08-13 NOTE — Progress Notes (Signed)
Larry Richmond,BSN,RN3,CCM: [facility]cf-V. Hassell Done stating that patient is unable to get medication -[facility] for [facility]/match letter done.  Patient has [place] only and [facility] benefits./M.Hassell Done is agreeable to pick up match letter.

## 2015-08-16 LAB — CULTURE, BLOOD (ROUTINE X 2)
CULTURE: NO GROWTH
Culture: NO GROWTH

## 2016-02-17 ENCOUNTER — Emergency Department (HOSPITAL_COMMUNITY): Payer: Non-veteran care

## 2016-02-17 ENCOUNTER — Encounter (HOSPITAL_COMMUNITY): Payer: Self-pay | Admitting: Emergency Medicine

## 2016-02-17 ENCOUNTER — Emergency Department (HOSPITAL_COMMUNITY)
Admission: EM | Admit: 2016-02-17 | Discharge: 2016-02-17 | Disposition: A | Discharge status: Home / Self Care | Payer: Non-veteran care | Attending: Emergency Medicine | Admitting: Emergency Medicine

## 2016-02-17 DIAGNOSIS — W11XXXA Fall on and from ladder, initial encounter: Secondary | ICD-10-CM | POA: Insufficient documentation

## 2016-02-17 DIAGNOSIS — S0990XA Unspecified injury of head, initial encounter: Secondary | ICD-10-CM | POA: Diagnosis not present

## 2016-02-17 DIAGNOSIS — S3992XA Unspecified injury of lower back, initial encounter: Secondary | ICD-10-CM | POA: Diagnosis present

## 2016-02-17 DIAGNOSIS — Z79899 Other long term (current) drug therapy: Secondary | ICD-10-CM | POA: Diagnosis not present

## 2016-02-17 DIAGNOSIS — Y9389 Activity, other specified: Secondary | ICD-10-CM | POA: Diagnosis not present

## 2016-02-17 DIAGNOSIS — E049 Nontoxic goiter, unspecified: Secondary | ICD-10-CM | POA: Insufficient documentation

## 2016-02-17 DIAGNOSIS — S300XXA Contusion of lower back and pelvis, initial encounter: Secondary | ICD-10-CM | POA: Insufficient documentation

## 2016-02-17 DIAGNOSIS — Y999 Unspecified external cause status: Secondary | ICD-10-CM | POA: Insufficient documentation

## 2016-02-17 DIAGNOSIS — Y92019 Unspecified place in single-family (private) house as the place of occurrence of the external cause: Secondary | ICD-10-CM | POA: Insufficient documentation

## 2016-02-17 DIAGNOSIS — R918 Other nonspecific abnormal finding of lung field: Secondary | ICD-10-CM | POA: Insufficient documentation

## 2016-02-17 DIAGNOSIS — F17210 Nicotine dependence, cigarettes, uncomplicated: Secondary | ICD-10-CM | POA: Diagnosis not present

## 2016-02-17 DIAGNOSIS — W19XXXA Unspecified fall, initial encounter: Secondary | ICD-10-CM

## 2016-02-17 LAB — I-STAT CHEM 8, ED
BUN: 11 mg/dL (ref 6–20)
Calcium, Ion: 1.14 mmol/L — ABNORMAL LOW (ref 1.15–1.40)
Chloride: 107 mmol/L (ref 101–111)
Creatinine, Ser: 1.1 mg/dL (ref 0.61–1.24)
Glucose, Bld: 81 mg/dL (ref 65–99)
HEMATOCRIT: 38 % — AB (ref 39.0–52.0)
HEMOGLOBIN: 12.9 g/dL — AB (ref 13.0–17.0)
Potassium: 4.5 mmol/L (ref 3.5–5.1)
SODIUM: 140 mmol/L (ref 135–145)
TCO2: 23 mmol/L (ref 0–100)

## 2016-02-17 MED ORDER — OXYCODONE-ACETAMINOPHEN 5-325 MG PO TABS: 2.0000 | ORAL_TABLET | ORAL | 0 refills | Status: DC | PRN

## 2016-02-17 MED ORDER — HYDROMORPHONE HCL 2 MG/ML IJ SOLN
1.0000 mg | Freq: Once | INTRAMUSCULAR | Status: AC
  Administered 2016-02-17: 1 mg via INTRAVENOUS
  Filled 2016-02-17: qty 1

## 2016-02-17 MED ORDER — IBUPROFEN 800 MG PO TABS: 800.0000 mg | ORAL_TABLET | Freq: Three times a day (TID) | ORAL | 0 refills | Status: DC

## 2016-02-17 MED ORDER — METHOCARBAMOL 500 MG PO TABS: 500.0000 mg | ORAL_TABLET | Freq: Four times a day (QID) | ORAL | 0 refills | Status: DC

## 2016-02-17 MED ORDER — ONDANSETRON HCL 4 MG/2ML IJ SOLN
4.0000 mg | Freq: Once | INTRAMUSCULAR | Status: AC
  Administered 2016-02-17: 4 mg via INTRAMUSCULAR
  Filled 2016-02-17: qty 2

## 2016-02-17 MED ORDER — IOPAMIDOL (ISOVUE-300) INJECTION 61%
INTRAVENOUS | Status: AC
  Administered 2016-02-17: 100 mL
  Filled 2016-02-17: qty 100

## 2016-02-17 NOTE — ED Notes (Signed)
Pt given a sprite and crackers.

## 2016-02-17 NOTE — ED Notes (Signed)
Pt c/o pain at left shoulder, back of head and right hip and low back.

## 2016-02-17 NOTE — ED Triage Notes (Signed)
Pt sts fall x 8 feet from ladder onto concrete c/o upper back and left shoulder pain; pt sts pain in the back of his head and denies LOC

## 2016-02-17 NOTE — ED Notes (Signed)
Pt ambulated in hall about 5 feet. He used his wife's cane, which he does not normally require to ambulate. He tolerated the walk poorly, complaining of a large amount of pain. -HH

## 2016-02-18 NOTE — ED Provider Notes (Signed)
[facility] DEPT Provider Note   CSN: [id number] Arrival date & time: 02/17/16  1451     History   Chief Complaint Chief Complaint  Patient presents with  . Fall    HPI Larry Richmond is a 65 y.o. male.  Pt reports he fell 8 foot off of a ladder.  Pt reports he is painting a house.  Pt reports he landed on his back.  He hit his head but did not lose consciousness.  Pt reports pain in his low back.  Pt reports soreness in his neck.  Pt denies any injury to his legs.  Pt has soreness in his shoulder.    The history is provided by the patient.  Fall  This is a new problem. The problem occurs constantly. The problem has been gradually worsening. The symptoms are aggravated by walking. Nothing relieves the symptoms. He has tried nothing for the symptoms. The treatment provided moderate relief.    History reviewed. No pertinent past medical history.  Patient Active Problem List   Diagnosis Date Noted  . CAP (community acquired pneumonia) 08/10/2015  . Elevated blood pressure 08/10/2015  . Lower leg edema 08/10/2015  . Tobacco use disorder 08/10/2015    Past Surgical History:  Procedure Laterality Date  . THYROID SURGERY         Home Medications    Prior to Admission medications   Medication Sig Start Date End Date Taking? Authorizing Provider  pantoprazole (PROTONIX) 40 MG tablet Take 40 mg by mouth every evening.   Yes Historical Provider, MD  ibuprofen (ADVIL,MOTRIN) 800 MG tablet Take 1 tablet (800 mg total) by mouth 3 (three) times daily. 02/17/16   Fransico Meadow, PA-C  methocarbamol (ROBAXIN) 500 MG tablet Take 1 tablet (500 mg total) by mouth 4 (four) times daily. 02/17/16   Fransico Meadow, PA-C  oxyCODONE-acetaminophen (PERCOCET/ROXICET) 5-325 MG tablet Take 2 tablets by mouth every 4 (four) hours as needed for severe pain. 02/17/16   Fransico Meadow, PA-C    Family History History reviewed. No pertinent family history.  Social History Social History    Substance Use Topics  . Smoking status: Current Every Day Smoker    Types: Cigarettes  . Smokeless tobacco: Not on file  . Alcohol use Yes     Comment: occasiona;     Allergies   Patient has no known allergies.   Review of Systems Review of Systems  All other systems reviewed and are negative.    Physical Exam Updated Vital Signs BP 119/61   Pulse 64   Temp 97.9 F (36.6 C) (Oral)   Resp 18   Ht 6\' 2"  (1.88 m)   Wt (!) 146.5 kg   SpO2 94%   BMI 41.47 kg/m   Physical Exam  Constitutional: He is oriented to person, place, and time. He appears well-developed and well-nourished.  HENT:  Head: Normocephalic and atraumatic.  Eyes: Conjunctivae and EOM are normal. Pupils are equal, round, and reactive to light.  Neck: Normal range of motion. Neck supple.  Cardiovascular: Normal rate, regular rhythm and normal heart sounds.   Pulmonary/Chest: Effort normal.  Abdominal: Soft.  Musculoskeletal: Normal range of motion.  Neurological: He is alert and oriented to person, place, and time.  Skin: Skin is warm.  Psychiatric: He has a normal mood and affect.  Nursing note and vitals reviewed.    ED Treatments / Results  Labs (all labs ordered are listed, but only abnormal results are displayed) Labs  Reviewed  I-STAT CHEM 8, ED - Abnormal; Notable for the following:       Result Value   Calcium, Ion 1.14 (*)    Hemoglobin 12.9 (*)    HCT 38.0 (*)    All other components within normal limits    EKG  EKG Interpretation None       Radiology Dg Lumbar Spine Complete  Result Date: 02/17/2016 CLINICAL DATA:  65 year old male status post fall from 8 foot ladder today. Pain. Initial encounter. EXAM: LUMBAR SPINE - COMPLETE 4+ VIEW COMPARISON:  None. FINDINGS: Calcified aortic atherosclerosis. Normal lumbar segmentation. Preserved lumbar and visible lower thoracic vertebral height and alignment. Mild for age lumbar disc space loss and endplate spurring. SI joints appear  normal. Visible pelvis appears grossly intact. Mild lumbar facet hypertrophy. IMPRESSION: 1. No acute fracture or listhesis identified in the lumbar spine. 2.  Calcified aortic atherosclerosis. Electronically Signed   By: Genevie Ann M.D.   On: 02/17/2016 17:07   Ct Head Wo Contrast  Result Date: 02/17/2016 CLINICAL DATA:  Pain after falling from an 8 foot-ladder. EXAM: CT HEAD WITHOUT CONTRAST CT CERVICAL SPINE WITHOUT CONTRAST TECHNIQUE: Multidetector CT imaging of the head and cervical spine was performed following the standard protocol without intravenous contrast. Multiplanar CT image reconstructions of the cervical spine were also generated. COMPARISON:  None. FINDINGS: CT HEAD FINDINGS BRAIN: No acute intracranial hemorrhage, midline shift or edema. Chronic small vessel ischemic change periventricular white matter. No intra-axial mass nor extra-axial collections. The ventricles and sulci are age appropriate. Basal cisterns and fourth ventricle are midline. VASCULAR: No acute large vascular territory infarction. SKULL/SOFT TISSUES: No skull fracture. No significant soft tissue swelling. ORBITS/SINUSES: The included ocular globes and orbital contents are normal.The mastoid air-cells and included paranasal sinuses are well-aerated. OTHER: None. CT CERVICAL SPINE FINDINGS ALIGNMENT: There is reversal cervical lordosis possibly muscle spasm or positioning of the patient. The craniocervical relationship is intact. The atlantodental interval is maintained. SKULL BASE AND VERTEBRAE: Cervical vertebral bodies and posterior elements are intact. No destructive bony lesions. C1-2 articulation maintained. SOFT TISSUES AND SPINAL CANAL: No intraspinal hemorrhage. Prevertebral soft tissue swelling. DISC LEVELS: Mild osseous canal stenosis at C4-5 and C5-6. There is slight disc space narrowing from C4 through C7 with small posterior marginal osteophytes contributing to bilateral neural foraminal narrowing at C4-5 and C5-6.  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 Faint dependent atelectasis at the left lung apex with ground-glass opacities. 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 IMPRESSION: No acute intracranial abnormality. No acute cervical spinal abnormality. Mild mid cervical spondylosis at C4-5 and C5-6. 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 Electronically Signed   By: Ashley Royalty M.D.   On: 02/17/2016 21:54   Ct Chest W Contrast  Result Date: 02/17/2016 CLINICAL DATA:  Fall today from 8 foot ladder. Lumbosacral back pain, right hip pain. EXAM: CT CHEST, ABDOMEN, AND PELVIS WITH CONTRAST TECHNIQUE: Multidetector CT imaging of the chest, abdomen and pelvis was performed following the standard protocol during bolus administration of intravenous contrast. CONTRAST:  100 cc Isovue-300 IV COMPARISON:  Report from chest CT 07/13/2002, images not available. FINDINGS: CT CHEST FINDINGS Cardiovascular: No acute traumatic aortic injury. Atherosclerosis of the thoracic aorta. Coronary artery calcifications. No mediastinal hematoma. No pericardial fluid. Mediastinum/Nodes: Marked enlargement and heterogeneity of the left lobe of the thyroid gland with central areas of low density and calcification. Thyroid gland measures at least  6.3 x 6.0 x 5.9 cm. There is mass effect on the trachea and esophagus with rightward displacement. Small lymph nodes just inferior to the thyroid enlargement. No hilar adenopathy. Lungs/Pleura: No pneumothorax. Thick walled bleb in the right upper lobe likely postinflammatory, patient with history of extensive right upper lobe pneumonia. No pleural fluid. There is central bronchial thickening which may be chronic. Musculoskeletal: No fracture of the sternum, ribs, or included shoulder girdles. Thoracic spine is intact without acute fracture. Mild thoracic spine degenerative change. CT ABDOMEN PELVIS FINDINGS Hepatobiliary: No  hepatic injury or perihepatic hematoma. Gallbladder is unremarkable there is hepatic steatosis. Small subcentimeter hypodense lesions scattered throughout both lobes of the liver are too small to accurately characterize. Pancreas: Unremarkable. No pancreatic ductal dilatation or surrounding inflammatory changes. Spleen: No splenic injury or perisplenic hematoma. Adrenals/Urinary Tract: No adrenal hemorrhage. Hypodense 2.3 x 21.5 cm left adrenal lesion was described on prior exam and likely benign adenoma. No perinephric stranding or renal injury. Simple cyst in the mid left kidney. The ureters are decompressed. Urinary bladder is physiologically distended, no bladder wall thickening. Stomach/Bowel: Stomach is physiologically distended with ingested contents. No bowel wall thickening or inflammation. No mesenteric hematoma. Scattered colonic diverticula without diverticulitis. Normal appendix. Vascular/Lymphatic: No acute vascular finding air injury. Atherosclerosis of the abdominal aorta and its branches. No aneurysm. No retroperitoneal fluid. No evidence of adenopathy. Reproductive: Prostate is unremarkable. Other: No free air or free fluid. Mild edema in the midline subcutaneous tissues at the lumbar spine and thoracolumbar junction. Musculoskeletal: No acute fracture of the bony pelvis, either hip, or lumbar spine. Vertebral body heights are maintained. IMPRESSION: 1. Subcutaneous edema in the soft tissues posterior to the lumbar spine, no evidence of spine fracture. 2. There is otherwise no evidence of acute traumatic injury in the chest, abdomen, or pelvis. 3. Thick-walled bleb in the right upper lobe is likely sequela of prior infection. 4. Left lobe thyroid enlargement with substernal extension and mass effect on the trachea and esophagus. Recommend nonemergent thyroid ultrasound. 5. Benign left adrenal adenoma. 6. Atherosclerosis including coronary artery calcifications. Electronically Signed   By: Jeb Levering M.D.   On: 02/17/2016 22:02   Ct Cervical Spine Wo Contrast  Result Date: 02/17/2016 CLINICAL DATA:  Pain after falling from an 8 foot-ladder. EXAM: CT HEAD WITHOUT CONTRAST CT CERVICAL SPINE WITHOUT CONTRAST TECHNIQUE: Multidetector CT imaging of the head and cervical spine was performed following the standard protocol without intravenous contrast. Multiplanar CT image reconstructions of the cervical spine were also generated. COMPARISON:  None. FINDINGS: CT HEAD FINDINGS BRAIN: No acute intracranial hemorrhage, midline shift or edema. Chronic small vessel ischemic change periventricular white matter. No intra-axial mass nor extra-axial collections. The ventricles and sulci are age appropriate. Basal cisterns and fourth ventricle are midline. VASCULAR: No acute large vascular territory infarction. SKULL/SOFT TISSUES: No skull fracture. No significant soft tissue swelling. ORBITS/SINUSES: The included ocular globes and orbital contents are normal.The mastoid air-cells and included paranasal sinuses are well-aerated. OTHER: None. CT CERVICAL SPINE FINDINGS ALIGNMENT: There is reversal cervical lordosis possibly muscle spasm or positioning of the patient. The craniocervical relationship is intact. The atlantodental interval is maintained. SKULL BASE AND VERTEBRAE: Cervical vertebral bodies and posterior elements are intact. No destructive bony lesions. C1-2 articulation maintained. SOFT TISSUES AND SPINAL CANAL: No intraspinal hemorrhage. Prevertebral soft tissue swelling. DISC LEVELS: Mild osseous canal stenosis at C4-5 and C5-6. There is slight disc space narrowing from C4 through C7 with small posterior marginal osteophytes contributing  to bilateral neural foraminal narrowing at C4-5 and C5-6. 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 Faint dependent atelectasis at the left lung apex with ground-glass opacities. 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 IMPRESSION: No acute intracranial abnormality. No acute cervical spinal abnormality. Mild mid cervical spondylosis at C4-5 and C5-6. 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 Electronically Signed   By: Ashley Royalty M.D.   On: 02/17/2016 21:54   Ct Abdomen Pelvis W Contrast  Result Date: 02/17/2016 CLINICAL DATA:  Fall today from 8 foot ladder. Lumbosacral back pain, right hip pain. EXAM: CT CHEST, ABDOMEN, AND PELVIS WITH CONTRAST TECHNIQUE: Multidetector CT imaging of the chest, abdomen and pelvis was performed following the standard protocol during bolus administration of intravenous contrast. CONTRAST:  100 cc Isovue-300 IV COMPARISON:  Report from chest CT 07/13/2002, images not available. FINDINGS: CT CHEST FINDINGS Cardiovascular: No acute traumatic aortic injury. Atherosclerosis of the thoracic aorta. Coronary artery calcifications. No mediastinal hematoma. No pericardial fluid. Mediastinum/Nodes: Marked enlargement and heterogeneity of the left lobe of the thyroid gland with central areas of low density and calcification. Thyroid gland measures at least 6.3 x 6.0 x 5.9 cm. There is mass effect on the trachea and esophagus with rightward displacement. Small lymph nodes just inferior to the thyroid enlargement. No hilar adenopathy. Lungs/Pleura: No pneumothorax. Thick walled bleb in the right upper lobe likely postinflammatory, patient with history of extensive right upper lobe pneumonia. No pleural fluid. There is central bronchial thickening which may be chronic. Musculoskeletal: No fracture of the sternum, ribs, or included shoulder girdles. Thoracic spine is intact without acute fracture. Mild thoracic spine degenerative change. CT ABDOMEN PELVIS FINDINGS Hepatobiliary: No hepatic injury or perihepatic hematoma. Gallbladder is unremarkable there is hepatic steatosis. Small subcentimeter hypodense lesions scattered throughout both lobes of the liver are too small to  accurately characterize. Pancreas: Unremarkable. No pancreatic ductal dilatation or surrounding inflammatory changes. Spleen: No splenic injury or perisplenic hematoma. Adrenals/Urinary Tract: No adrenal hemorrhage. Hypodense 2.3 x 21.5 cm left adrenal lesion was described on prior exam and likely benign adenoma. No perinephric stranding or renal injury. Simple cyst in the mid left kidney. The ureters are decompressed. Urinary bladder is physiologically distended, no bladder wall thickening. Stomach/Bowel: Stomach is physiologically distended with ingested contents. No bowel wall thickening or inflammation. No mesenteric hematoma. Scattered colonic diverticula without diverticulitis. Normal appendix. Vascular/Lymphatic: No acute vascular finding air injury. Atherosclerosis of the abdominal aorta and its branches. No aneurysm. No retroperitoneal fluid. No evidence of adenopathy. Reproductive: Prostate is unremarkable. Other: No free air or free fluid. Mild edema in the midline subcutaneous tissues at the lumbar spine and thoracolumbar junction. Musculoskeletal: No acute fracture of the bony pelvis, either hip, or lumbar spine. Vertebral body heights are maintained. IMPRESSION: 1. Subcutaneous edema in the soft tissues posterior to the lumbar spine, no evidence of spine fracture. 2. There is otherwise no evidence of acute traumatic injury in the chest, abdomen, or pelvis. 3. Thick-walled bleb in the right upper lobe is likely sequela of prior infection. 4. Left lobe thyroid enlargement with substernal extension and mass effect on the trachea and esophagus. Recommend nonemergent thyroid ultrasound. 5. Benign left adrenal adenoma. 6. Atherosclerosis including coronary artery calcifications. Electronically Signed   By: Jeb Levering M.D.   On: 02/17/2016 22:02   Dg Shoulder Left  Result Date: 02/17/2016 CLINICAL DATA:  65 year old male status post fall from 8 foot ladder  today. Pain. Initial encounter. EXAM: LEFT  SHOULDER - 2+ VIEW COMPARISON:  None. FINDINGS: No glenohumeral joint dislocation. IMPRESSION: Negative. Electronically Signed   By: Genevie Ann M.D.   On: 02/17/2016 17:05    Procedures Procedures (including critical care time)  Medications Ordered in ED Medications  HYDROmorphone (DILAUDID) injection 1 mg (1 mg Intravenous Given 02/17/16 1833)  ondansetron (ZOFRAN) injection 4 mg (4 mg Intramuscular Given 02/17/16 1827)  iopamidol (ISOVUE-300) 61 % injection (100 mLs  Contrast Given 02/17/16 2038)  HYDROmorphone (DILAUDID) injection 1 mg (1 mg Intravenous Given 02/17/16 2113)     Initial Impression / Assessment and Plan / ED Course  I have reviewed the triage vital signs and the nursing notes.  Pertinent labs & imaging results that were available during my care of the patient were reviewed by me and considered in my medical decision making (see chart for details).  Clinical Course     Dr Gustavus Messing in to see and examine.  Pt counseled on thyroid enlargement.  Pt reports he has had a history of thyroid problems.  He is followed by the [facility] and understands he needs further evaluation.  Pt given medication for pain.  He is advised of need to return if pain worsen or changes.  I suspect pt will have a   Final Clinical Impressions(s) / ED Diagnoses   Final diagnoses:  Injury of head, initial encounter  Fall, initial encounter  Contusion of lower back, initial encounter  Thyroid enlargement    New Prescriptions Discharge Medication List as of 02/17/2016 11:04 PM    START taking these medications   Details  ibuprofen (ADVIL,MOTRIN) 800 MG tablet Take 1 tablet (800 mg total) by mouth 3 (three) times daily., Starting Tue 02/17/2016, Print    methocarbamol (ROBAXIN) 500 MG tablet Take 1 tablet (500 mg total) by mouth 4 (four) times daily., Starting Tue 02/17/2016, Print    oxyCODONE-acetaminophen (PERCOCET/ROXICET) 5-325 MG tablet Take 2 tablets by mouth every 4 (four) hours as needed for severe  pain., Starting Tue 02/17/2016, Print         Hollace Kinnier [place], PA-C 02/18/16 0127    Gwenyth Allegra Tegeler, MD 02/18/16 [phone]

## 2019-01-02 ENCOUNTER — Emergency Department (HOSPITAL_COMMUNITY): Payer: No Typology Code available for payment source

## 2019-01-02 ENCOUNTER — Encounter (HOSPITAL_COMMUNITY): Payer: Self-pay | Admitting: *Deleted

## 2019-01-02 ENCOUNTER — Other Ambulatory Visit: Payer: Self-pay

## 2019-01-02 ENCOUNTER — Emergency Department (HOSPITAL_COMMUNITY)
Admission: EM | Admit: 2019-01-02 | Discharge: 2019-01-02 | Disposition: A | Discharge status: Home / Self Care | Payer: No Typology Code available for payment source | Attending: Emergency Medicine | Admitting: Emergency Medicine

## 2019-01-02 DIAGNOSIS — F17210 Nicotine dependence, cigarettes, uncomplicated: Secondary | ICD-10-CM | POA: Diagnosis not present

## 2019-01-02 DIAGNOSIS — E119 Type 2 diabetes mellitus without complications: Secondary | ICD-10-CM | POA: Diagnosis not present

## 2019-01-02 DIAGNOSIS — R109 Unspecified abdominal pain: Secondary | ICD-10-CM

## 2019-01-02 HISTORY — DX: Gastro-esophageal reflux disease without esophagitis: K21.9

## 2019-01-02 HISTORY — DX: Disorder of thyroid, unspecified: E07.9

## 2019-01-02 HISTORY — DX: Hyperlipidemia, unspecified: E78.5

## 2019-01-02 HISTORY — DX: Type 2 diabetes mellitus without complications: E11.9

## 2019-01-02 LAB — COMPREHENSIVE METABOLIC PANEL
ALT: 20 U/L (ref 0–44)
AST: 28 U/L (ref 15–41)
Albumin: 3.7 g/dL (ref 3.5–5.0)
Alkaline Phosphatase: 47 U/L (ref 38–126)
Anion gap: 11 (ref 5–15)
BUN: 14 mg/dL (ref 8–23)
CO2: 17 mmol/L — ABNORMAL LOW (ref 22–32)
Calcium: 8.4 mg/dL — ABNORMAL LOW (ref 8.9–10.3)
Chloride: 102 mmol/L (ref 98–111)
Creatinine, Ser: 1.27 mg/dL — ABNORMAL HIGH (ref 0.61–1.24)
GFR calc Af Amer: >60 mL/min (ref >60)
GFR calc non Af Amer: 57 mL/min — ABNORMAL LOW (ref >60)
Glucose, Bld: 109 mg/dL — ABNORMAL HIGH (ref 70–99)
Potassium: 3.9 mmol/L (ref 3.5–5.1)
Sodium: 130 mmol/L — ABNORMAL LOW (ref 135–145)
Total Bilirubin: 1 mg/dL (ref 0.3–1.2)
Total Protein: 8 g/dL (ref 6.5–8.1)

## 2019-01-02 LAB — CBC
HCT: 46.9 % (ref 39.0–52.0)
Hemoglobin: 14.7 g/dL (ref 13.0–17.0)
MCH: 29.8 pg (ref 26.0–34.0)
MCHC: 31.3 g/dL (ref 30.0–36.0)
MCV: 94.9 fL (ref 80.0–100.0)
Platelets: 141 10*3/uL — ABNORMAL LOW (ref 150–400)
RBC: 4.94 MIL/uL (ref 4.22–5.81)
RDW: 14.8 % (ref 11.5–15.5)
WBC: 5.3 10*3/uL (ref 4.0–10.5)
nRBC: 0 % (ref 0.0–0.2)

## 2019-01-02 LAB — URINALYSIS, ROUTINE W REFLEX MICROSCOPIC
Bilirubin Urine: NEGATIVE
Glucose, UA: NEGATIVE mg/dL
Hgb urine dipstick: NEGATIVE
Ketones, ur: 5 mg/dL — AB
Leukocytes,Ua: NEGATIVE
Nitrite: NEGATIVE
Protein, ur: 30 mg/dL — AB
Specific Gravity, Urine: 1.025 (ref 1.005–1.030)
pH: 5 (ref 5.0–8.0)

## 2019-01-02 LAB — TROPONIN I (HIGH SENSITIVITY)
Troponin I (High Sensitivity): 6 ng/L (ref <18)
Troponin I (High Sensitivity): 7 ng/L (ref <18)

## 2019-01-02 LAB — LIPASE, BLOOD: Lipase: 19 U/L (ref 11–51)

## 2019-01-02 MED ORDER — HYDROXYZINE HCL 25 MG PO TABS: 25.0000 mg | ORAL_TABLET | Freq: Every evening | ORAL | 0 refills | Status: AC | PRN

## 2019-01-02 MED ORDER — SODIUM CHLORIDE (PF) 0.9 % IJ SOLN
INTRAMUSCULAR | Status: AC
  Filled 2019-01-02: qty 50

## 2019-01-02 MED ORDER — IOHEXOL 300 MG/ML  SOLN
100.0000 mL | Freq: Once | INTRAMUSCULAR | Status: AC | PRN
  Administered 2019-01-02: 100 mL via INTRAVENOUS

## 2019-01-02 MED ORDER — SODIUM CHLORIDE 0.9% FLUSH: 3.0000 mL | Freq: Once | INTRAVENOUS | Status: DC

## 2019-01-02 MED ORDER — ACETAMINOPHEN 500 MG PO TABS: 500.0000 mg | ORAL_TABLET | Freq: Four times a day (QID) | ORAL | 0 refills | Status: DC | PRN

## 2019-01-02 NOTE — Discharge Instructions (Signed)
Advised patient to discontinue the Mobic and instead will prescribe him 500 mg Tylenol to take as needed for his pain discomfort.  Also suggested that he continue taking his Protonix.  We will add Vistaril to help him sleep at night.  Please be careful ambulating while on Vistaril as it can make you drowsy.   Requested that he follow-up with his PCP to consider outpatient MRI for the liver lesions found on CT.  Patient states that his PCP should need to put him in touch with a cardiologist at the [facility].  Given his report of anginal-like symptoms, highly encouraged him to follow-up with cardiology.    Return to the ED or seek medical attention should you develop any new or worsening chest pain, shortness of breath, abdominal discomfort, fevers, chills, dark black stools, lightheadedness, or any other new or symptoms.

## 2019-01-02 NOTE — ED Provider Notes (Signed)
[facility] DEPT Provider Note   CSN: [id number] Arrival date & time: 01/02/19  1026     History   Chief Complaint Chief Complaint  Patient presents with   Abdominal Pain   Back Pain    HPI Larry Richmond is a 68 y.o. male with past medical history significant for type II DM, HLD, and GERD who presents to the ED with a 60-monthhistory of progressively worsening epigastric discomfort rating to his back.  He states the pain is worse when lying down and following meals.  [facility] suspects that it could be related to his gallbladder and sent to the ED for further evaluation.  He also reports that he has had dyspnea on exertion with associated anginal-like chest pain that is relieved by rest.  History significant for tobacco use.  Patient states that has never been evaluated by cardiologist.  He denies any dizziness, headache, fever, chills, nausea, vomiting, changes in bowel habits, or urinary symptoms.     HPI  Past Medical History:  Diagnosis Date   Diabetes mellitus without complication ([facility]    GERD (gastroesophageal reflux disease)    Hyperlipidemia    Thyroid disease     Patient Active Problem List   Diagnosis Date Noted   CAP (community acquired pneumonia) 08/10/2015   Elevated blood pressure 08/10/2015   Lower leg edema 08/10/2015   Tobacco use disorder 08/10/2015    Past Surgical History:  Procedure Laterality Date   THYROID SURGERY          Home Medications    Prior to Admission medications   Medication Sig Start Date End Date Taking? Authorizing Provider  acetaminophen (TYLENOL) 500 MG tablet Take 1 tablet (500 mg total) by mouth every 6 (six) hours as needed. 01/02/19   GCorena Herter PA-C  hydrOXYzine (ATARAX/VISTARIL) 25 MG tablet Take 1 tablet (25 mg total) by mouth at bedtime as needed for up to 21 days (Sleep). 01/02/19 01/23/19  GCorena Herter PA-C  ibuprofen (ADVIL,MOTRIN) 800 MG tablet Take 1  tablet (800 mg total) by mouth 3 (three) times daily. 02/17/16   SFransico Meadow PA-C  methocarbamol (ROBAXIN) 500 MG tablet Take 1 tablet (500 mg total) by mouth 4 (four) times daily. 02/17/16   SFransico Meadow PA-C  oxyCODONE-acetaminophen (PERCOCET/ROXICET) 5-325 MG tablet Take 2 tablets by mouth every 4 (four) hours as needed for severe pain. 02/17/16   SFransico Meadow PA-C  pantoprazole (PROTONIX) 40 MG tablet Take 40 mg by mouth every evening.    [provider]    Family History No family history on file.  Social History Social History   Tobacco Use   Smoking status: Current Every Day Smoker    Types: Cigarettes   Smokeless tobacco: Never Used  Substance Use Topics   Alcohol use: Yes    Comment: occasiona;   Drug use: Not Currently    Types: Cocaine     Allergies   Patient has no known allergies.   Review of Systems Review of Systems  All other systems reviewed and are negative.    Physical Exam Updated Vital Signs BP (!) 159/77    Pulse 79    Temp 99.2 F (37.3 C) (Oral)    Resp 20    Ht _0  (1.88 m)    Wt (!) 151.5 kg    SpO2 99%    BMI 42.88 kg/m   Physical Exam Vitals signs and nursing note reviewed. Exam  conducted with a chaperone present.  Constitutional:      Appearance: Normal appearance.  HENT:     Head: Normocephalic and atraumatic.  Eyes:     General: No scleral icterus.    Conjunctiva/sclera: Conjunctivae normal.     Comments: Xanthelasma  Neck:     Musculoskeletal: Normal range of motion.  Cardiovascular:     Rate and Rhythm: Normal rate and regular rhythm.  Pulmonary:     Effort: Pulmonary effort is normal.  Skin:    General: Skin is dry.  Neurological:     Mental Status: He is alert.     GCS: GCS eye subscore is 4. GCS verbal subscore is 5. GCS motor subscore is 6.  Psychiatric:        Mood and Affect: Mood normal.        Behavior: Behavior normal.        Thought Content: Thought content normal.      ED  Treatments / Results  Labs (all labs ordered are listed, but only abnormal results are displayed) Labs Reviewed  COMPREHENSIVE METABOLIC PANEL - Abnormal; Notable for the following components:      Result Value   Sodium 130 (*)    CO2 17 (*)    Glucose, Bld 109 (*)    Creatinine, Ser 1.27 (*)    Calcium 8.4 (*)    GFR calc non Af Amer 57 (*)    All other components within normal limits  CBC - Abnormal; Notable for the following components:   Platelets 141 (*)    All other components within normal limits  URINALYSIS, ROUTINE W REFLEX MICROSCOPIC - Abnormal; Notable for the following components:   Color, Urine AMBER (*)    APPearance HAZY (*)    Ketones, ur 5 (*)    Protein, ur 30 (*)    Bacteria, UA RARE (*)    All other components within normal limits  LIPASE, BLOOD  TROPONIN I (HIGH SENSITIVITY)  TROPONIN I (HIGH SENSITIVITY)    EKG None  Radiology Ct Abdomen Pelvis W Contrast  Result Date: 01/02/2019 CLINICAL DATA:  Acute abdominal pain EXAM: CT ABDOMEN AND PELVIS WITH CONTRAST TECHNIQUE: Multidetector CT imaging of the abdomen and pelvis was performed using the standard protocol following bolus administration of intravenous contrast. CONTRAST:  151m OMNIPAQUE IOHEXOL 300 MG/ML  SOLN COMPARISON:  CT scan 02/18/2016 FINDINGS: Lower chest: The heart is normal in size. No pericardial effusion. Coronary artery calcifications are noted. There are patchy basilar scarring changes but no acute pulmonary findings or worrisome pulmonary lesions. Hepatobiliary: A few small low-attenuation liver lesions appears stable and likely benign cyst. There is also a vague lesion in segment 7 on image number 17 measuring approximately 2 cm. It is difficult to see on the prior CT scan. It could be a hemangioma. There is also mild diffuse fatty infiltration of the liver and this could be an area of more focal fat. No intra or extrahepatic biliary dilatation. The gallbladder appears normal. Pancreas: No  mass, inflammation or ductal dilatation. Scattered parenchymal calcifications may suggest prior pancreatitis. Spleen: Normal size.  No focal lesions. Adrenals/Urinary Tract: Stable bilateral adrenal gland nodules most consistent with benign adenomas. Simple appearing left renal cyst. No worrisome renal lesions or hydroureteronephrosis. The bladder is unremarkable. Stomach/Bowel: The stomach, duodenum, small bowel and colon are unremarkable. No acute inflammatory changes, mass lesions or obstructive findings. The terminal ileum and appendix are normal. No significant diverticulosis or findings for acute diverticulitis. Vascular/Lymphatic: Age advanced  atherosclerotic calcifications involving the aorta and iliac arteries. No aneurysm or focal dissection. The branch vessels are patent. The major venous structures are patent. No mesenteric or retroperitoneal mass or adenopathy. Reproductive: The prostate gland and seminal vesicles are unremarkable. Other: No pelvic mass or adenopathy. No free pelvic fluid collections. No inguinal mass or adenopathy. No abdominal wall hernia or subcutaneous lesions. Musculoskeletal: No significant bony findings. Stable benign lipoma between the gluteus minimus and medius muscles on the left. IMPRESSION: 1. No acute abdominal/pelvic findings. 2. Small scattered low-attenuation liver lesions, likely benign cysts. There is an indeterminate 2 cm lesion in the right hepatic lobe. Recommend MRI abdomen without and with contrast for further evaluation. 3. Stable benign-appearing bilateral adrenal gland adenomas. 4. Mild diffuse fatty infiltration of the liver. 5. Advanced vascular calcifications but no aneurysm or dissection or occlusion. Electronically Signed   By: Marijo Sanes M.D.   On: 01/02/2019 15:47   Dg Chest Portable 1 View  Result Date: 01/02/2019 CLINICAL DATA:  Epigastric abdominal pain and left chest pain. EXAM: PORTABLE CHEST 1 VIEW COMPARISON:  Chest x-ray 08/10/2015  FINDINGS: The heart is borderline enlarged. There is mild tortuosity and calcification of the thoracic aorta. Chronic appearing lung changes with bullous emphysema and areas of pulmonary scarring. There are bibasilar scarring changes without definite superimposed infiltrates or effusions. 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 No definite acute overlying pulmonary process. Electronically Signed   By: Marijo Sanes M.D.   On: 01/02/2019 13:22    Procedures Procedures (including critical care time)  Medications Ordered in ED Medications  sodium chloride (PF) 0.9 % injection (has no administration in time range)  iohexol (OMNIPAQUE) 300 MG/ML solution 100 mL (100 mLs Intravenous Contrast Given 01/02/19 1452)     Initial Impression / Assessment and Plan / ED Course  I have reviewed the triage vital signs and the nursing notes.  Pertinent labs & imaging results that were available during my care of the patient were reviewed by me and considered in my medical decision making (see chart for details).        Patient takes Mobic regularly for right shoulder discomfort and food suspect that if he had pericarditis it would have improved with regular NSAIDs.  Additionally, there was no PR depression or diffuse ST elevation on EKG to suggest a pericardial inflammation.  Also do not suspect myocarditis for those reasons and for lack of fever.  Patient's initial troponin was within normal limits and do not suspect ACS.  However, he has reported dyspnea on exertion and associated anginal-like symptoms so I will be referring him to a cardiologist for further evaluation management.  While patient endorsed GERD, Mobic use, tobacco use, do not suspect active PUD as this is been going on for 2 months without melena or associated microcytic anemia to suggest GI bleed.  Patient was noticed tenderness epigastrium, but his lipase was normal and there is nothing  on CT suggest pancreatitis.  His CMP was also unremarkable and without transaminitis or elevated alk phos that would suggest gallbladder etiology.  CT abdomen pelvis was negative for gallstones, diverticulitis, descending aortic dissection, or any other acute pathology that would explain his nonspecific abdominal discomfort.  Advised patient to discontinue the Mobic and instead will prescribe him 500 mg Tylenol to take as needed for his pain discomfort.  Also suggested that he continue taking his Protonix.  We will add Vistaril to help him sleep at night.  Informed the patient  that he needs to follow-up with his PCP to consider outpatient MRI for the liver lesions found on CT.  Patient states that his PCP should need to put him in touch with a cardiologist at the [facility].  Given his report of anginal-like symptoms, highly encouraged him to follow-up with cardiology.  Return to the ED or seek medical attention should you develop any new or worsening chest pain, shortness of breath, abdominal discomfort, fevers, chills, dark black stools, lightheadedness, or any other new or symptoms.  All of the evaluation and work-up results were discussed with the patient at bedside. They were provided opportunity to ask any additional questions and have none at this time. They have expressed understanding of verbal discharge instructions as well as return precautions and are agreeable to the plan.     Final Clinical Impressions(s) / ED Diagnoses   Final diagnoses:  Abdominal pain, unspecified abdominal location    ED Discharge Orders         Ordered    hydrOXYzine (ATARAX/VISTARIL) 25 MG tablet  At bedtime PRN     01/02/19 1641    acetaminophen (TYLENOL) 500 MG tablet  Every 6 hours PRN     01/02/19 1641           Corena Herter, PA-C 01/02/19 1643    Charlesetta Shanks, MD 01/03/19 [phone]

## 2019-01-02 NOTE — ED Notes (Signed)
Patient transported to CT 

## 2019-01-02 NOTE — ED Triage Notes (Signed)
Pt states he is having epigastric pain radiating to back and under left chest area. He goes to the [facility] and was told it may be Gall Bladder. He could not get an appointment with them in the near future ans was told to come here.

## 2019-01-05 ENCOUNTER — Inpatient Hospital Stay (HOSPITAL_COMMUNITY)
Admission: EM | Admit: 2019-01-05 | Discharge: 2019-01-11 | DRG: 177 | Disposition: A | Discharge status: Home / Self Care | Payer: Medicare Other | Attending: Internal Medicine | Admitting: Internal Medicine

## 2019-01-05 ENCOUNTER — Emergency Department (HOSPITAL_COMMUNITY): Payer: Medicare Other

## 2019-01-05 ENCOUNTER — Other Ambulatory Visit: Payer: Self-pay

## 2019-01-05 ENCOUNTER — Encounter (HOSPITAL_COMMUNITY): Payer: Self-pay | Admitting: Emergency Medicine

## 2019-01-05 DIAGNOSIS — J440 Chronic obstructive pulmonary disease with acute lower respiratory infection: Secondary | ICD-10-CM | POA: Diagnosis present

## 2019-01-05 DIAGNOSIS — Z87891 Personal history of nicotine dependence: Secondary | ICD-10-CM

## 2019-01-05 DIAGNOSIS — U071 COVID-19: Secondary | ICD-10-CM | POA: Diagnosis present

## 2019-01-05 DIAGNOSIS — R0902 Hypoxemia: Secondary | ICD-10-CM

## 2019-01-05 DIAGNOSIS — Z6841 Body Mass Index (BMI) 40.0 and over, adult: Secondary | ICD-10-CM

## 2019-01-05 DIAGNOSIS — E119 Type 2 diabetes mellitus without complications: Secondary | ICD-10-CM | POA: Diagnosis present

## 2019-01-05 DIAGNOSIS — Z23 Encounter for immunization: Secondary | ICD-10-CM | POA: Diagnosis not present

## 2019-01-05 DIAGNOSIS — Z79899 Other long term (current) drug therapy: Secondary | ICD-10-CM | POA: Diagnosis not present

## 2019-01-05 DIAGNOSIS — E890 Postprocedural hypothyroidism: Secondary | ICD-10-CM | POA: Diagnosis present

## 2019-01-05 DIAGNOSIS — K760 Fatty (change of) liver, not elsewhere classified: Secondary | ICD-10-CM | POA: Diagnosis present

## 2019-01-05 DIAGNOSIS — J9621 Acute and chronic respiratory failure with hypoxia: Secondary | ICD-10-CM | POA: Diagnosis present

## 2019-01-05 DIAGNOSIS — K219 Gastro-esophageal reflux disease without esophagitis: Secondary | ICD-10-CM | POA: Diagnosis present

## 2019-01-05 DIAGNOSIS — J1289 Other viral pneumonia: Secondary | ICD-10-CM | POA: Diagnosis present

## 2019-01-05 DIAGNOSIS — E871 Hypo-osmolality and hyponatremia: Secondary | ICD-10-CM | POA: Insufficient documentation

## 2019-01-05 DIAGNOSIS — E079 Disorder of thyroid, unspecified: Secondary | ICD-10-CM | POA: Diagnosis not present

## 2019-01-05 DIAGNOSIS — E039 Hypothyroidism, unspecified: Secondary | ICD-10-CM | POA: Diagnosis present

## 2019-01-05 DIAGNOSIS — N179 Acute kidney failure, unspecified: Secondary | ICD-10-CM | POA: Diagnosis present

## 2019-01-05 DIAGNOSIS — J1282 Pneumonia due to coronavirus disease 2019: Secondary | ICD-10-CM | POA: Diagnosis present

## 2019-01-05 DIAGNOSIS — E785 Hyperlipidemia, unspecified: Secondary | ICD-10-CM | POA: Diagnosis present

## 2019-01-05 DIAGNOSIS — Z79890 Hormone replacement therapy (postmenopausal): Secondary | ICD-10-CM

## 2019-01-05 LAB — CBC
HCT: 42.8 % (ref 39.0–52.0)
Hemoglobin: 13.9 g/dL (ref 13.0–17.0)
MCH: 30.1 pg (ref 26.0–34.0)
MCHC: 32.5 g/dL (ref 30.0–36.0)
MCV: 92.6 fL (ref 80.0–100.0)
Platelets: 149 10*3/uL — ABNORMAL LOW (ref 150–400)
RBC: 4.62 MIL/uL (ref 4.22–5.81)
RDW: 14.8 % (ref 11.5–15.5)
WBC: 7.3 10*3/uL (ref 4.0–10.5)
nRBC: 0 % (ref 0.0–0.2)

## 2019-01-05 LAB — CBC WITH DIFFERENTIAL/PLATELET
Abs Immature Granulocytes: 0.06 10*3/uL (ref 0.00–0.07)
Basophils Absolute: 0 10*3/uL (ref 0.0–0.1)
Basophils Relative: 0 %
Eosinophils Absolute: 0 10*3/uL (ref 0.0–0.5)
Eosinophils Relative: 0 %
HCT: 42.9 % (ref 39.0–52.0)
Hemoglobin: 14.5 g/dL (ref 13.0–17.0)
Immature Granulocytes: 1 %
Lymphocytes Relative: 7 %
Lymphs Abs: 0.6 10*3/uL — ABNORMAL LOW (ref 0.7–4.0)
MCH: 30.6 pg (ref 26.0–34.0)
MCHC: 33.8 g/dL (ref 30.0–36.0)
MCV: 90.5 fL (ref 80.0–100.0)
Monocytes Absolute: 0.2 10*3/uL (ref 0.1–1.0)
Monocytes Relative: 2 %
Neutro Abs: 7.8 10*3/uL — ABNORMAL HIGH (ref 1.7–7.7)
Neutrophils Relative %: 90 %
Platelets: 145 10*3/uL — ABNORMAL LOW (ref 150–400)
RBC: 4.74 MIL/uL (ref 4.22–5.81)
RDW: 14.5 % (ref 11.5–15.5)
WBC: 8.6 10*3/uL (ref 4.0–10.5)
nRBC: 0 % (ref 0.0–0.2)

## 2019-01-05 LAB — COMPREHENSIVE METABOLIC PANEL
ALT: 75 U/L — ABNORMAL HIGH (ref 0–44)
AST: 126 U/L — ABNORMAL HIGH (ref 15–41)
Albumin: 3 g/dL — ABNORMAL LOW (ref 3.5–5.0)
Alkaline Phosphatase: 77 U/L (ref 38–126)
Anion gap: 11 (ref 5–15)
BUN: 16 mg/dL (ref 8–23)
CO2: 20 mmol/L — ABNORMAL LOW (ref 22–32)
Calcium: 8.2 mg/dL — ABNORMAL LOW (ref 8.9–10.3)
Chloride: 97 mmol/L — ABNORMAL LOW (ref 98–111)
Creatinine, Ser: 1.41 mg/dL — ABNORMAL HIGH (ref 0.61–1.24)
GFR calc Af Amer: 58 mL/min — ABNORMAL LOW (ref >60)
GFR calc non Af Amer: 50 mL/min — ABNORMAL LOW (ref >60)
Glucose, Bld: 125 mg/dL — ABNORMAL HIGH (ref 70–99)
Potassium: 4 mmol/L (ref 3.5–5.1)
Sodium: 128 mmol/L — ABNORMAL LOW (ref 135–145)
Total Bilirubin: 1 mg/dL (ref 0.3–1.2)
Total Protein: 6.9 g/dL (ref 6.5–8.1)

## 2019-01-05 LAB — LACTIC ACID, PLASMA
Lactic Acid, Venous: 2.5 mmol/L (ref 0.5–1.9)
Lactic Acid, Venous: 1.4 mmol/L (ref 0.5–1.9)

## 2019-01-05 LAB — GLUCOSE, CAPILLARY: Glucose-Capillary: 119 mg/dL — ABNORMAL HIGH (ref 70–99)

## 2019-01-05 LAB — OSMOLALITY: Osmolality: 270 mOsm/kg — ABNORMAL LOW (ref 275–295)

## 2019-01-05 LAB — D-DIMER, QUANTITATIVE: D-Dimer, Quant: 1.12 ug/mL-FEU — ABNORMAL HIGH (ref 0.00–0.50)

## 2019-01-05 LAB — HEMOGLOBIN A1C
Hgb A1c MFr Bld: 6 % — ABNORMAL HIGH (ref 4.8–5.6)
Mean Plasma Glucose: 125.5 mg/dL

## 2019-01-05 LAB — SARS CORONAVIRUS 2 BY RT PCR (HOSPITAL ORDER, PERFORMED IN ~~LOC~~ HOSPITAL LAB): SARS Coronavirus 2: POSITIVE — AB

## 2019-01-05 LAB — CREATININE, SERUM
Creatinine, Ser: 1.29 mg/dL — ABNORMAL HIGH (ref 0.61–1.24)
GFR calc Af Amer: >60 mL/min (ref >60)
GFR calc non Af Amer: 56 mL/min — ABNORMAL LOW (ref >60)

## 2019-01-05 LAB — HIV ANTIBODY (ROUTINE TESTING W REFLEX): HIV Screen 4th Generation wRfx: NONREACTIVE

## 2019-01-05 LAB — HEPATITIS B SURFACE ANTIGEN: Hepatitis B Surface Ag: NONREACTIVE

## 2019-01-05 LAB — TRIGLYCERIDES: Triglycerides: 105 mg/dL (ref <150)

## 2019-01-05 LAB — ABO/RH: ABO/RH(D): A POS

## 2019-01-05 LAB — C-REACTIVE PROTEIN: CRP: 25.2 mg/dL — ABNORMAL HIGH (ref <1.0)

## 2019-01-05 LAB — FIBRINOGEN: Fibrinogen: >800 mg/dL — ABNORMAL HIGH (ref 210–475)

## 2019-01-05 LAB — FERRITIN: Ferritin: 2318 ng/mL — ABNORMAL HIGH (ref 24–336)

## 2019-01-05 LAB — PROCALCITONIN: Procalcitonin: 1.94 ng/mL

## 2019-01-05 LAB — LACTATE DEHYDROGENASE: LDH: 370 U/L — ABNORMAL HIGH (ref 98–192)

## 2019-01-05 LAB — HEPATITIS B CORE ANTIBODY, TOTAL: Hep B Core Total Ab: NONREACTIVE

## 2019-01-05 LAB — HEPATITIS C ANTIBODY: HCV Ab: NONREACTIVE

## 2019-01-05 LAB — HEPATITIS A ANTIBODY, TOTAL: hep A Total Ab: REACTIVE — AB

## 2019-01-05 LAB — TSH: TSH: 0.512 u[IU]/mL (ref 0.350–4.500)

## 2019-01-05 MED ORDER — SODIUM CHLORIDE 0.9 % IV SOLN
200.0000 mg | Freq: Once | INTRAVENOUS | Status: AC
  Administered 2019-01-05: 17:00:00 200 mg via INTRAVENOUS
  Filled 2019-01-05: qty 40

## 2019-01-05 MED ORDER — SODIUM CHLORIDE 0.9 % IV SOLN
1000.0000 mL | INTRAVENOUS | Status: DC
  Administered 2019-01-05 – 2019-01-06 (×3): 1000 mL via INTRAVENOUS

## 2019-01-05 MED ORDER — ONDANSETRON HCL 4 MG/2ML IJ SOLN
4.0000 mg | Freq: Four times a day (QID) | INTRAMUSCULAR | Status: DC | PRN
  Administered 2019-01-09: 4 mg via INTRAVENOUS
  Filled 2019-01-05: qty 2

## 2019-01-05 MED ORDER — DEXAMETHASONE SODIUM PHOSPHATE 10 MG/ML IJ SOLN
10.0000 mg | Freq: Once | INTRAMUSCULAR | Status: AC
  Administered 2019-01-05: 10 mg via INTRAVENOUS
  Filled 2019-01-05: qty 1

## 2019-01-05 MED ORDER — SODIUM CHLORIDE 0.9 % IV SOLN
100.0000 mg | INTRAVENOUS | Status: AC
  Administered 2019-01-06 – 2019-01-09 (×4): 100 mg via INTRAVENOUS
  Filled 2019-01-05 (×4): qty 20

## 2019-01-05 MED ORDER — INSULIN ASPART 100 UNIT/ML ~~LOC~~ SOLN
0.0000 [IU] | Freq: Three times a day (TID) | SUBCUTANEOUS | Status: DC
  Administered 2019-01-06: 1 [IU] via SUBCUTANEOUS
  Administered 2019-01-08: 13:00:00 2 [IU] via SUBCUTANEOUS

## 2019-01-05 MED ORDER — ENOXAPARIN SODIUM 40 MG/0.4ML ~~LOC~~ SOLN
40.0000 mg | SUBCUTANEOUS | Status: DC
  Administered 2019-01-06: 40 mg via SUBCUTANEOUS
  Filled 2019-01-05 (×2): qty 0.4

## 2019-01-05 MED ORDER — DEXAMETHASONE 6 MG PO TABS
6.0000 mg | ORAL_TABLET | ORAL | Status: DC
  Administered 2019-01-05 – 2019-01-10 (×6): 6 mg via ORAL
  Filled 2019-01-05 (×2): qty 1
  Filled 2019-01-05: qty 2
  Filled 2019-01-05 (×3): qty 1

## 2019-01-05 MED ORDER — SODIUM CHLORIDE 0.9% FLUSH
3.0000 mL | Freq: Two times a day (BID) | INTRAVENOUS | Status: DC
  Administered 2019-01-05 – 2019-01-11 (×12): 3 mL via INTRAVENOUS

## 2019-01-05 MED ORDER — ONDANSETRON HCL 4 MG PO TABS: 4.0000 mg | ORAL_TABLET | Freq: Four times a day (QID) | ORAL | Status: DC | PRN

## 2019-01-05 MED ORDER — LEVOTHYROXINE SODIUM 50 MCG PO TABS
100.0000 ug | ORAL_TABLET | Freq: Every day | ORAL | Status: DC
  Administered 2019-01-06 – 2019-01-11 (×6): 100 ug via ORAL
  Filled 2019-01-05 (×6): qty 2

## 2019-01-05 MED ORDER — PANTOPRAZOLE SODIUM 40 MG PO TBEC
40.0000 mg | DELAYED_RELEASE_TABLET | Freq: Every evening | ORAL | Status: DC
  Administered 2019-01-05 – 2019-01-10 (×6): 40 mg via ORAL
  Filled 2019-01-05 (×7): qty 1

## 2019-01-05 MED ORDER — ACETAMINOPHEN 500 MG PO TABS
500.0000 mg | ORAL_TABLET | Freq: Four times a day (QID) | ORAL | Status: DC | PRN
  Administered 2019-01-06: 500 mg via ORAL
  Filled 2019-01-05 (×2): qty 1

## 2019-01-05 NOTE — ED Notes (Signed)
Phil-([facility]Transfer to [facility]) called/already on List for pickup @ 1828-per Phil, RN called by Levada Dy

## 2019-01-05 NOTE — ED Notes (Signed)
Report given to [place] rn.

## 2019-01-05 NOTE — ED Notes (Signed)
Lab called RN for lactic of 2.5

## 2019-01-05 NOTE — ED Notes (Signed)
Pt wife [name] [phone]

## 2019-01-05 NOTE — ED Provider Notes (Signed)
[facility] EMERGENCY DEPARTMENT Provider Note   CSN: [id number] Arrival date & time: 01/05/19  1131     History   Chief Complaint Chief Complaint  Patient presents with  . Shortness of Breath    possible COVID    HPI BRYSEN Richmond is a 68 y.o. male.     Pt presents to the ED today with sob.  Pt has been sick for about a week.  He was seen in the ED on 10/20 for abdominal pain.  He had a work up and CT that was negative for anything acute.  CXR then was also negative.  The pt has continued to have some abdominal pain; now with diarrhea.  Pt has also developed SOB.  O2 sat on RA by EMS 81%.  EMS put him on 4L oxygen via [place].  The pt had a fever of 102 PTA.  Pt's wife gave him 2 tylenol pills.  The pt denies any known Covid exposures.  He was tested at the [facility] yesterday, but result is not yet back.     Past Medical History:  Diagnosis Date  . Diabetes mellitus without complication ([place])   . GERD (gastroesophageal reflux disease)   . Hyperlipidemia   . Thyroid disease     Patient Active Problem List   Diagnosis Date Noted  . CAP (community acquired pneumonia) 08/10/2015  . Elevated blood pressure 08/10/2015  . Lower leg edema 08/10/2015  . Tobacco use disorder 08/10/2015    Past Surgical History:  Procedure Laterality Date  . THYROID SURGERY          Home Medications    Prior to Admission medications   Medication Sig Start Date End Date Taking? Authorizing Provider  acetaminophen (TYLENOL) 500 MG tablet Take 1 tablet (500 mg total) by mouth every 6 (six) hours as needed. 01/02/19   Corena Herter, PA-C  diphenoxylate-atropine (LOMOTIL) 2.5-0.025 MG tablet Take 1 tablet by mouth 4 (four) times daily as needed for diarrhea or loose stools.    [provider]  hydrOXYzine (ATARAX/VISTARIL) 25 MG tablet Take 1 tablet (25 mg total) by mouth at bedtime as needed for up to 21 days (Sleep). 01/02/19 01/23/19  Corena Herter, PA-C  ibuprofen  (ADVIL,MOTRIN) 800 MG tablet Take 1 tablet (800 mg total) by mouth 3 (three) times daily. 02/17/16   Fransico Meadow, PA-C  methocarbamol (ROBAXIN) 500 MG tablet Take 1 tablet (500 mg total) by mouth 4 (four) times daily. 02/17/16   Fransico Meadow, PA-C  oxyCODONE-acetaminophen (PERCOCET/ROXICET) 5-325 MG tablet Take 2 tablets by mouth every 4 (four) hours as needed for severe pain. 02/17/16   Fransico Meadow, PA-C  pantoprazole (PROTONIX) 40 MG tablet Take 40 mg by mouth every evening.    [provider]    Family History History reviewed. No pertinent family history.  Social History Social History   Tobacco Use  . Smoking status: Current Every Day Smoker    Types: Cigarettes  . Smokeless tobacco: Never Used  Substance Use Topics  . Alcohol use: Yes    Comment: occasiona;  [name] Drug use: Not Currently    Types: Cocaine     Allergies   Patient has no known allergies.   Review of Systems Review of Systems  Respiratory: Positive for cough and shortness of breath.   Gastrointestinal: Positive for abdominal pain and diarrhea.  All other systems reviewed and are negative.    Physical Exam Updated Vital Signs BP [name])  143/68   Pulse 86   Temp 99.6 F (37.6 C) (Oral)   Resp (!) 28   Ht 6\' 2"  (1.88 m)   Wt (!) 149.7 kg   SpO2 97%   BMI 42.37 kg/m   Physical Exam Vitals signs and nursing note reviewed.  Constitutional:      Appearance: He is well-developed. He is obese.  HENT:     Head: Normocephalic and atraumatic.     Mouth/Throat:     Mouth: Mucous membranes are moist.     Pharynx: Oropharynx is clear.  Eyes:     Extraocular Movements: Extraocular movements intact.     Pupils: Pupils are equal, round, and reactive to light.  Neck:     Musculoskeletal: Normal range of motion and neck supple.  Cardiovascular:     Rate and Rhythm: Normal rate and regular rhythm.  Pulmonary:     Effort: Tachypnea present.  Abdominal:     General: Bowel sounds are normal.      Palpations: Abdomen is soft.  Musculoskeletal: Normal range of motion.     Right lower leg: Edema present.     Left lower leg: Edema present.  Skin:    General: Skin is warm.     Capillary Refill: Capillary refill takes less than 2 seconds.  Neurological:     General: No focal deficit present.     Mental Status: He is alert and oriented to person, place, and time.  Psychiatric:        Mood and Affect: Mood normal.        Behavior: Behavior normal.      ED Treatments / Results  Labs (all labs ordered are listed, but only abnormal results are displayed) Labs Reviewed  SARS CORONAVIRUS 2 BY RT PCR ([facility]) - Abnormal; Notable for the following components:      Result Value   SARS Coronavirus 2 POSITIVE (*)    All other components within normal limits  LACTIC ACID, PLASMA - Abnormal; Notable for the following components:   Lactic Acid, Venous 2.5 (*)    All other components within normal limits  CBC WITH DIFFERENTIAL/PLATELET - Abnormal; Notable for the following components:   Platelets 145 (*)    Neutro Abs 7.8 (*)    Lymphs Abs 0.6 (*)    All other components within normal limits  COMPREHENSIVE METABOLIC PANEL - Abnormal; Notable for the following components:   Sodium 128 (*)    Chloride 97 (*)    CO2 20 (*)    Glucose, Bld 125 (*)    Creatinine, Ser 1.41 (*)    Calcium 8.2 (*)    Albumin 3.0 (*)    AST 126 (*)    ALT 75 (*)    GFR calc non Af Amer 50 (*)    GFR calc Af Amer 58 (*)    All other components within normal limits  LACTATE DEHYDROGENASE - Abnormal; Notable for the following components:   LDH 370 (*)    All other components within normal limits  FERRITIN - Abnormal; Notable for the following components:   Ferritin 2,318 (*)    All other components within normal limits  C-REACTIVE PROTEIN - Abnormal; Notable for the following components:   CRP 25.2 (*)    All other components within normal limits  CULTURE,  BLOOD (ROUTINE X 2)  CULTURE, BLOOD (ROUTINE X 2)  PROCALCITONIN  TRIGLYCERIDES  LACTIC ACID, PLASMA  D-DIMER, QUANTITATIVE (NOT AT  [place])  FIBRINOGEN    EKG EKG Interpretation  Date/Time:  Friday January 05 2019 12:08:53 EDT Ventricular Rate:  91 PR Interval:    QRS Duration: 84 QT Interval:  339 QTC Calculation: 417 R Axis:   63 Text Interpretation:  Sinus rhythm Short PR interval Borderline T wave abnormalities No significant change since last tracing Confirmed by Isla Pence [phone]) on 01/05/2019 12:23:53 PM   Radiology Dg Chest Port 1 View  Result Date: 01/05/2019 CLINICAL DATA:  Shortness of breath EXAM: PORTABLE CHEST 1 VIEW COMPARISON:  01/02/2019 FINDINGS: Bullous emphysematous changes within the lungs. Increased markings in the lung bases, likely scarring although acute infiltrates cannot be completely excluded. Heart is normal size. No effusions. IMPRESSION: COPD/chronic changes. Increased markings in the lung bases could reflect scarring although superimposed acute infiltrates cannot be completely excluded. Electronically Signed   By: Rolm Baptise M.D.   On: 01/05/2019 12:26    Procedures Procedures (including critical care time)  Medications Ordered in ED Medications  0.9 %  sodium chloride infusion (1,000 mLs Intravenous New Bag/Given 01/05/19 1345)  dexamethasone (DECADRON) injection 10 mg (has no administration in time range)     Initial Impression / Assessment and Plan / ED Course  I have reviewed the triage vital signs and the nursing notes.  Pertinent labs & imaging results that were available during my care of the patient were reviewed by me and considered in my medical decision making (see chart for details).        Pt placed on oxygen (4L) upon arrival.  He is now saturating 96%.  Pt is positive for Covid.  He is unsure how he got this infection. CXR shows no pna.  He will be given decadron to help with the sob.  Pt d/w Dr. Daryll Drown (triad) for  admission.  Larry Richmond was evaluated in Emergency Department on 01/05/2019 for the symptoms described in the history of present illness. He was evaluated in the context of the global COVID-19 pandemic, which necessitated consideration that the patient might be at risk for infection with the SARS-CoV-2 virus that causes COVID-19. Institutional protocols and algorithms that pertain to the evaluation of patients at risk for COVID-19 are in a state of rapid change based on information released by regulatory bodies including the CDC and federal and state organizations. These policies and algorithms were followed during the patient's care in the ED.    Final Clinical Impressions(s) / ED Diagnoses   Final diagnoses:  COVID-19 virus infection  Hypoxia    ED Discharge Orders    None       Isla Pence, MD 01/05/19 1433

## 2019-01-05 NOTE — H&P (Signed)
History and Physical    Larry Richmond [id number] DOB: 1950-12-23 DOA: 01/05/2019  [facility]  Chief Complaint: SOB  HPI: Larry Richmond is a 68 y.o. male with medical history significant of HTN, thyroidectomy for goiter, GERD, prediabetes who presents for SOB X 1 day.  Larry Richmond reports that he began to have abdominal pain, diarrhea and nausea about 5 days ago.  He presented to the ED on 10/20 and had a CT scan of his abdomen which did not show any acute pathology, some chronic changes only.  Hhe was advised to discontinue mobic and to start taking protonix at night-time.  Within the last day, however, he developed SOB and hypoxia on room air.  He also had a fever of 102F prior to admission.  He did get tested for COVID at the [facility] yesterday, but the result was not back yet.  He was tested in the ED today and was positive.    Discussed case with his wife, Larry Richmond [phone], and she gave some information about his medications.  She was in the ED as well and was awaiting a room.   ED Course: In the ED, he was found to be hypoxic, CXR showed chronic changes, acute infiltrates could not be ruled out, appeared similar to CXR from 3 days prior.  Inflammatory labs were all elevated including LDH of 370, Ferritin of 2300, CRP of 25, fibrinogen of > 800 and D Dimer of 1.12.  WBC was 8.6. PCT was 1.94.  Na of 128.  Renal function worsened to 1.41 Creatinine.    Review of Systems: As per HPI otherwise 10 point review of systems negative except for some fatigue and nervousness about his diagnosis.  No chest pain.     Past Medical History:  Diagnosis Date   Diabetes mellitus without complication (HCC)    GERD (gastroesophageal reflux disease)    Hyperlipidemia    Thyroid disease     Past Surgical History:  Procedure Laterality Date   THYROID SURGERY       reports that he has quit smoking. His smoking use included cigarettes. He has never used smokeless tobacco. He  reports current alcohol use. He reports previous drug use. Drug: Cocaine.  No Known Allergies  Family History  Problem Relation Age of Onset   Alzheimer's disease Mother      Prior to Admission medications   Medication Sig Start Date End Date Taking? Authorizing Provider  acetaminophen (TYLENOL) 500 MG tablet Take 1 tablet (500 mg total) by mouth every 6 (six) hours as needed. 01/02/19   Corena Herter, PA-C         hydrOXYzine (ATARAX/VISTARIL) 25 MG tablet Take 1 tablet (25 mg total) by mouth at bedtime as needed for up to 21 days (Sleep). 01/02/19 01/23/19  Corena Herter, PA-C                       pantoprazole (PROTONIX) 40 MG tablet Take 40 mg by mouth every evening.    [provider]  Reported to be on synthroid 170mcg daily, metformin (unknown dose).  Reviewed with Wife.  She was not able to give a thorough review given she was being admitted to the ED as well.   Physical Exam: Constitutional: NAD, calm, comfortable, sitting up in bed Vitals:   01/05/19 1415 01/05/19 1430 01/05/19 1445 01/05/19 1615  BP: (!) 141/75 138/85 134/74 136/74  Pulse: 81 86 86 83  Resp: 17 (!) 22 20 (!) 24  Temp:      TempSrc:      SpO2: 93% 94% 93% 93%  Weight:      Height:       Eyes: PERRL, lids and conjunctivae normal ENMT: Mucous membranes are moist. Posterior pharynx clear of any exudate or lesions.  Neck: normal, supple Respiratory: Crackles at bases, no wheezing, otherwise clear, no increased WOB Cardiovascular: RR, NR, no murmur noted Abdomen: + TTP throughout, mild, no rebound, no guarding, +BS Musculoskeletal: extremities are well perfused, normal muscle tone for age.  Skin: no rashes, lesions, ulcers on exposed skin Neurologic: Grossly intact, moving easily in bed Psychiatric: Normal judgment and insight. Alert and oriented x 3. Normal mood.   Labs on Admission: I have personally reviewed following labs and imaging studies  CBC: Recent Labs  Lab  01/02/19 1057 01/05/19 1146  WBC 5.3 8.6  NEUTROABS  --  7.8*  HGB 14.7 14.5  HCT 46.9 42.9  MCV 94.9 90.5  PLT 141* [id number]*   Basic Metabolic Panel: Recent Labs  Lab 01/02/19 1057 01/05/19 1146  NA 130* 128*  K 3.9 4.0  CL 102 97*  CO2 17* 20*  GLUCOSE 109* 125*  BUN 14 16  CREATININE 1.27* 1.41*  CALCIUM 8.4* 8.2*   GFR: Estimated Creatinine Clearance: 75.3 mL/min (A) (by C-G formula based on SCr of 1.41 mg/dL (H)). Liver Function Tests: Recent Labs  Lab 01/02/19 1057 01/05/19 1146  AST 28 126*  ALT 20 75*  ALKPHOS 47 77  BILITOT 1.0 1.0  PROT 8.0 6.9  ALBUMIN 3.7 3.0*   Recent Labs  Lab 01/02/19 1057  LIPASE 19   No results for input(s): AMMONIA in the last 168 hours. Coagulation Profile: No results for input(s): INR, PROTIME in the last 168 hours. Cardiac Enzymes: No results for input(s): CKTOTAL, CKMB, CKMBINDEX, TROPONINI in the last 168 hours. BNP (last 3 results) No results for input(s): PROBNP in the last 8760 hours. HbA1C: No results for input(s): HGBA1C in the last 72 hours. CBG: No results for input(s): GLUCAP in the last 168 hours. Lipid Profile: Recent Labs    01/05/19 1146  TRIG 105   Thyroid Function Tests: No results for input(s): TSH, T4TOTAL, FREET4, T3FREE, THYROIDAB in the last 72 hours. Anemia Panel: Recent Labs    01/05/19 1146  FERRITIN 2,318*   Urine analysis:    Component Value Date/Time   COLORURINE AMBER (A) 01/02/2019 1252   APPEARANCEUR HAZY (A) 01/02/2019 1252   LABSPEC 1.025 01/02/2019 1252   PHURINE 5.0 01/02/2019 [place] 01/02/2019 1252   [place] 01/02/2019 1252   BILIRUBINUR NEGATIVE 01/02/2019 1252   KETONESUR 5 (A) 01/02/2019 1252   PROTEINUR 30 (A) 01/02/2019 1252   NITRITE NEGATIVE 01/02/2019 1252   LEUKOCYTESUR NEGATIVE 01/02/2019 1252    Radiological Exams on Admission: Dg Chest Port 1 View  Result Date: 01/05/2019 CLINICAL DATA:  Shortness of breath EXAM: PORTABLE  CHEST 1 VIEW COMPARISON:  01/02/2019 FINDINGS: Bullous emphysematous changes within the lungs. Increased markings in the lung bases, likely scarring although acute infiltrates cannot be completely excluded. Heart is normal size. No effusions. IMPRESSION: COPD/chronic changes. Increased markings in the lung bases could reflect scarring although superimposed acute infiltrates cannot be completely excluded. Electronically Signed   By: Rolm Baptise M.D.   On: 01/05/2019 12:26    EKG: Independently reviewed. NSR.  TW flattening in lateral leads.   Assessment/Plan  Pneumonia related to  2019 novel coronavirus disease (COVID-19) - Admit to [facility] for further monitoring - Continue oxygen at 4L to keep O2 saturation > 92% - Airborne and droplet precautions - Steroids for 10 days - Remdesivir - Considered actemra, but given transaminitis and equivocal CXR, will hold off for now - Repeat CXR in the AM - Trend inflammatory labs  Hyponatremia, mild AKI (baseline 1.2) - Has been given IVF so urinary labs may not be consistent at this time.  - Regardless, check urine Na, urine Osm and Osmolality to further investigate - Trend Cr  Transaminitis - Check Hepatitis A given his 5 days of abdominal pain, diarrhea - Check hepatitis C antibody and Hepatitis B labs - Trend after fluids  Hypothyroidism, post-surgical - Family reported he is on 152mcg of synthroid, will start this - Check TSH  Pre-diabetes - Hold home metformin - SSI  GERD - Continue protonix.    DVT prophylaxis: Lovenox Code Status: Full Family Communication: Wife Mary, brief discussion  Disposition Plan: Admit to [place] called: None Admission status: Admit for treatment   Gilles Chiquito MD [facility]  If 7PM-7AM, please contact night-coverage [email or web address] Password [facility]  01/05/2019, 4:35 PM

## 2019-01-05 NOTE — ED Notes (Signed)
md at bedside

## 2019-01-05 NOTE — ED Triage Notes (Signed)
Pt BIB ems from home for SOB, N/V/D, fever that began 5 days ago. Pt has had no known sick contacts, but went to [place] to get COVID tested yesterday, result unknown. Pt had temp 102 at home, wife gave tylenol. VSS, 90% on RA

## 2019-01-05 NOTE — Progress Notes (Signed)
Notified Dr. Loralee Pacas of patient arrival to [facility]. No response or new orders yet. Patient stable and has eaten a tv dinner.

## 2019-01-05 NOTE — ED Notes (Signed)
Dinner Tray Ordered @ 1803.  

## 2019-01-06 DIAGNOSIS — U071 COVID-19: Principal | ICD-10-CM

## 2019-01-06 DIAGNOSIS — R0902 Hypoxemia: Secondary | ICD-10-CM

## 2019-01-06 DIAGNOSIS — E871 Hypo-osmolality and hyponatremia: Secondary | ICD-10-CM

## 2019-01-06 LAB — CBC
HCT: 39.4 % (ref 39.0–52.0)
Hemoglobin: 12.9 g/dL — ABNORMAL LOW (ref 13.0–17.0)
MCH: 29.5 pg (ref 26.0–34.0)
MCHC: 32.7 g/dL (ref 30.0–36.0)
MCV: 90 fL (ref 80.0–100.0)
Platelets: 152 10*3/uL (ref 150–400)
RBC: 4.38 MIL/uL (ref 4.22–5.81)
RDW: 14.6 % (ref 11.5–15.5)
WBC: 7.9 10*3/uL (ref 4.0–10.5)
nRBC: 0 % (ref 0.0–0.2)

## 2019-01-06 LAB — COMPREHENSIVE METABOLIC PANEL
ALT: 74 U/L — ABNORMAL HIGH (ref 0–44)
AST: 107 U/L — ABNORMAL HIGH (ref 15–41)
Albumin: 3 g/dL — ABNORMAL LOW (ref 3.5–5.0)
Alkaline Phosphatase: 62 U/L (ref 38–126)
Anion gap: 11 (ref 5–15)
BUN: 18 mg/dL (ref 8–23)
CO2: 21 mmol/L — ABNORMAL LOW (ref 22–32)
Calcium: 7.9 mg/dL — ABNORMAL LOW (ref 8.9–10.3)
Chloride: 97 mmol/L — ABNORMAL LOW (ref 98–111)
Creatinine, Ser: 1.06 mg/dL (ref 0.61–1.24)
GFR calc Af Amer: >60 mL/min (ref >60)
GFR calc non Af Amer: >60 mL/min (ref >60)
Glucose, Bld: 123 mg/dL — ABNORMAL HIGH (ref 70–99)
Potassium: 3.9 mmol/L (ref 3.5–5.1)
Sodium: 129 mmol/L — ABNORMAL LOW (ref 135–145)
Total Bilirubin: 1 mg/dL (ref 0.3–1.2)
Total Protein: 7.4 g/dL (ref 6.5–8.1)

## 2019-01-06 LAB — C-REACTIVE PROTEIN: CRP: 23.7 mg/dL — ABNORMAL HIGH (ref <1.0)

## 2019-01-06 LAB — GLUCOSE, CAPILLARY
Glucose-Capillary: 102 mg/dL — ABNORMAL HIGH (ref 70–99)
Glucose-Capillary: 125 mg/dL — ABNORMAL HIGH (ref 70–99)
Glucose-Capillary: 105 mg/dL — ABNORMAL HIGH (ref 70–99)

## 2019-01-06 LAB — HEPATITIS A ANTIBODY, IGM
Hep A IgM: UNDETERMINED — AB
Hep A IgM: NONREACTIVE

## 2019-01-06 LAB — TYPE AND SCREEN
ABO/RH(D): A POS
Antibody Screen: NEGATIVE

## 2019-01-06 LAB — HEPATITIS A ANTIBODY, TOTAL: hep A Total Ab: REACTIVE — AB

## 2019-01-06 LAB — D-DIMER, QUANTITATIVE: D-Dimer, Quant: 3.27 ug/mL-FEU — ABNORMAL HIGH (ref 0.00–0.50)

## 2019-01-06 MED ORDER — INFLUENZA VAC A&B SA ADJ QUAD 0.5 ML IM PRSY
0.5000 mL | PREFILLED_SYRINGE | INTRAMUSCULAR | Status: AC
  Administered 2019-01-11: 12:00:00 0.5 mL via INTRAMUSCULAR
  Filled 2019-01-06 (×2): qty 0.5

## 2019-01-06 MED ORDER — ENOXAPARIN SODIUM 80 MG/0.8ML ~~LOC~~ SOLN
75.0000 mg | SUBCUTANEOUS | Status: DC
  Administered 2019-01-06 – 2019-01-10 (×5): 75 mg via SUBCUTANEOUS
  Filled 2019-01-06 (×5): qty 0.8

## 2019-01-06 MED ORDER — PNEUMOCOCCAL VAC POLYVALENT 25 MCG/0.5ML IJ INJ
0.5000 mL | INJECTION | INTRAMUSCULAR | Status: AC
  Administered 2019-01-11: 12:00:00 0.5 mL via INTRAMUSCULAR
  Filled 2019-01-06 (×2): qty 0.5

## 2019-01-06 MED ORDER — ENSURE ENLIVE PO LIQD
237.0000 mL | Freq: Two times a day (BID) | ORAL | Status: DC
  Administered 2019-01-06 – 2019-01-11 (×11): 237 mL via ORAL

## 2019-01-06 MED ORDER — SODIUM CHLORIDE 0.9% IV SOLUTION
Freq: Once | INTRAVENOUS | Status: AC
  Administered 2019-01-06: 19:00:00 via INTRAVENOUS

## 2019-01-06 NOTE — Progress Notes (Signed)
PROGRESS NOTE  Larry Richmond [id number] DOB: August 24, 1950 DOA: 01/05/2019 PCP: Default, Provider, MD   LOS: 1 day   Brief Narrative / Interim history: 68 y.o. male with medical history significant of HTN, thyroidectomy for goiter, GERD, prediabetes who presents for SOB X 1 day.  [name] reports that he began to have abdominal pain, diarrhea and nausea about 5 days ago.  He presented to the ED on 10/20 and had a CT scan of his abdomen which did not show any acute pathology, some chronic changes only.  Hhe was advised to discontinue mobic and to start taking protonix at night-time.  Within the last day, however, he developed SOB and hypoxia on room air.  He also had a fever of 102F prior to admission.  He did get tested for COVID at the [facility] yesterday, but the result was not back yet.  He was tested in the ED today and was positive.    Subjective / 24h Interval events: Feeling little bit better this morning than in the ED yesterday.  Still has shortness of breath with activity.  Denies any chest pain, denies any abdominal pain, nausea or vomiting.  Assessment & Plan: Active Problems:   COVID-19 virus infection  Principal Problem Acute hypoxic respiratory failure due to COVID-19 pneumonia -Patient's respiratory status stable, was admitted on 4 L and has remained on 4 L, clinically he appreciates improvement -CRP significantly elevated, low threshold for Actemra should his O2 needs increased -Started on remdesivir, Decadron, consented for plasma today  COVID-19 Labs  Recent Labs    01/05/19 1146 01/05/19 1343 01/06/19 0709  DDIMER  --  1.12* 3.27*  FERRITIN 2,318*  --   --   LDH 370*  --   --   CRP 25.2*  --  23.7*    Lab Results  Component Value Date   SARSCOV2NAA POSITIVE (A) 01/05/2019    Active Problems Hyponatremia, mild AKI (baseline 1.2) -Mild, overall stable.  Received fluids.  Continue to monitor, encourage p.o. intake -Creatinine has normalized today 1.0   Transaminitis -Check Hepatitis A given his 5 days of abdominal pain, diarrhea, result equivocal this morning, repeat hep A IgM and total antibiotics -hepatitis C antibody and Hepatitis B labs negative -LFTs slightly better today  Hypothyroidism, post-surgical -Family reported he is on 145mcg of synthroid, will start this -TSH normal  Pre-diabetes -Hold home metformin -SSI  GERD -Continue protonix.   Scheduled Meds: . sodium chloride   Intravenous Once  . dexamethasone  6 mg Oral Q24H  . enoxaparin (LOVENOX) injection  75 mg Subcutaneous Q24H  . feeding supplement (ENSURE ENLIVE)  237 mL Oral BID BM  . [START ON 01/07/2019] influenza vaccine adjuvanted  0.5 mL Intramuscular Tomorrow-1000  . insulin aspart  0-9 Units Subcutaneous TID WC  . levothyroxine  100 mcg Oral Q0600  . pantoprazole  40 mg Oral QPM  . [START ON 01/07/2019] pneumococcal 23 valent vaccine  0.5 mL Intramuscular Tomorrow-1000  . sodium chloride flush  3 mL Intravenous Q12H   Continuous Infusions: . remdesivir 100 mg in NS 250 mL     PRN Meds:.acetaminophen, ondansetron **OR** ondansetron (ZOFRAN) IV  DVT prophylaxis: Lovenox Code Status: Full code Family Communication: d/w patient  Disposition Plan: home when ready   Consultants:  None   Procedures:  None   Microbiology  None   Antimicrobials: None     Objective: Vitals:   01/05/19 2130 01/05/19 2208 01/06/19 0447 01/06/19 0817  BP: 132/71 131/76 (!) 147/76  138/89  Pulse: 68   81  Resp: (!) 23 (!) 22  17  Temp:  98.5 F (36.9 C) 97.7 F (36.5 C) 98.9 F (37.2 C)  TempSrc:  Oral Oral Oral  SpO2: 97% 94%  90%  Weight:  (!) 151.3 kg    Height:  6\' 2"  (1.88 m)      Intake/Output Summary (Last 24 hours) at 01/06/2019 1044 Last data filed at 01/06/2019 0517 Gross per 24 hour  Intake 745 ml  Output 200 ml  Net 545 ml   Filed Weights   01/05/19 1145 01/05/19 2208  Weight: (!) 149.7 kg (!) 151.3 kg    Examination:   Constitutional: NAD Eyes: lids and conjunctivae normal ENMT: Mucous membranes are moist. Neck: normal, supple Respiratory: bibasilar rhonchi, no wheezing, no crackles.  Tachypneic, moves air well Cardiovascular: Regular rate and rhythm, no murmurs / rubs / gallops. No LE edema. Abdomen: no tenderness. Bowel sounds positive.  Musculoskeletal: no clubbing / cyanosis.  Skin: no rashes Neurologic: non focal Psychiatric: Normal judgment and insight.   Data Reviewed: I have independently reviewed following labs and imaging studies   CBC: Recent Labs  Lab 01/02/19 1057 01/05/19 1146 01/05/19 2101 01/06/19 0709  WBC 5.3 8.6 7.3 7.9  NEUTROABS  --  7.8*  --   --   HGB 14.7 14.5 13.9 12.9*  HCT 46.9 42.9 42.8 39.4  MCV 94.9 90.5 92.6 90.0  PLT 141* 145* 149* 0000000   Basic Metabolic Panel: Recent Labs  Lab 01/02/19 1057 01/05/19 1146 01/05/19 2101 01/06/19 0709  NA 130* 128*  --  129*  K 3.9 4.0  --  3.9  CL 102 97*  --  97*  CO2 17* 20*  --  21*  GLUCOSE 109* 125*  --  123*  BUN 14 16  --  18  CREATININE 1.27* 1.41* 1.29* 1.06  CALCIUM 8.4* 8.2*  --  7.9*   GFR: Estimated Creatinine Clearance: 100.7 mL/min (by C-G formula based on SCr of 1.06 mg/dL). Liver Function Tests: Recent Labs  Lab 01/02/19 1057 01/05/19 1146 01/06/19 0709  AST 28 126* 107*  ALT 20 75* 74*  ALKPHOS 47 77 62  BILITOT 1.0 1.0 1.0  PROT 8.0 6.9 7.4  ALBUMIN 3.7 3.0* 3.0*   Recent Labs  Lab 01/02/19 1057  LIPASE 19   No results for input(s): AMMONIA in the last 168 hours. Coagulation Profile: No results for input(s): INR, PROTIME in the last 168 hours. Cardiac Enzymes: No results for input(s): CKTOTAL, CKMB, CKMBINDEX, TROPONINI in the last 168 hours. BNP (last 3 results) No results for input(s): PROBNP in the last 8760 hours. HbA1C: Recent Labs    01/05/19 2101  HGBA1C 6.0*   CBG: Recent Labs  Lab 01/05/19 2211 01/06/19 0815  GLUCAP 119* 102*   Lipid Profile: Recent Labs     01/05/19 1146  TRIG 105   Thyroid Function Tests: Recent Labs    01/05/19 2101  TSH 0.512   Anemia Panel: Recent Labs    01/05/19 1146  FERRITIN 2,318*   Urine analysis:    Component Value Date/Time   COLORURINE AMBER (A) 01/02/2019 1252   APPEARANCEUR HAZY (A) 01/02/2019 1252   LABSPEC 1.025 01/02/2019 1252   PHURINE 5.0 01/02/2019 1252   [place] 01/02/2019 1252   [place] 01/02/2019 1252   [place] 01/02/2019 1252   KETONESUR 5 (A) 01/02/2019 1252   PROTEINUR 30 (A) 01/02/2019 1252   NITRITE NEGATIVE 01/02/2019 1252  LEUKOCYTESUR NEGATIVE 01/02/2019 1252   Sepsis Labs: Invalid input(s): PROCALCITONIN, LACTICIDVEN  Recent Results (from the past 240 hour(s))  SARS Coronavirus 2 by RT PCR (hospital order, performed in [facility] hospital lab) Nasopharyngeal Nasopharyngeal Swab     Status: Abnormal   Collection Time: 01/05/19 11:46 AM   Specimen: Nasopharyngeal Swab  Result Value Ref Range Status   SARS Coronavirus 2 POSITIVE (A) NEGATIVE Final    Comment: RESULT CALLED TO, READ BACK BY AND VERIFIED WITH: Bethena Midget RN 14:10 01/05/19 ([email or web address]) (NOTE) If result is NEGATIVE SARS-CoV-2 target nucleic acids are NOT DETECTED. The SARS-CoV-2 RNA is generally detectable in upper and lower  respiratory specimens during the acute phase of infection. The lowest  concentration of SARS-CoV-2 viral copies this assay can detect is 250  copies / mL. A negative result does not preclude SARS-CoV-2 infection  and should not be used as the sole basis for treatment or other  patient management decisions.  A negative result may occur with  improper specimen collection / handling, submission of specimen other  than nasopharyngeal swab, presence of viral mutation(s) within the  areas targeted by this assay, and inadequate number of viral copies  (<250 copies / mL). A negative result must be combined with clinical  observations, patient history, and  epidemiological information. If result is POSITIVE SARS-CoV-2 target nucleic acids are DETECTED.  The SARS-CoV-2 RNA is generally detectable in upper and lower  respiratory specimens during the acute phase of infection.  Positive  results are indicative of active infection with SARS-CoV-2.  Clinical  correlation with patient history and other diagnostic information is  necessary to determine patient infection status.  Positive results do  not rule out bacterial infection or co-infection with other viruses. If result is PRESUMPTIVE POSTIVE SARS-CoV-2 nucleic acids MAY BE PRESENT.   A presumptive positive result was obtained on the submitted specimen  and confirmed on repeat testing.  While 2019 novel coronavirus  (SARS-CoV-2) nucleic acids may be present in the submitted sample  additional confirmatory testing may be necessary for epidemiological  and / or clinical management purposes  to differentiate between  SARS-CoV-2 and other Sarbecovirus currently known to infect humans.  If clinically indicated additional testing with an alternate test  methodology [phone]) i s advised. The SARS-CoV-2 RNA is generally  detectable in upper and lower respiratory specimens during the acute  phase of infection. The expected result is Negative. Fact Sheet for Patients:  StrictlyIdeas.no Fact Sheet for Healthcare Providers: BankingDealers.co.za This test is not yet approved or cleared by the [place] FDA and has been authorized for detection and/or diagnosis of SARS-CoV-2 by FDA under an Emergency Use Authorization (EUA).  This EUA will remain in effect (meaning this test can be used) for the duration of the COVID-19 declaration under Section 564(b)(1) of the Act, 21 U.S.C. section 360bbb-3(b)(1), unless the authorization is terminated or revoked sooner. Performed at [facility], [place] [address], [place], [place] [address]   Blood Culture  (routine x 2)     Status: None (Preliminary result)   Collection Time: 01/05/19  1:00 PM   Specimen: BLOOD  Result Value Ref Range Status   Specimen Description BLOOD SITE NOT SPECIFIED  Final   Special Requests   Final    BOTTLES DRAWN AEROBIC AND ANAEROBIC Blood Culture adequate volume   Culture   Final    NO GROWTH < 24 HOURS Performed at [facility], [place] [address] [place], [place] [address]  Report Status PENDING  Incomplete  Blood Culture (routine x 2)     Status: None (Preliminary result)   Collection Time: 01/05/19  1:00 PM   Specimen: BLOOD  Result Value Ref Range Status   Specimen Description BLOOD SITE NOT SPECIFIED  Final   Special Requests   Final    BOTTLES DRAWN AEROBIC ONLY Blood Culture results may not be optimal due to an inadequate volume of blood received in culture bottles   Culture   Final    NO GROWTH < 24 HOURS Performed at [facility], [address], [place], [place] [address]    Report Status PENDING  Incomplete      Radiology Studies: Dg Chest Port 1 View  Result Date: 01/05/2019 CLINICAL DATA:  Shortness of breath EXAM: PORTABLE CHEST 1 VIEW COMPARISON:  01/02/2019 FINDINGS: Bullous emphysematous changes within the lungs. Increased markings in the lung bases, likely scarring although acute infiltrates cannot be completely excluded. Heart is normal size. No effusions. IMPRESSION: COPD/chronic changes. Increased markings in the lung bases could reflect scarring although superimposed acute infiltrates cannot be completely excluded. Electronically Signed   By: Rolm Baptise M.D.   On: 01/05/2019 12:26   Marzetta Board, MD, PhD [facility]  Contact via  [email or web address]  [place] P: [phone] F: [phone]

## 2019-01-06 NOTE — Progress Notes (Signed)
Daughter [name] was on the phone with pt & didn't have any questions or concerns while pt was updating her.  IV Remdesivir given today Convalescent plasma Pt urine specimen sent to lab. VSS Pt does however desat to mid to upper 70s with ambulation Recovers within 5 min of resting. No c/o pain

## 2019-01-07 DIAGNOSIS — E039 Hypothyroidism, unspecified: Secondary | ICD-10-CM | POA: Diagnosis present

## 2019-01-07 LAB — CBC
HCT: 38.7 % — ABNORMAL LOW (ref 39.0–52.0)
Hemoglobin: 12.9 g/dL — ABNORMAL LOW (ref 13.0–17.0)
MCH: 29.7 pg (ref 26.0–34.0)
MCHC: 33.3 g/dL (ref 30.0–36.0)
MCV: 89.2 fL (ref 80.0–100.0)
Platelets: 164 10*3/uL (ref 150–400)
RBC: 4.34 MIL/uL (ref 4.22–5.81)
RDW: 14.5 % (ref 11.5–15.5)
WBC: 6.1 10*3/uL (ref 4.0–10.5)
nRBC: 0 % (ref 0.0–0.2)

## 2019-01-07 LAB — POCT I-STAT 7, (LYTES, BLD GAS, ICA,H+H)
Acid-base deficit: 4 mmol/L — ABNORMAL HIGH (ref 0.0–2.0)
Bicarbonate: 18.8 mmol/L — ABNORMAL LOW (ref 20.0–28.0)
Calcium, Ion: 1.16 mmol/L (ref 1.15–1.40)
HCT: 43 % (ref 39.0–52.0)
Hemoglobin: 14.6 g/dL (ref 13.0–17.0)
O2 Saturation: 92 %
Patient temperature: 99.6
Potassium: 3.7 mmol/L (ref 3.5–5.1)
Sodium: 130 mmol/L — ABNORMAL LOW (ref 135–145)
TCO2: 20 mmol/L — ABNORMAL LOW (ref 22–32)
pCO2 arterial: 28.8 mmHg — ABNORMAL LOW (ref 32.0–48.0)
pH, Arterial: 7.424 (ref 7.350–7.450)
pO2, Arterial: 62 mmHg — ABNORMAL LOW (ref 83.0–108.0)

## 2019-01-07 LAB — GLUCOSE, CAPILLARY
Glucose-Capillary: 100 mg/dL — ABNORMAL HIGH (ref 70–99)
Glucose-Capillary: 91 mg/dL (ref 70–99)
Glucose-Capillary: 157 mg/dL — ABNORMAL HIGH (ref 70–99)

## 2019-01-07 LAB — COMPREHENSIVE METABOLIC PANEL
ALT: 71 U/L — ABNORMAL HIGH (ref 0–44)
AST: 82 U/L — ABNORMAL HIGH (ref 15–41)
Albumin: 2.9 g/dL — ABNORMAL LOW (ref 3.5–5.0)
Alkaline Phosphatase: 62 U/L (ref 38–126)
Anion gap: 9 (ref 5–15)
BUN: 21 mg/dL (ref 8–23)
CO2: 20 mmol/L — ABNORMAL LOW (ref 22–32)
Calcium: 8.2 mg/dL — ABNORMAL LOW (ref 8.9–10.3)
Chloride: 102 mmol/L (ref 98–111)
Creatinine, Ser: 1.12 mg/dL (ref 0.61–1.24)
GFR calc Af Amer: >60 mL/min (ref >60)
GFR calc non Af Amer: >60 mL/min (ref >60)
Glucose, Bld: 143 mg/dL — ABNORMAL HIGH (ref 70–99)
Potassium: 3.9 mmol/L (ref 3.5–5.1)
Sodium: 131 mmol/L — ABNORMAL LOW (ref 135–145)
Total Bilirubin: 0.7 mg/dL (ref 0.3–1.2)
Total Protein: 7 g/dL (ref 6.5–8.1)

## 2019-01-07 LAB — PREPARE FRESH FROZEN PLASMA

## 2019-01-07 LAB — BPAM FFP
Blood Product Expiration Date: 202010251514
ISSUE DATE / TIME: 202010241719
Unit Type and Rh: 6200

## 2019-01-07 LAB — ABO/RH: ABO/RH(D): A POS

## 2019-01-07 LAB — C-REACTIVE PROTEIN: CRP: 15.2 mg/dL — ABNORMAL HIGH (ref <1.0)

## 2019-01-07 LAB — PROCALCITONIN: Procalcitonin: 1.24 ng/mL

## 2019-01-07 MED ORDER — ATORVASTATIN CALCIUM 40 MG PO TABS
40.0000 mg | ORAL_TABLET | Freq: Every day | ORAL | Status: DC
  Administered 2019-01-07 – 2019-01-11 (×5): 40 mg via ORAL
  Filled 2019-01-07 (×5): qty 1

## 2019-01-07 MED ORDER — TOCILIZUMAB 400 MG/20ML IV SOLN
800.0000 mg | Freq: Once | INTRAVENOUS | Status: AC
  Administered 2019-01-07: 800 mg via INTRAVENOUS
  Filled 2019-01-07: qty 40

## 2019-01-07 MED ORDER — ALBUTEROL SULFATE HFA 108 (90 BASE) MCG/ACT IN AERS
1.0000 | INHALATION_SPRAY | Freq: Four times a day (QID) | RESPIRATORY_TRACT | Status: DC | PRN
  Filled 2019-01-07: qty 6.7

## 2019-01-07 NOTE — Progress Notes (Signed)
Late Entry 01/06/19 2045 Upon arrival of ongoing shift, RN was told that convalescent plasma was infusing and the 20mins post start of infusion had been done so the next vitals would be upon completion. FFP completed at 2045, line flushed and vitals taken. Vitals revealed a temp 102.55f. Patient denies any pain, chills, increased sob, or new complaints. MD and CN notified. Because upon review of documentation a temp was missing from vitals taken upon start and post 15 mins (not noted by nurse until post completion vitals taken), it is unknown if the patient was already febrile or if it is transfusion related. Tylenol was given. Temp went down to 98.5. MD never responded. Advised by CN that we can not say it is reaction related due to improper documentation by nurse who started transfusion. Patient monitored and no other abnormal response noted.

## 2019-01-07 NOTE — Progress Notes (Addendum)
PROGRESS NOTE  Larry Richmond [id number] DOB: 14-Feb-1951 DOA: 01/05/2019 PCP: Default, Provider, MD  HPI/Recap of past 15 hours: 68 year old male with past medical history of hypertension, morbid obesity, status post thyroidectomy for goiter and prediabetic admitted on 10/23 after he presented with shortness of breath x1 day plus abdominal pain/nausea and diarrhea x5 days.  CT scan of his abdomen on 10/20 in the emergency room was unremarkable and patient was sent home advising to stop NSAIDs and start taking PPI.  However by 10/22, he start developing shortness of breath and a fever.  Patient was tested for Covid at the [place] on 10/22, but results were not back by time he presented to the emergency room on 10/23.  Emergency room test was positive and patient was admitted and transferred over to [facility].  Patient noted to be hypoxic and started on 4 L nasal cannula.  He was started on Remdisivir and Decadron.  Initially felt a little bit better by 10/24.  Overnight, patient started feeling worse, increased oxygen needs, currently at 6 L high flow nasal cannula.  Patient feels like his breathing is rough today, hurts when he takes a deep inspiration across his chest.  Less abdominal pain and no diarrhea.  He feels fatigued.  No headache.  Actemra ordered.    Assessment/Plan: Active Problems: Acute respiratory failure with hypoxia secondary to COVID-19 virus infection: Patient's hypoxia getting worse.  Continue steroids and Remdisivir.  Have added 1 dose of Actemra.  We will continue to monitor and if he worsens, we will plan to transfer to ICU and give convalescent plasma.  CRP notes improvement.  Check procalcitonin level and follow-up ABG  Prediabetic: A1c at 6.0.  Monitor sugars while on steroids.  Morbid obesity: Patient meets criteria for BMI greater than 40.  Status post thyroidectomy: Continue Synthroid   Code Status: Full code  Family Communication: Left message for  daughter  Disposition Plan: Clinical situation getting worse.  Potential transfer to ICU later today   Consultants:  Discussed with critical care  Procedures:  None  Antimicrobials:  Actemra x1 dose 10/25  Remdisivir 10/24-present  DVT prophylaxis: Lovenox   Objective: Vitals:   01/07/19 0749 01/07/19 1138  BP: (!) 168/87 (!) 153/86  Pulse: (!) 58 67  Resp: 18 20  Temp: 98 F (36.7 C) 99.6 F (37.6 C)  SpO2: 97% 95%    Intake/Output Summary (Last 24 hours) at 01/07/2019 1243 Last data filed at 01/07/2019 0900 Gross per 24 hour  Intake 725 ml  Output -  Net 725 ml   Filed Weights   01/05/19 1145 01/05/19 2208  Weight: (!) 149.7 kg (!) 151.3 kg   Body mass index is 42.83 kg/m.  Exam:   General: Alert and oriented x3, mild distress secondary to shortness of breath  HEENT: Normocephalic atraumatic, mucous membranes are moist  Neck: Narrow airway, thick  Cardiovascular: Regular rate and rhythm, S1-S2  Respiratory: Limited inspiratory effort, otherwise clear  Abdomen: Soft, nontender, nondistended, positive bowel sounds  Musculoskeletal: No clubbing or cyanosis, trace pitting edema  Skin: No skin breaks, tears or lesions  Neuro: No focal deficits  Psychiatry: Patient is appropriate, no evidence of psychosis   Data Reviewed: CBC: Recent Labs  Lab 01/02/19 1057 01/05/19 1146 01/05/19 2101 01/06/19 0709 01/07/19 0106  WBC 5.3 8.6 7.3 7.9 6.1  NEUTROABS  --  7.8*  --   --   --   HGB 14.7 14.5 13.9 12.9* 12.9*  HCT 46.9  42.9 42.8 39.4 38.7*  MCV 94.9 90.5 92.6 90.0 89.2  PLT 141* 145* 149* 152 [id number]   Basic Metabolic Panel: Recent Labs  Lab 01/02/19 1057 01/05/19 1146 01/05/19 2101 01/06/19 0709 01/07/19 0106  NA 130* 128*  --  129* 131*  K 3.9 4.0  --  3.9 3.9  CL 102 97*  --  97* 102  CO2 17* 20*  --  21* 20*  GLUCOSE 109* 125*  --  123* 143*  BUN 14 16  --  18 21  CREATININE 1.27* 1.41* 1.29* 1.06 1.12  CALCIUM 8.4* 8.2*   --  7.9* 8.2*   GFR: Estimated Creatinine Clearance: 95.3 mL/min (by C-G formula based on SCr of 1.12 mg/dL). Liver Function Tests: Recent Labs  Lab 01/02/19 1057 01/05/19 1146 01/06/19 0709 01/07/19 0106  AST 28 126* 107* 82*  ALT 20 75* 74* 71*  ALKPHOS 47 77 62 62  BILITOT 1.0 1.0 1.0 0.7  PROT 8.0 6.9 7.4 7.0  ALBUMIN 3.7 3.0* 3.0* 2.9*   Recent Labs  Lab 01/02/19 1057  LIPASE 19   No results for input(s): AMMONIA in the last 168 hours. Coagulation Profile: No results for input(s): INR, PROTIME in the last 168 hours. Cardiac Enzymes: No results for input(s): CKTOTAL, CKMB, CKMBINDEX, TROPONINI in the last 168 hours. BNP (last 3 results) No results for input(s): PROBNP in the last 8760 hours. HbA1C: Recent Labs    01/05/19 2101  HGBA1C 6.0*   CBG: Recent Labs  Lab 01/05/19 2211 01/06/19 0815 01/06/19 1153 01/06/19 2048 01/07/19 0748  GLUCAP 119* 102* 125* 105* 100*   Lipid Profile: Recent Labs    01/05/19 1146  TRIG 105   Thyroid Function Tests: Recent Labs    01/05/19 2101  TSH 0.512   Anemia Panel: Recent Labs    01/05/19 1146  FERRITIN 2,318*   Urine analysis:    Component Value Date/Time   COLORURINE AMBER (A) 01/02/2019 1252   APPEARANCEUR HAZY (A) 01/02/2019 1252   LABSPEC 1.025 01/02/2019 1252   PHURINE 5.0 01/02/2019 1252   GLUCOSEU NEGATIVE 01/02/2019 1252   [place] 01/02/2019 1252   [place] 01/02/2019 1252   KETONESUR 5 (A) 01/02/2019 1252   PROTEINUR 30 (A) 01/02/2019 1252   NITRITE NEGATIVE 01/02/2019 1252   LEUKOCYTESUR NEGATIVE 01/02/2019 1252   Sepsis Labs: @LABRCNTIP (procalcitonin:4,lacticidven:4)  ) Recent Results (from the past 240 hour(s))  SARS Coronavirus 2 by RT PCR (hospital order, performed in [facility] hospital lab) Nasopharyngeal Nasopharyngeal Swab     Status: Abnormal   Collection Time: 01/05/19 11:46 AM   Specimen: Nasopharyngeal Swab  Result Value Ref Range Status   SARS  Coronavirus 2 POSITIVE (A) NEGATIVE Final    Comment: RESULT CALLED TO, READ BACK BY AND VERIFIED WITH: Bethena Midget RN 14:10 01/05/19 ([email or web address]) (NOTE) If result is NEGATIVE SARS-CoV-2 target nucleic acids are NOT DETECTED. The SARS-CoV-2 RNA is generally detectable in upper and lower  respiratory specimens during the acute phase of infection. The lowest  concentration of SARS-CoV-2 viral copies this assay can detect is 250  copies / mL. A negative result does not preclude SARS-CoV-2 infection  and should not be used as the sole basis for treatment or other  patient management decisions.  A negative result may occur with  improper specimen collection / handling, submission of specimen other  than nasopharyngeal swab, presence of viral mutation(s) within the  areas targeted by this assay, and inadequate number of viral copies  (<250  copies / mL). A negative result must be combined with clinical  observations, patient history, and epidemiological information. If result is POSITIVE SARS-CoV-2 target nucleic acids are DETECTED.  The SARS-CoV-2 RNA is generally detectable in upper and lower  respiratory specimens during the acute phase of infection.  Positive  results are indicative of active infection with SARS-CoV-2.  Clinical  correlation with patient history and other diagnostic information is  necessary to determine patient infection status.  Positive results do  not rule out bacterial infection or co-infection with other viruses. If result is PRESUMPTIVE POSTIVE SARS-CoV-2 nucleic acids MAY BE PRESENT.   A presumptive positive result was obtained on the submitted specimen  and confirmed on repeat testing.  While 2019 novel coronavirus  (SARS-CoV-2) nucleic acids may be present in the submitted sample  additional confirmatory testing may be necessary for epidemiological  and / or clinical management purposes  to differentiate between  SARS-CoV-2 and other Sarbecovirus currently known to  infect humans.  If clinically indicated additional testing with an alternate test  methodology [phone]) i s advised. The SARS-CoV-2 RNA is generally  detectable in upper and lower respiratory specimens during the acute  phase of infection. The expected result is Negative. Fact Sheet for Patients:  StrictlyIdeas.no Fact Sheet for Healthcare Providers: BankingDealers.co.za This test is not yet approved or cleared by the [place] FDA and has been authorized for detection and/or diagnosis of SARS-CoV-2 by FDA under an Emergency Use Authorization (EUA).  This EUA will remain in effect (meaning this test can be used) for the duration of the COVID-19 declaration under Section 564(b)(1) of the Act, 21 U.S.C. section 360bbb-3(b)(1), unless the authorization is terminated or revoked sooner. Performed at [facility], [place] [address], [place], [place] [address]   Blood Culture (routine x 2)     Status: None (Preliminary result)   Collection Time: 01/05/19  1:00 PM   Specimen: BLOOD  Result Value Ref Range Status   Specimen Description BLOOD SITE NOT SPECIFIED  Final   Special Requests   Final    BOTTLES DRAWN AEROBIC AND ANAEROBIC Blood Culture adequate volume   Culture   Final    NO GROWTH < 24 HOURS Performed at [facility], [place] [address] [place], [place] [address]    Report Status PENDING  Incomplete  Blood Culture (routine x 2)     Status: None (Preliminary result)   Collection Time: 01/05/19  1:00 PM   Specimen: BLOOD  Result Value Ref Range Status   Specimen Description BLOOD SITE NOT SPECIFIED  Final   Special Requests   Final    BOTTLES DRAWN AEROBIC ONLY Blood Culture results may not be optimal due to an inadequate volume of blood received in culture bottles   Culture   Final    NO GROWTH < 24 HOURS Performed at [facility], [place] [address] [place], [place] [address]    Report Status PENDING  Incomplete       Studies: No results found.  Scheduled Meds: . dexamethasone  6 mg Oral Q24H  . enoxaparin (LOVENOX) injection  75 mg Subcutaneous Q24H  . feeding supplement (ENSURE ENLIVE)  237 mL Oral BID BM  . influenza vaccine adjuvanted  0.5 mL Intramuscular Tomorrow-1000  . insulin aspart  0-9 Units Subcutaneous TID WC  . levothyroxine  100 mcg Oral Q0600  . pantoprazole  40 mg Oral QPM  . pneumococcal 23 valent vaccine  0.5 mL Intramuscular Tomorrow-1000  .  sodium chloride flush  3 mL Intravenous Q12H    Continuous Infusions: . remdesivir 100 mg in NS 250 mL 100 mg (01/06/19 1650)  . tocilizumab (ACTEMRA) IV       LOS: 2 days     Annita Brod, MD [facility]  To reach me or the doctor on call, go to: [email or web address] Password [facility]  01/07/2019, 12:43 PM

## 2019-01-07 NOTE — Progress Notes (Addendum)
Initial Nutrition Assessment  DOCUMENTATION CODES:   Morbid obesity  INTERVENTION:   -Ensure Enlive po BID, each supplement provides 350 kcal and 20 grams of protein  -Hormel shake with breakfast, provides 520 kcal and 22g protein. -Prostat liquid protein PO 30 ml BID with meals, each supplement provides 100 kcal, 15 grams protein.  NUTRITION DIAGNOSIS:   Increased nutrient needs related to acute illness as evidenced by estimated needs.  GOAL:   Patient will meet greater than or equal to 90% of their needs  MONITOR:   PO intake, Supplement acceptance, Labs, Weight trends, I & O's  REASON FOR ASSESSMENT:   Malnutrition Screening Tool    ASSESSMENT:   68 year old male with past medical history of hypertension, morbid obesity, status post thyroidectomy for goiter and prediabetic admitted on 10/23 after he presented with shortness of breath x1 day plus abdominal pain/nausea and diarrhea x5 days.  **RD working remotely**  Patient currently consuming 100% of meals at this time. Pt was having abdominal pain and nausea PTA but was still able to consume meals.  Ensure supplements have been ordered for patient and pt is drinking them at this time.  Per weight records, pt's weight has increased since 2017/2018. No weight loss noted.  I/Os: +1470 ml since admit UOP 300 ml x 24 hrs Medications reviewed. Labs reviewed:  CBGs: 100 Low Na  NUTRITION - FOCUSED PHYSICAL EXAM:  Unable to perform -working remotely.  Diet Order:   Diet Order            Diet regular Room service appropriate? Yes; Fluid consistency: Thin  Diet effective now              EDUCATION NEEDS:   No education needs have been identified at this time  Skin:  Skin Assessment: Reviewed RN Assessment  Last BM:  10/24  Height:   Ht Readings from Last 1 Encounters:  01/05/19 6\' 2"  (1.88 m)    Weight:   Wt Readings from Last 1 Encounters:  01/05/19 (!) 151.3 kg    Ideal Body Weight:  86.3  kg  BMI:  Body mass index is 42.83 kg/m.  Estimated Nutritional Needs:   Kcal:  [id number]  Protein:  120-130g  Fluid:  2L/day  Clayton Bibles, MS, RD, LDN Inpatient Clinical Dietitian Pager: [phone] After Hours Pager: [phone]

## 2019-01-07 NOTE — Progress Notes (Signed)
Worsening SOB especially with excertion-desats to mid-upper 70's. Pt is now encouraged to use urinal at [facility] for now & to use IS more frequently, PRONE while tolerable.  Had a conversation with Dr [place] Pink about his condition (yellow MEWS) *STAT ABG results were reassuring. *IV Remdesivir cont.. *PO decadron cont *IV tocilizumab, PO zinc & PO vitc started (convalescent plasma 10/24)   Pt has required 6-8L HFNC while at rest (encouraged rest for now)  11-14L HFNC w/ ambulation but still desats significantly  Plan: Continue meds as ordered Prone as he can tolerate Use IS Use urinal at this time until oxygenation improves  SQ lovenox for DVT prophylaxis  Wife updated twice today by me. MD spoke w/ daughter, [name]. Pt had belongings dropped off today-clothes & phone charger.

## 2019-01-08 LAB — C-REACTIVE PROTEIN: CRP: 9.5 mg/dL — ABNORMAL HIGH (ref <1.0)

## 2019-01-08 LAB — GLUCOSE, CAPILLARY
Glucose-Capillary: 99 mg/dL (ref 70–99)
Glucose-Capillary: 102 mg/dL — ABNORMAL HIGH (ref 70–99)
Glucose-Capillary: 172 mg/dL — ABNORMAL HIGH (ref 70–99)
Glucose-Capillary: 114 mg/dL — ABNORMAL HIGH (ref 70–99)
Glucose-Capillary: 129 mg/dL — ABNORMAL HIGH (ref 70–99)

## 2019-01-08 LAB — COMPREHENSIVE METABOLIC PANEL
ALT: 52 U/L — ABNORMAL HIGH (ref 0–44)
AST: 46 U/L — ABNORMAL HIGH (ref 15–41)
Albumin: 2.6 g/dL — ABNORMAL LOW (ref 3.5–5.0)
Alkaline Phosphatase: 55 U/L (ref 38–126)
Anion gap: 7 (ref 5–15)
BUN: 22 mg/dL (ref 8–23)
CO2: 26 mmol/L (ref 22–32)
Calcium: 8.1 mg/dL — ABNORMAL LOW (ref 8.9–10.3)
Chloride: 100 mmol/L (ref 98–111)
Creatinine, Ser: 1.14 mg/dL (ref 0.61–1.24)
GFR calc Af Amer: >60 mL/min (ref >60)
GFR calc non Af Amer: >60 mL/min (ref >60)
Glucose, Bld: 121 mg/dL — ABNORMAL HIGH (ref 70–99)
Potassium: 4.2 mmol/L (ref 3.5–5.1)
Sodium: 133 mmol/L — ABNORMAL LOW (ref 135–145)
Total Bilirubin: 0.5 mg/dL (ref 0.3–1.2)
Total Protein: 6.7 g/dL (ref 6.5–8.1)

## 2019-01-08 LAB — CBC
HCT: 40.2 % (ref 39.0–52.0)
Hemoglobin: 13.4 g/dL (ref 13.0–17.0)
MCH: 30.2 pg (ref 26.0–34.0)
MCHC: 33.3 g/dL (ref 30.0–36.0)
MCV: 90.7 fL (ref 80.0–100.0)
Platelets: 179 10*3/uL (ref 150–400)
RBC: 4.43 MIL/uL (ref 4.22–5.81)
RDW: 14.7 % (ref 11.5–15.5)
WBC: 3.2 10*3/uL — ABNORMAL LOW (ref 4.0–10.5)
nRBC: 0 % (ref 0.0–0.2)

## 2019-01-08 LAB — HEPATITIS B SURFACE ANTIBODY, QUANTITATIVE: Hep B S AB Quant (Post): <3.1 m[IU]/mL — ABNORMAL LOW (ref >9.9)

## 2019-01-08 LAB — FERRITIN: Ferritin: 1656 ng/mL — ABNORMAL HIGH (ref 24–336)

## 2019-01-08 LAB — D-DIMER, QUANTITATIVE: D-Dimer, Quant: 0.75 ug/mL-FEU — ABNORMAL HIGH (ref 0.00–0.50)

## 2019-01-08 MED ORDER — SODIUM CHLORIDE 0.9 % IV SOLN
2.0000 g | Freq: Three times a day (TID) | INTRAVENOUS | Status: DC
  Administered 2019-01-08 – 2019-01-11 (×10): 2 g via INTRAVENOUS
  Filled 2019-01-08 (×12): qty 2

## 2019-01-08 NOTE — Progress Notes (Signed)
Pt wife Jinny Sanders called late am & I updated her on pt.  IV Remdesivir continued PO decadron SQ lovenox Pt did well with IS use (1750) Slept overnight in prone position Ate at least half of all meals. **Pt has been up in chair sitting since 1300-requires 8-9L while in chair (90-93%) 7L in bed  IV cefepime added    Stayed in pt room while he ate lunch, he tends to desat while eating or having conversations. Will continue to monitor.  Pt states "feeling much better than yesterday". Oxygenation needs improvement.  Hourly safety checks complete.

## 2019-01-08 NOTE — Progress Notes (Signed)
PROGRESS NOTE  Larry Richmond [id number] DOB: 24-Apr-1950 DOA: 01/05/2019 PCP: Default, Provider, MD  HPI/Recap of past 57 hours: 68 year old male with past medical history of hypertension, morbid obesity, status post thyroidectomy for goiter and prediabetic admitted on 10/23 after he presented with shortness of breath x1 day plus abdominal pain/nausea and diarrhea x5 days.  CT scan of his abdomen on 10/20 in the emergency room was unremarkable and patient was sent home advising to stop NSAIDs and start taking PPI.  However by 10/22, he start developing shortness of breath and a fever.  Patient was tested for Covid at the [place] on 10/22, but results were not back by time he presented to the emergency room on 10/23.  Emergency room test was positive and patient was admitted and transferred over to [facility].  Patient noted to be hypoxic and started on 4 L nasal cannula.  He was started on Remdisivir and Decadron.  Initially felt a little bit better by 10/24.  That night, patient started feeling worse, with increased oxygen needs, and by 10/25, requiring 6 L high flow nasal cannula.  He was given a dose of Actemra.  Overnight, patient's oxygen increased to 7 L high flow.  However, today, patient feeling much better.  He feels like a different person.  Breathing more comfortable and vitals stable.    Assessment/Plan: Active Problems: Acute respiratory failure with hypoxia secondary to COVID-19 virus infection: On steroids and Remdisivir and has received 1 dose of Actemra.  Appears to be stabilizing.  Continue steroids and Remdisivir.  Given elevated procalcitonin, will start antibiotics.  Prediabetic: A1c at 6.0.  Monitor sugars while on steroids.  Morbid obesity: Patient meets criteria for BMI greater than 40.  Status post thyroidectomy: Continue Synthroid   Code Status: Full code  Family Communication: Discussed with daughter by phone while at the bedside  Disposition Plan:  Stabilizing.  Hopefully we will try to wean down oxygen   Consultants:  Discussed with critical care  Procedures:  None  Antimicrobials:  Actemra x1 dose 10/25  Remdisivir 10/24-present  IV cefepime 10/26-present  DVT prophylaxis: Lovenox   Objective: Vitals:   01/08/19 0900 01/08/19 1247  BP:    Pulse: 63   Resp:  (!) 26  Temp:    SpO2: 94% (!) 89%    Intake/Output Summary (Last 24 hours) at 01/08/2019 1327 Last data filed at 01/08/2019 0859 Gross per 24 hour  Intake 860 ml  Output 1600 ml  Net -740 ml   Filed Weights   01/05/19 1145 01/05/19 2208  Weight: (!) 149.7 kg (!) 151.3 kg   Body mass index is 42.83 kg/m.  Exam:   General: Alert and oriented x3, no acute distress  HEENT: Normocephalic atraumatic, mucous membranes are moist  Neck: Narrow airway, thick  Cardiovascular: Regular rate and rhythm, S1-S2  Respiratory: Clear to auscultation bilaterally  Abdomen: Soft, nontender, nondistended, positive bowel sounds  Musculoskeletal: No clubbing or cyanosis, trace pitting edema  Skin: No skin breaks, tears or lesions  Neuro: No focal deficits  Psychiatry: Patient is appropriate, no evidence of psychosis   Data Reviewed: CBC: Recent Labs  Lab 01/05/19 1146 01/05/19 2101 01/06/19 0709 01/07/19 0106 01/07/19 1325 01/08/19 0720  WBC 8.6 7.3 7.9 6.1  --  3.2*  NEUTROABS 7.8*  --   --   --   --   --   HGB 14.5 13.9 12.9* 12.9* 14.6 13.4  HCT 42.9 42.8 39.4 38.7* 43.0 40.2  MCV  90.5 92.6 90.0 89.2  --  90.7  PLT 145* 149* 152 164  --  0000000   Basic Metabolic Panel: Recent Labs  Lab 01/02/19 1057 01/05/19 1146 01/05/19 2101 01/06/19 0709 01/07/19 0106 01/07/19 1325 01/08/19 0720  NA 130* 128*  --  129* 131* 130* 133*  K 3.9 4.0  --  3.9 3.9 3.7 4.2  CL 102 97*  --  97* 102  --  100  CO2 17* 20*  --  21* 20*  --  26  GLUCOSE 109* 125*  --  123* 143*  --  121*  BUN 14 16  --  18 21  --  22  CREATININE 1.27* 1.41* 1.29* 1.06  1.12  --  1.14  CALCIUM 8.4* 8.2*  --  7.9* 8.2*  --  8.1*   GFR: Estimated Creatinine Clearance: 93.6 mL/min (by C-G formula based on SCr of 1.14 mg/dL). Liver Function Tests: Recent Labs  Lab 01/02/19 1057 01/05/19 1146 01/06/19 0709 01/07/19 0106 01/08/19 0720  AST 28 126* 107* 82* 46*  ALT 20 75* 74* 71* 52*  ALKPHOS 47 77 62 62 55  BILITOT 1.0 1.0 1.0 0.7 0.5  PROT 8.0 6.9 7.4 7.0 6.7  ALBUMIN 3.7 3.0* 3.0* 2.9* 2.6*   Recent Labs  Lab 01/02/19 1057  LIPASE 19   No results for input(s): AMMONIA in the last 168 hours. Coagulation Profile: No results for input(s): INR, PROTIME in the last 168 hours. Cardiac Enzymes: No results for input(s): CKTOTAL, CKMB, CKMBINDEX, TROPONINI in the last 168 hours. BNP (last 3 results) No results for input(s): PROBNP in the last 8760 hours. HbA1C: Recent Labs    01/05/19 2101  HGBA1C 6.0*   CBG: Recent Labs  Lab 01/07/19 0748 01/07/19 1605 01/07/19 2100 01/08/19 0802 01/08/19 1115  GLUCAP 100* 91 157* 102* 172*   Lipid Profile: No results for input(s): CHOL, HDL, LDLCALC, TRIG, CHOLHDL, LDLDIRECT in the last 72 hours. Thyroid Function Tests: Recent Labs    01/05/19 2101  TSH 0.512   Anemia Panel: Recent Labs    01/08/19 0720  FERRITIN 1,656*   Urine analysis:    Component Value Date/Time   COLORURINE AMBER (A) 01/02/2019 1252   APPEARANCEUR HAZY (A) 01/02/2019 1252   LABSPEC 1.025 01/02/2019 1252   PHURINE 5.0 01/02/2019 1252   GLUCOSEU NEGATIVE 01/02/2019 1252   HGBUR NEGATIVE 01/02/2019 1252   [place]in 01/02/2019 1252   KETONESUR 5 (A) 01/02/2019 1252   PROTEINUR 30 (A) 01/02/2019 1252   NITRITE NEGATIVE 01/02/2019 1252   LEUKOCYTESUR NEGATIVE 01/02/2019 1252   Sepsis Labs: @LABRCNTIP (procalcitonin:4,lacticidven:4)  ) Recent Results (from the past 240 hour(s))  SARS Coronavirus 2 by RT PCR (hospital order, performed in [place] hospital lab) Nasopharyngeal Nasopharyngeal Swab      Status: Abnormal   Collection Time: 01/05/19 11:46 AM   Specimen: Nasopharyngeal Swab  Result Value Ref Range Status   SARS Coronavirus 2 POSITIVE (A) NEGATIVE Final    Comment: RESULT CALLED TO, READ BACK BY AND VERIFIED WITH: Bethena Midget RN 14:10 01/05/19 ([email or web address]) (NOTE) If result is NEGATIVE SARS-CoV-2 target nucleic acids are NOT DETECTED. The SARS-CoV-2 RNA is generally detectable in upper and lower  respiratory specimens during the acute phase of infection. The lowest  concentration of SARS-CoV-2 viral copies this assay can detect is 250  copies / mL. A negative result does not preclude SARS-CoV-2 infection  and should not be used as the sole basis for treatment or other  patient  management decisions.  A negative result may occur with  improper specimen collection / handling, submission of specimen other  than nasopharyngeal swab, presence of viral mutation(s) within the  areas targeted by this assay, and inadequate number of viral copies  (<250 copies / mL). A negative result must be combined with clinical  observations, patient history, and epidemiological information. If result is POSITIVE SARS-CoV-2 target nucleic acids are DETECTED.  The SARS-CoV-2 RNA is generally detectable in upper and lower  respiratory specimens during the acute phase of infection.  Positive  results are indicative of active infection with SARS-CoV-2.  Clinical  correlation with patient history and other diagnostic information is  necessary to determine patient infection status.  Positive results do  not rule out bacterial infection or co-infection with other viruses. If result is PRESUMPTIVE POSTIVE SARS-CoV-2 nucleic acids MAY BE PRESENT.   A presumptive positive result was obtained on the submitted specimen  and confirmed on repeat testing.  While 2019 novel coronavirus  (SARS-CoV-2) nucleic acids may be present in the submitted sample  additional confirmatory testing may be necessary for  epidemiological  and / or clinical management purposes  to differentiate between  SARS-CoV-2 and other Sarbecovirus currently known to infect humans.  If clinically indicated additional testing with an alternate test  methodology [phone]) i s advised. The SARS-CoV-2 RNA is generally  detectable in upper and lower respiratory specimens during the acute  phase of infection. The expected result is Negative. Fact Sheet for Patients:  StrictlyIdeas.no Fact Sheet for Healthcare Providers: BankingDealers.co.za This test is not yet approved or cleared by the [place] FDA and has been authorized for detection and/or diagnosis of SARS-CoV-2 by FDA under an Emergency Use Authorization (EUA).  This EUA will remain in effect (meaning this test can be used) for the duration of the COVID-19 declaration under Section 564(b)(1) of the Act, 21 U.S.C. section 360bbb-3(b)(1), unless the authorization is terminated or revoked sooner. Performed at [facility], [place] [address], [place], [place] [address]   Blood Culture (routine x 2)     Status: None (Preliminary result)   Collection Time: 01/05/19  1:00 PM   Specimen: BLOOD  Result Value Ref Range Status   Specimen Description BLOOD SITE NOT SPECIFIED  Final   Special Requests   Final    BOTTLES DRAWN AEROBIC AND ANAEROBIC Blood Culture adequate volume   Culture   Final    NO GROWTH 3 DAYS Performed at [facility], [address] [place], [place] [address]    Report Status PENDING  Incomplete  Blood Culture (routine x 2)     Status: None (Preliminary result)   Collection Time: 01/05/19  1:00 PM   Specimen: BLOOD  Result Value Ref Range Status   Specimen Description BLOOD SITE NOT SPECIFIED  Final   Special Requests   Final    BOTTLES DRAWN AEROBIC ONLY Blood Culture results may not be optimal due to an inadequate volume of blood received in culture bottles   Culture   Final    NO  GROWTH 3 DAYS Performed at [facility], [place] [address] [place],  [address]    Report Status PENDING  Incomplete      Studies: No results found.  Scheduled Meds: . atorvastatin  40 mg Oral Daily  . dexamethasone  6 mg Oral Q24H  . enoxaparin (LOVENOX) injection  75 mg Subcutaneous Q24H  . feeding supplement (ENSURE ENLIVE)  237 mL Oral BID  BM  . influenza vaccine adjuvanted  0.5 mL Intramuscular Tomorrow-1000  . insulin aspart  0-9 Units Subcutaneous TID WC  . levothyroxine  100 mcg Oral Q0600  . pantoprazole  40 mg Oral QPM  . pneumococcal 23 valent vaccine  0.5 mL Intramuscular Tomorrow-1000  . sodium chloride flush  3 mL Intravenous Q12H    Continuous Infusions: . remdesivir 100 mg in NS 250 mL 100 mg (01/07/19 1630)     LOS: 3 days     Annita Brod, MD [facility]  To reach me or the doctor on call, go to: [email or web address] Password [email or web address]  01/08/2019, 1:27 PM

## 2019-01-08 NOTE — Progress Notes (Signed)
Pharmacy Antibiotic Note  Larry Richmond is a 68 y.o. male admitted on 01/05/2019 with COVID-19 now with concern for superimposed bacterial pneumonia.  Pharmacy has been consulted for Cefepime dosing. WBC down to 3.2. Afebrile. Worsening oxygen requirements. SCr wnl. PCT 1.24   Plan: -Start Cefepime 2 gm IV Q 8 hours  -Monitor renal fx, cultures and clinical progress   Height: 6\' 2"  (188 cm) Weight: (!) 333 lb 8.9 oz (151.3 kg) IBW/kg (Calculated) : 82.2  Temp (24hrs), Avg:98 F (36.7 C), Min:97.3 F (36.3 C), Max:98.5 F (36.9 C)  Recent Labs  Lab 01/05/19 1146 01/05/19 1200 01/05/19 2101 01/06/19 0709 01/07/19 0106 01/08/19 0720  WBC 8.6  --  7.3 7.9 6.1 3.2*  CREATININE 1.41*  --  1.29* 1.06 1.12 1.14  LATICACIDVEN  --  2.5* 1.4  --   --   --     Estimated Creatinine Clearance: 93.6 mL/min (by C-G formula based on SCr of 1.14 mg/dL).    No Known Allergies  Antimicrobials this admission: Cefepime 10/26 >>    Microbiology results: 10/23 BCx: ngtd   Thank you for allowing pharmacy to be a part of this patient's care.  Albertina Parr, PharmD., BCPS Clinical Pharmacist Clinical phone for 01/08/19 until 5pm: [phone]

## 2019-01-09 LAB — CBC
HCT: 44.2 % (ref 39.0–52.0)
Hemoglobin: 14 g/dL (ref 13.0–17.0)
MCH: 29.4 pg (ref 26.0–34.0)
MCHC: 31.7 g/dL (ref 30.0–36.0)
MCV: 92.9 fL (ref 80.0–100.0)
Platelets: 174 10*3/uL (ref 150–400)
RBC: 4.76 MIL/uL (ref 4.22–5.81)
RDW: 14.9 % (ref 11.5–15.5)
WBC: 3.9 10*3/uL — ABNORMAL LOW (ref 4.0–10.5)
nRBC: 0 % (ref 0.0–0.2)

## 2019-01-09 LAB — GLUCOSE, CAPILLARY
Glucose-Capillary: 112 mg/dL — ABNORMAL HIGH (ref 70–99)
Glucose-Capillary: 108 mg/dL — ABNORMAL HIGH (ref 70–99)
Glucose-Capillary: 102 mg/dL — ABNORMAL HIGH (ref 70–99)
Glucose-Capillary: 117 mg/dL — ABNORMAL HIGH (ref 70–99)

## 2019-01-09 LAB — COMPREHENSIVE METABOLIC PANEL
ALT: 49 U/L — ABNORMAL HIGH (ref 0–44)
AST: 43 U/L — ABNORMAL HIGH (ref 15–41)
Albumin: 2.8 g/dL — ABNORMAL LOW (ref 3.5–5.0)
Alkaline Phosphatase: 56 U/L (ref 38–126)
Anion gap: 8 (ref 5–15)
BUN: 27 mg/dL — ABNORMAL HIGH (ref 8–23)
CO2: 25 mmol/L (ref 22–32)
Calcium: 8.3 mg/dL — ABNORMAL LOW (ref 8.9–10.3)
Chloride: 101 mmol/L (ref 98–111)
Creatinine, Ser: 1.07 mg/dL (ref 0.61–1.24)
GFR calc Af Amer: >60 mL/min (ref >60)
GFR calc non Af Amer: >60 mL/min (ref >60)
Glucose, Bld: 126 mg/dL — ABNORMAL HIGH (ref 70–99)
Potassium: 4.1 mmol/L (ref 3.5–5.1)
Sodium: 134 mmol/L — ABNORMAL LOW (ref 135–145)
Total Bilirubin: 0.3 mg/dL (ref 0.3–1.2)
Total Protein: 6.8 g/dL (ref 6.5–8.1)

## 2019-01-09 LAB — FERRITIN: Ferritin: 1296 ng/mL — ABNORMAL HIGH (ref 24–336)

## 2019-01-09 LAB — C-REACTIVE PROTEIN: CRP: 6.6 mg/dL — ABNORMAL HIGH (ref <1.0)

## 2019-01-09 LAB — PROCALCITONIN: Procalcitonin: 0.37 ng/mL

## 2019-01-09 LAB — D-DIMER, QUANTITATIVE: D-Dimer, Quant: 0.71 ug/mL-FEU — ABNORMAL HIGH (ref 0.00–0.50)

## 2019-01-09 NOTE — Progress Notes (Signed)
Pt wife Jinny Sanders called pt while I was in room & he updated her. She didn't have any questions or concerns for me today.  IV Remdesivir complete PO decadron SQ lovenox **Pt did well with IS use (1750) Slept overnight in prone position Ate at least half of all meals. **Pt has been up in chair sitting since 1300-requires 8-11L while in chair or ambulating to bathroom (90-93%) 6L at rest in bed.  Hourly safety checks complete.

## 2019-01-09 NOTE — Progress Notes (Addendum)
PROGRESS NOTE  Larry Richmond [id number] DOB: [birth year] DOA: 01/05/2019 PCP: Default, Provider, MD  HPI/Recap of past 40 hours: 68 year old male with past medical history of hypertension, morbid obesity, status post thyroidectomy for goiter and prediabetic admitted on 10/23 after he presented with shortness of breath x1 day plus abdominal pain/nausea and diarrhea x5 days.  CT scan of his abdomen on 10/20 in the emergency room was unremarkable and patient was sent home advising to stop NSAIDs and start taking PPI.  However by 10/22, he start developing shortness of breath and a fever.  Patient was tested for Covid at the [place] on 10/22, but results were not back by time he presented to the emergency room on 10/23.  Emergency room test was positive and patient was admitted and transferred over to [facility].  Patient noted to be hypoxic and started on 4 L nasal cannula.  He was started on Remdisivir and Decadron.  Initially felt a little bit better by 10/24.  That night, patient started feeling worse, with increased oxygen needs, and by 10/25, requiring 6 L high flow nasal cannula.  He was given a dose of Actemra & patient's oxygen increased to 7 L high flow.  Procalcitonin elevated, so patient started on IV cefepime on 10/26.  By morning of 10/26, patient feeling much better, breathing much more comfortably.  Today, he continues to feel good with no complaints.  Oxygen down to 6 lit.    Assessment/Plan: Active Problems: Acute respiratory failure with hypoxia secondary to COVID-19 virus infection: On steroids and Remdisivir and has received 1 dose of Actemra.  Appears to be stabilizing.  Continue steroids and Remdisivir.  Started on IV cefepime after elevated procalcitonin.  Ferritin, CRP, D-dimer and procalcitonin all slowly trending downward.  Prediabetic: A1c at 6.0.  Monitor sugars while on steroids.  Morbid obesity: Patient meets criteria for BMI greater than 40.  Status post  thyroidectomy: Continue Synthroid   Code Status: Full code  Family Communication: Updated wife by phone.  Disposition Plan: Stabilizing.  Hopefully we will try to wean down oxygen   Consultants:  Discussed with critical care  Procedures:  None  Antimicrobials:  Actemra x1 dose 10/25  Remdisivir 10/24-present  IV cefepime 10/26-present  DVT prophylaxis: Lovenox   Objective: Vitals:   01/09/19 0700 01/09/19 0803  BP: 137/70 131/78  Pulse:  (!) 54  Resp: (!) 22 20  Temp: 98.2 F (36.8 C)   SpO2:  94%    Intake/Output Summary (Last 24 hours) at 01/09/2019 1307 Last data filed at 01/09/2019 0725 Gross per 24 hour  Intake 830 ml  Output 377 ml  Net 453 ml   Filed Weights   01/05/19 1145 01/05/19 2208  Weight: (!) 149.7 kg (!) 151.3 kg   Body mass index is 42.83 kg/m.  Exam:   General: Alert and oriented x3, no acute distress  HEENT: Normocephalic atraumatic, mucous membranes are moist  Neck: Narrow airway, thick  Cardiovascular: Regular rate and rhythm, S1-S2  Respiratory: Clear to auscultation bilaterally  Abdomen: Soft, nontender, nondistended, positive bowel sounds  Musculoskeletal: No clubbing or cyanosis, no pitting edema  Skin: No skin breaks, tears or lesions  Neuro: No focal deficits  Psychiatry: Patient is appropriate, no evidence of psychosis   Data Reviewed: CBC: Recent Labs  Lab 01/05/19 1146 01/05/19 2101 01/06/19 0709 01/07/19 0106 01/07/19 1325 01/08/19 0720 01/09/19 0115  WBC 8.6 7.3 7.9 6.1  --  3.2* 3.9*  NEUTROABS 7.8*  --   --   --   --   --   --  HGB 14.5 13.9 12.9* 12.9* 14.6 13.4 14.0  HCT 42.9 42.8 39.4 38.7* 43.0 40.2 44.2  MCV 90.5 92.6 90.0 89.2  --  90.7 92.9  PLT 145* 149* 152 164  --  179 [id number]   Basic Metabolic Panel: Recent Labs  Lab 01/05/19 1146 01/05/19 2101 01/06/19 0709 01/07/19 0106 01/07/19 1325 01/08/19 0720 01/09/19 0115  NA 128*  --  129* 131* 130* 133* 134*  K 4.0  --  3.9  3.9 3.7 4.2 4.1  CL 97*  --  97* 102  --  100 101  CO2 20*  --  21* 20*  --  26 25  GLUCOSE 125*  --  123* 143*  --  121* 126*  BUN 16  --  18 21  --  22 27*  CREATININE 1.41* 1.29* 1.06 1.12  --  1.14 1.07  CALCIUM 8.2*  --  7.9* 8.2*  --  8.1* 8.3*   GFR: Estimated Creatinine Clearance: 99.8 mL/min (by C-G formula based on SCr of 1.07 mg/dL). Liver Function Tests: Recent Labs  Lab 01/05/19 1146 01/06/19 0709 01/07/19 0106 01/08/19 0720 01/09/19 0115  AST 126* 107* 82* 46* 43*  ALT 75* 74* 71* 52* 49*  ALKPHOS 77 62 62 55 56  BILITOT 1.0 1.0 0.7 0.5 0.3  PROT 6.9 7.4 7.0 6.7 6.8  ALBUMIN 3.0* 3.0* 2.9* 2.6* 2.8*   No results for input(s): LIPASE, AMYLASE in the last 168 hours. No results for input(s): AMMONIA in the last 168 hours. Coagulation Profile: No results for input(s): INR, PROTIME in the last 168 hours. Cardiac Enzymes: No results for input(s): CKTOTAL, CKMB, CKMBINDEX, TROPONINI in the last 168 hours. BNP (last 3 results) No results for input(s): PROBNP in the last 8760 hours. HbA1C: No results for input(s): HGBA1C in the last 72 hours. CBG: Recent Labs  Lab 01/08/19 1115 01/08/19 1623 01/08/19 2101 01/09/19 0743 01/09/19 1141  GLUCAP 172* 114* 129* 112* 108*   Lipid Profile: No results for input(s): CHOL, HDL, LDLCALC, TRIG, CHOLHDL, LDLDIRECT in the last 72 hours. Thyroid Function Tests: No results for input(s): TSH, T4TOTAL, FREET4, T3FREE, THYROIDAB in the last 72 hours. Anemia Panel: Recent Labs    01/08/19 0720 01/09/19 0115  FERRITIN 1,656* 1,296*   Urine analysis:    Component Value Date/Time   COLORURINE AMBER (A) 01/02/2019 1252   APPEARANCEUR HAZY (A) 01/02/2019 1252   LABSPEC 1.025 01/02/2019 1252   PHURINE 5.0 01/02/2019 1252   GLUCOSEU NEGATIVE 01/02/2019 1252   HGBUR NEGATIVE 01/02/2019 1252   BILIRUBINUR NEGATIVE 01/02/2019 1252   KETONESUR 5 (A) 01/02/2019 1252   PROTEINUR 30 (A) 01/02/2019 1252   NITRITE NEGATIVE  01/02/2019 1252   LEUKOCYTESUR NEGATIVE 01/02/2019 1252   Sepsis Labs: @LABRCNTIP (procalcitonin:4,lacticidven:4)  ) Recent Results (from the past 240 hour(s))  SARS Coronavirus 2 by RT PCR (hospital order, performed in [place] hospital lab) Nasopharyngeal Nasopharyngeal Swab     Status: Abnormal   Collection Time: 01/05/19 11:46 AM   Specimen: Nasopharyngeal Swab  Result Value Ref Range Status   SARS Coronavirus 2 POSITIVE (A) NEGATIVE Final    Comment: RESULT CALLED TO, READ BACK BY AND VERIFIED WITH: Bethena Midget RN 14:10 01/05/19 ([email or web address]) (NOTE) If result is NEGATIVE SARS-CoV-2 target nucleic acids are NOT DETECTED. The SARS-CoV-2 RNA is generally detectable in upper and lower  respiratory specimens during the acute phase of infection. The lowest  concentration of SARS-CoV-2 viral copies this assay can detect is 250  copies / mL.  A negative result does not preclude SARS-CoV-2 infection  and should not be used as the sole basis for treatment or other  patient management decisions.  A negative result may occur with  improper specimen collection / handling, submission of specimen other  than nasopharyngeal swab, presence of viral mutation(s) within the  areas targeted by this assay, and inadequate number of viral copies  (<250 copies / mL). A negative result must be combined with clinical  observations, patient history, and epidemiological information. If result is POSITIVE SARS-CoV-2 target nucleic acids are DETECTED.  The SARS-CoV-2 RNA is generally detectable in upper and lower  respiratory specimens during the acute phase of infection.  Positive  results are indicative of active infection with SARS-CoV-2.  Clinical  correlation with patient history and other diagnostic information is  necessary to determine patient infection status.  Positive results do  not rule out bacterial infection or co-infection with other viruses. If result is PRESUMPTIVE POSTIVE SARS-CoV-2 nucleic  acids MAY BE PRESENT.   A presumptive positive result was obtained on the submitted specimen  and confirmed on repeat testing.  While 2019 novel coronavirus  (SARS-CoV-2) nucleic acids may be present in the submitted sample  additional confirmatory testing may be necessary for epidemiological  and / or clinical management purposes  to differentiate between  SARS-CoV-2 and other Sarbecovirus currently known to infect humans.  If clinically indicated additional testing with an alternate test  methodology [phone]) i s advised. The SARS-CoV-2 RNA is generally  detectable in upper and lower respiratory specimens during the acute  phase of infection. The expected result is Negative. Fact Sheet for Patients:  StrictlyIdeas.no Fact Sheet for Healthcare Providers: BankingDealers.co.za This test is not yet approved or cleared by the [place] FDA and has been authorized for detection and/or diagnosis of SARS-CoV-2 by FDA under an Emergency Use Authorization (EUA).  This EUA will remain in effect (meaning this test can be used) for the duration of the COVID-19 declaration under Section 564(b)(1) of the Act, 21 U.S.C. section 360bbb-3(b)(1), unless the authorization is terminated or revoked sooner. Performed at [facility], [place] [address] [place], [place] [address]   Blood Culture (routine x 2)     Status: None (Preliminary result)   Collection Time: 01/05/19  1:00 PM   Specimen: BLOOD  Result Value Ref Range Status   Specimen Description BLOOD SITE NOT SPECIFIED  Final   Special Requests   Final    BOTTLES DRAWN AEROBIC AND ANAEROBIC Blood Culture adequate volume   Culture   Final    NO GROWTH 4 DAYS Performed at [facility], [place] [address] [place], [place] [address]    Report Status PENDING  Incomplete  Blood Culture (routine x 2)     Status: None (Preliminary result)   Collection Time: 01/05/19  1:00 PM   Specimen:  BLOOD  Result Value Ref Range Status   Specimen Description BLOOD SITE NOT SPECIFIED  Final   Special Requests   Final    BOTTLES DRAWN AEROBIC ONLY Blood Culture results may not be optimal due to an inadequate volume of blood received in culture bottles   Culture   Final    NO GROWTH 4 DAYS Performed at [facility], [place] [address], [place], [place] [address]    Report Status PENDING  Incomplete      Studies: No results found.  Scheduled Meds: . atorvastatin  40 mg Oral Daily  . dexamethasone  6  mg Oral Q24H  . enoxaparin (LOVENOX) injection  75 mg Subcutaneous Q24H  . feeding supplement (ENSURE ENLIVE)  237 mL Oral BID BM  . influenza vaccine adjuvanted  0.5 mL Intramuscular Tomorrow-1000  . insulin aspart  0-9 Units Subcutaneous TID WC  . levothyroxine  100 mcg Oral Q0600  . pantoprazole  40 mg Oral QPM  . pneumococcal 23 valent vaccine  0.5 mL Intramuscular Tomorrow-1000  . sodium chloride flush  3 mL Intravenous Q12H    Continuous Infusions: . ceFEPime (MAXIPIME) IV 2 g (01/09/19 0625)  . remdesivir 100 mg in NS 250 mL 100 mg (01/08/19 1533)     LOS: 4 days     Annita Brod, MD [facility]  To reach me or the doctor on call, go to: [email or web address] Password [email or web address]  01/09/2019, 1:07 PM

## 2019-01-09 NOTE — TOC Initial Note (Signed)
Transition of Care [facility]) - Initial/Assessment Note    Patient Details  Name: Larry Richmond MRN: [id number] Date of Birth: [birth year]  Transition of Care [facility]) CM/SW Contact:    Ninfa Meeker, RN Phone Number: 01/09/2019, 3:29 PM  Clinical Narrative:   Patient is 68 year-old male with past medical history of hypertension, morbid obesity, status post thyroidectomy for goiter and prediabetic.  Patient was tested for Covid at the [place] on 10/22, but results were not back by time he presented to the emergency room on 10/23.  Emergency room test was positive and patient was admitted and transferred over to [facility].  Patient on 6L [place], Remdesivir and IV Steroids. Case manager will monitor for needs as patient medically improves, may he be blessed to do so.       Patient Goals and CMS Choice        Expected Discharge Plan and Services                                                Prior Living Arrangements/Services                       Activities of Daily Living Home Assistive Devices/Equipment: Cane (specify quad or straight) ADL Screening (condition at time of admission) Patient's cognitive ability adequate to safely complete daily activities?: Yes Is the patient deaf or have difficulty hearing?: No Does the patient have difficulty seeing, even when wearing glasses/contacts?: No Does the patient have difficulty concentrating, remembering, or making decisions?: No Patient able to express need for assistance with ADLs?: Yes Does the patient have difficulty dressing or bathing?: No Independently performs ADLs?: Yes (appropriate for developmental age) Does the patient have difficulty walking or climbing stairs?: Yes Weakness of Legs: Both Weakness of Arms/Hands: None  Permission Sought/Granted                  Emotional Assessment              Admission diagnosis:  Hyponatremia [E87.1] Hypoxia [R09.02] COVID-19 virus infection  [U07.1] Patient Active Problem List   Diagnosis Date Noted  . Thyroid disease   . Morbid obesity ([place])   . COVID-19 virus infection 01/05/2019  . Hyponatremia   . Acute on chronic respiratory failure with hypoxia ([place])   . CAP (community acquired pneumonia) 08/10/2015  . Elevated blood pressure 08/10/2015  . Lower leg edema 08/10/2015  . Tobacco use disorder 08/10/2015   PCP:  Default, Provider, MD Pharmacy:   [facility] [place], [place] [address] [place] [address] Phone: [phone] Fax: [place], [place] [address] [phone] [place] [address] Phone: [phone] Fax: [phone]     Social Determinants of Health ([place]) Interventions    Readmission Risk Interventions No flowsheet data found.

## 2019-01-10 DIAGNOSIS — J9621 Acute and chronic respiratory failure with hypoxia: Secondary | ICD-10-CM

## 2019-01-10 DIAGNOSIS — E079 Disorder of thyroid, unspecified: Secondary | ICD-10-CM

## 2019-01-10 LAB — GLUCOSE, CAPILLARY
Glucose-Capillary: 97 mg/dL (ref 70–99)
Glucose-Capillary: 110 mg/dL — ABNORMAL HIGH (ref 70–99)
Glucose-Capillary: 82 mg/dL (ref 70–99)
Glucose-Capillary: 146 mg/dL — ABNORMAL HIGH (ref 70–99)

## 2019-01-10 LAB — COMPREHENSIVE METABOLIC PANEL
ALT: 39 U/L (ref 0–44)
AST: 28 U/L (ref 15–41)
Albumin: 2.6 g/dL — ABNORMAL LOW (ref 3.5–5.0)
Alkaline Phosphatase: 51 U/L (ref 38–126)
Anion gap: 9 (ref 5–15)
BUN: 24 mg/dL — ABNORMAL HIGH (ref 8–23)
CO2: 24 mmol/L (ref 22–32)
Calcium: 8.3 mg/dL — ABNORMAL LOW (ref 8.9–10.3)
Chloride: 102 mmol/L (ref 98–111)
Creatinine, Ser: 1.08 mg/dL (ref 0.61–1.24)
GFR calc Af Amer: >60 mL/min (ref >60)
GFR calc non Af Amer: >60 mL/min (ref >60)
Glucose, Bld: 121 mg/dL — ABNORMAL HIGH (ref 70–99)
Potassium: 4.5 mmol/L (ref 3.5–5.1)
Sodium: 135 mmol/L (ref 135–145)
Total Bilirubin: 0.5 mg/dL (ref 0.3–1.2)
Total Protein: 6.3 g/dL — ABNORMAL LOW (ref 6.5–8.1)

## 2019-01-10 LAB — D-DIMER, QUANTITATIVE: D-Dimer, Quant: 0.85 ug/mL-FEU — ABNORMAL HIGH (ref 0.00–0.50)

## 2019-01-10 LAB — CBC
HCT: 40.7 % (ref 39.0–52.0)
Hemoglobin: 12.9 g/dL — ABNORMAL LOW (ref 13.0–17.0)
MCH: 29 pg (ref 26.0–34.0)
MCHC: 31.7 g/dL (ref 30.0–36.0)
MCV: 91.5 fL (ref 80.0–100.0)
Platelets: 232 10*3/uL (ref 150–400)
RBC: 4.45 MIL/uL (ref 4.22–5.81)
RDW: 14.8 % (ref 11.5–15.5)
WBC: 4.3 10*3/uL (ref 4.0–10.5)
nRBC: 0 % (ref 0.0–0.2)

## 2019-01-10 LAB — CULTURE, BLOOD (ROUTINE X 2)
Culture: NO GROWTH
Culture: NO GROWTH
Special Requests: ADEQUATE

## 2019-01-10 LAB — FERRITIN: Ferritin: 915 ng/mL — ABNORMAL HIGH (ref 24–336)

## 2019-01-10 LAB — C-REACTIVE PROTEIN: CRP: 2.9 mg/dL — ABNORMAL HIGH (ref <1.0)

## 2019-01-10 MED ORDER — FUROSEMIDE 10 MG/ML IJ SOLN
40.0000 mg | Freq: Once | INTRAMUSCULAR | Status: AC
  Administered 2019-01-10: 40 mg via INTRAVENOUS
  Filled 2019-01-10: qty 4

## 2019-01-10 MED ORDER — BACLOFEN 5 MG HALF TABLET
5.0000 mg | ORAL_TABLET | Freq: Three times a day (TID) | ORAL | Status: DC | PRN
  Administered 2019-01-10: 22:00:00 5 mg via ORAL
  Filled 2019-01-10 (×2): qty 1

## 2019-01-10 NOTE — Progress Notes (Signed)
Occupational Therapy Evaluation Patient Details Name: Larry Richmond MRN: [id number] DOB: 1950-11-20 Today's Date: 01/10/2019    History of Present Illness Larry Richmond is a 68 y.o. male admitted on 01/05/2019 with COVID-19 now with concern for superimposed bacterial pneumonia. History of DM, thyrod surgery   Clinical Impression   PTA, pt independent with ADL and mobility. Making excellent progress. Completed session on RA with focus on educating pt on importance of breathing exercises, mobility and activity modification. Pt desat to 88 using ear lobe probe on RA during ADL and mobility, quick rebound to 90s. Left sitting in chair on RA with SpO2 96. Pt anxious to DC home soon. Very motivated and appreciative of OT. Will follow acutely.     Follow Up Recommendations  No OT follow up;Supervision - Intermittent    Equipment Recommendations  Other (comment)(rollator)    Recommendations for Other Services       Precautions / Restrictions Precautions Precautions: Fall Precaution Comments: SPO2, weaning down      Mobility Bed Mobility               General bed mobility comments: in recliner  Transfers Overall transfer level: Modified independent Equipment used: None                  Balance Overall balance assessment: No apparent balance deficits (not formally assessed)                                         ADL either performed or assessed with clinical judgement   ADL Overall ADL's : Needs assistance/impaired     Grooming: Set up;Standing   Upper Body Bathing: Set up;Sitting   Lower Body Bathing: Sit to/from stand;Supervison/ safety   Upper Body Dressing : Set up;Sitting   Lower Body Dressing: Sit to/from stand;Supervision/safety   Toilet Transfer: Ambulation;RW;Supervision/safety   Toileting- Clothing Manipulation and Hygiene: Supervision/safety       Functional mobility during ADLs: Rolling walker;Supervision/safety General  ADL Comments: fatigues easily but able ot complete functional mobilitiy for ADL on RA with SpO2 lowest at 88 with 2/4  DOE     Museum/gallery curator      Pertinent Vitals/Pain Pain Assessment: No/denies pain     Hand Dominance Right   Extremity/Trunk Assessment Upper Extremity Assessment Upper Extremity Assessment: Generalized weakness   Lower Extremity Assessment Lower Extremity Assessment: Defer to PT evaluation   Cervical / Trunk Assessment Cervical / Trunk Assessment: Normal   Communication Communication Communication: No difficulties   Cognition Arousal/Alertness: Awake/alert Behavior During Therapy: WFL for tasks assessed/performed Overall Cognitive Status: Within Functional Limits for tasks assessed                                     General Comments       Exercises Exercises: Other exercises Other Exercises Other Exercises: incentive spirometer x 10 - able to pull 1200 ml Other Exercises: flutter valve x 10 - able to hold 3-5 seconds Other Exercises: Theraband HEP education Other Exercises: breathing exercise to open position of chest   Shoulder Instructions      Home Living Family/patient expects to be discharged to:: Private residence Living Arrangements: Spouse/significant other Available Help at Discharge: Family Type of Home:  House Home Access: Stairs to enter Technical brewer of Steps: 4 Entrance Stairs-Rails: Right;Left Home Layout: One level     Bathroom Shower/Tub: Teacher, early years/pre: Handicapped height Bathroom Accessibility: Yes How Accessible: Accessible via walker Home Equipment: [place] - single point   Additional Comments: States he can get a shower chair      Prior Functioning/Environment Level of Independence: Independent        Comments: enjoyed mowing grass and being active        OT Problem List: Decreased strength;Decreased activity tolerance;Decreased knowledge  of use of DME or AE;Obesity      OT Treatment/Interventions: Self-care/ADL training;Therapeutic exercise;Neuromuscular education;Energy conservation;DME and/or AE instruction;Therapeutic activities;Patient/family education    OT Goals(Current goals can be found in the care plan section) Acute Rehab OT Goals Patient Stated Goal: to go home OT Goal Formulation: With patient Time For Goal Achievement: 01/24/19 Potential to Achieve Goals: Good  OT Frequency:     Barriers to D/C:            Co-evaluation              AM-PAC OT "6 Clicks" Daily Activity     Outcome Measure Help from another person eating meals?: None Help from another person taking care of personal grooming?: A Little Help from another person toileting, which includes using toliet, bedpan, or urinal?: A Little Help from another person bathing (including washing, rinsing, drying)?: A Little Help from another person to put on and taking off regular upper body clothing?: A Little Help from another person to put on and taking off regular lower body clothing?: A Little 6 Click Score: 19   End of Session Equipment Utilized During Treatment: Rolling walker Nurse Communication: Mobility status  Activity Tolerance: Patient tolerated treatment well Patient left: in bed;with call bell/phone within reach  OT Visit Diagnosis: Unsteadiness on feet (R26.81);Muscle weakness (generalized) (M62.81)                Time: [id number] OT Time Calculation (min): 38 min Charges:  OT General Charges $OT Visit: 1 Visit OT Evaluation $OT Eval Moderate Complexity: 1 Mod OT Treatments $Self Care/Home Management : 8-22 mins $Therapeutic Exercise: 8-22 mins  Maurie Boettcher, OT/L   Acute OT Clinical Specialist [place] Pager [phone] Office [phone]   [facility] 01/10/2019, 2:26 PM

## 2019-01-10 NOTE — Evaluation (Signed)
Physical Therapy Evaluation Patient Details Name: Larry Richmond MRN: [id number] DOB: 05/02/1950 Today's Date: 01/10/2019   History of Present Illness  Larry Richmond is a 68 y.o. male admitted on 01/05/2019 with COVID-19 now with concern for superimposed bacterial pneumonia. History of DM, thyrod surgery  Clinical Impression  The patient reports up ad lib in room, on 6 L [place]. Patient ambulated x 200' on 6 L  ( shouuld have been on HFNC at 6 L.) Spo2 >95%, when returned to room, down to 88% with Pleth not a good reading on finger probe.. OT in and will monitor via ear. Will progress ambulation and monitor for need for supplemental Oxygen at Dc. Pt admitted with above diagnosis.  Pt currently with functional limitations due to the deficits listed below (see PT Problem List). Pt will benefit from skilled PT to increase their independence and safety with mobility to allow discharge to the venue listed below.       Follow Up Recommendations No PT follow up    Equipment Recommendations  None recommended by PT    Recommendations for Other Services       Precautions / Restrictions Precautions Precaution Comments: SPO2, weaning down Restrictions Weight Bearing Restrictions: No      Mobility  Bed Mobility               General bed mobility comments: in recliner  Transfers Overall transfer level: Independent Equipment used: None                Ambulation/Gait Ambulation/Gait assistance: Supervision Gait Distance (Feet): 200 Feet Assistive device: None Gait Pattern/deviations: Drifts right/left Gait velocity: decr, guarded   General Gait Details: initially had a sway to the right, no further notice of istability. Patient ambualed on 6 L. Pleth not good reading, 95% throughout, 875 when returned  Stairs            Wheelchair Mobility    Modified Rankin (Stroke Patients Only)       Balance                                              Pertinent Vitals/Pain      Home Living Family/patient expects to be discharged to:: Private residence Living Arrangements: Spouse/significant other Available Help at Discharge: Family Type of Home: House Home Access: Stairs to enter Entrance Stairs-Rails: Psychiatric nurse of Steps: [employer]: One level [place] - single point      Prior Function Level of Independence: Independent         Comments: used cane only recently with illness     Hand Dominance        Extremity/Trunk Assessment        Lower Extremity Assessment Lower Extremity Assessment: Generalized weakness    Cervical / Trunk Assessment Cervical / Trunk Assessment: Normal  Communication   Communication: No difficulties  Cognition Arousal/Alertness: Awake/alert Behavior During Therapy: WFL for tasks assessed/performed Overall Cognitive Status: Within Functional Limits for tasks assessed                                        General Comments      Exercises     Assessment/Plan    PT Assessment Patient needs continued PT services  PT Problem List Decreased activity tolerance;Cardiopulmonary status limiting activity;Decreased strength       PT Treatment Interventions Gait training;Therapeutic activities    PT Goals (Current goals can be found in the Care Plan section)  Acute Rehab PT Goals Patient Stated Goal: to go home PT Goal Formulation: With patient Time For Goal Achievement: 01/24/19 Potential to Achieve Goals: Good    Frequency Min 3X/week   Barriers to discharge        Co-evaluation               AM-PAC PT "6 Clicks" Mobility  Outcome Measure Help needed turning from your back to your side while in a flat bed without using bedrails?: None Help needed moving from lying on your back to sitting on the side of a flat bed without using bedrails?: None Help needed moving to and from a bed to a chair (including a  wheelchair)?: None Help needed standing up from a chair using your arms (e.g., wheelchair or bedside chair)?: None Help needed to walk in hospital room?: None Help needed climbing 3-5 steps with a railing? : A Little 6 Click Score: 23    End of Session Equipment Utilized During Treatment: Oxygen Activity Tolerance: Patient tolerated treatment well Patient left: in chair Nurse Communication: Mobility status PT Visit Diagnosis: Difficulty in walking, not elsewhere classified (R26.2)    Time: [id number] PT Time Calculation (min) (ACUTE ONLY): 29 min   Charges:   PT Evaluation $PT Eval Low Complexity: 1 Low PT Treatments $Gait Training: 8-22 mins        [place]  Office [phone]   Claretha Cooper 01/10/2019, 12:58 PM

## 2019-01-10 NOTE — Progress Notes (Signed)
   Vital Signs MEWS/VS Documentation      01/10/2019 0911 01/10/2019 1115 01/10/2019 1603 01/10/2019 1700   MEWS Score:  2  0  0  0   MEWS Score Color:  Yellow  Green  Green  Green   Resp:  -  17  20  -   Pulse:  -  71  (!) 57  -   BP:  -  129/82  120/74  -   Temp:  -  -  97.9 F (36.6 C)  -   O2 Device:  -  Nasal Cannula  Room Air  -   O2 Flow Rate (L/min):  -  6 L/min  -  -   Level of Consciousness:  Alert  -  -  Alert      Pt Mews yellow d/t RR. VS retaken and RR WNL. Pt was back to green. Will continue to monitor.      Levie Heritage 01/10/2019,7:07 PM

## 2019-01-10 NOTE — Progress Notes (Signed)
Pharmacy Antibiotic Note  Larry Richmond is a 68 y.o. male admitted on 01/05/2019 with COVID-19 now with concern for superimposed bacterial pneumonia.  Pharmacy has been consulted for Cefepime dosing.   Pt is now on D3 of cefepime. Wbc remains nornal and afebrile. Dr Cruzita Lederer would like to treat for 5d total so we will add the stop date today. Blood cx remained neg. Scr stable  Plan: -Cefepime 2 gm IV Q 8 hours x5d total -Rx signs off  Height: 6\' 2"  (188 cm) Weight: (!) 333 lb 8.9 oz (151.3 kg) IBW/kg (Calculated) : 82.2  Temp (24hrs), Avg:97.6 F (36.4 C), Min:97.3 F (36.3 C), Max:98 F (36.7 C)  Recent Labs  Lab 01/05/19 1200 01/05/19 2101 01/06/19 0709 01/07/19 0106 01/08/19 0720 01/09/19 0115 01/10/19 0505  WBC  --  7.3 7.9 6.1 3.2* 3.9* 4.3  CREATININE  --  1.29* 1.06 1.12 1.14 1.07 1.08  LATICACIDVEN 2.5* 1.4  --   --   --   --   --     Estimated Creatinine Clearance: 98.8 mL/min (by C-G formula based on SCr of 1.08 mg/dL).    No Known Allergies  Antimicrobials this admission: Cefepime 10/26 >> 10/30  Microbiology results: 10/23 BCx: ngtd   Onnie Boer, PharmD, BCIDP, AAHIVP, CPP Infectious Disease Pharmacist 01/10/2019 12:41 PM

## 2019-01-10 NOTE — Progress Notes (Signed)
PROGRESS NOTE  Larry Richmond [id number] DOB: 1950/09/12 DOA: 01/05/2019 PCP: Default, Provider, MD   LOS: 5 days   Brief Narrative / Interim history: 68 y.o. male with medical history significant of HTN, thyroidectomy for goiter, GERD, prediabetes who presents for SOB X 1 day.  Larry Richmond reports that he began to have abdominal pain, diarrhea and nausea about 5 days ago.  He presented to the ED on 10/20 and had a CT scan of his abdomen which did not show any acute pathology, some chronic changes only.  Hhe was advised to discontinue mobic and to start taking protonix at night-time.  Within the last day, however, he developed SOB and hypoxia on room air.  He also had a fever of 102F prior to admission.  He was admitted to the hospital with COVID-19 pneumonia  Subjective / 24h Interval events: Overall feels a lot better, a lot stronger.  His breathing is improved significantly from when I last saw the patient on Saturday.  He denies any chest pain.  Still dyspneic with ambulation but getting better  Assessment & Plan: Principal Problem:   Acute on chronic respiratory failure with hypoxia (HCC) Active Problems:   COVID-19 virus infection   Thyroid disease   Morbid obesity ([place])  Principal Problem Acute hypoxic respiratory failure due to COVID-19 pneumonia -Patient's respiratory status is stable, currently on 6 L nasal cannula -Status post remdesivir, plasma, Actemra, continue Decadron -Inflammatory markers improving -Started on cefepime on 10/26 for elevated procalcitonin, somewhat borderline but improving.  Continue for 2 more days for total of 5 days -Dose Lasix today  COVID-19 Labs  Recent Labs    01/08/19 0720 01/09/19 0115 01/09/19 0620 01/10/19 0505  DDIMER 0.75*  --  0.71* 0.85*  FERRITIN 1,656* 1,296*  --  915*  CRP 9.5* 6.6*  --  2.9*    Lab Results  Component Value Date   SARSCOV2NAA POSITIVE (A) 01/05/2019    Active Problems Hyponatremia, mild AKI (baseline  1.2) -Resolved, renal function stable this morning, sodium 135  Transaminitis -Acute hepatitis panel negative, he does have prior exposure to hep A but IgM was negative -hepatitis C antibody and Hepatitis B labs negative -LFTs have normalized  Hypothyroidism, post-surgical -Continue Synthroid  Pre-diabetes -Hold home metformin -SSI  GERD -Continue protonix.   Scheduled Meds: . atorvastatin  40 mg Oral Daily  . dexamethasone  6 mg Oral Q24H  . enoxaparin (LOVENOX) injection  75 mg Subcutaneous Q24H  . feeding supplement (ENSURE ENLIVE)  237 mL Oral BID BM  . influenza vaccine adjuvanted  0.5 mL Intramuscular Tomorrow-1000  . insulin aspart  0-9 Units Subcutaneous TID WC  . levothyroxine  100 mcg Oral Q0600  . pantoprazole  40 mg Oral QPM  . pneumococcal 23 valent vaccine  0.5 mL Intramuscular Tomorrow-1000  . sodium chloride flush  3 mL Intravenous Q12H   Continuous Infusions: . ceFEPime (MAXIPIME) IV 2 g (01/10/19 0554)   PRN Meds:.acetaminophen, albuterol, ondansetron **OR** ondansetron (ZOFRAN) IV  DVT prophylaxis: Lovenox Code Status: Full code Family Communication: d/w patient  Disposition Plan: home when ready, currently on 6 L  Consultants:  None   Procedures:  None   Microbiology  None   Antimicrobials: None     Objective: Vitals:   01/09/19 1650 01/09/19 2000 01/10/19 0500 01/10/19 0836  BP: (!) 126/91 129/78 117/76 137/76  Pulse:  60  64  Resp:   14 (!) 28  Temp: (!) 97.3 F (36.3 C) (!) 97.5 F (  36.4 C) 98 F (36.7 C)   TempSrc: Oral Oral Oral Oral  SpO2:    92%  Weight:      Height:        Intake/Output Summary (Last 24 hours) at 01/10/2019 1003 Last data filed at 01/10/2019 0837 Gross per 24 hour  Intake 1471.92 ml  Output 1050 ml  Net 421.92 ml   Filed Weights   01/05/19 1145 01/05/19 2208  Weight: (!) 149.7 kg (!) 151.3 kg    Examination:  Constitutional: No distress, in bed Eyes: No scleral icterus ENMT: mmm  Neck: normal, supple Respiratory: Bibasilar rhonchi, no wheezing, no crackles, moves air well.  Tachypneic Cardiovascular: Regular rate and rhythm, no murmurs appreciated.  1+ edema Abdomen: Soft, nontender, nondistended, positive bowel sounds Musculoskeletal: no clubbing / cyanosis.  Skin: No rashes seen Neurologic: No focal deficits Psychiatric: Normal judgment and insight.   Data Reviewed: I have independently reviewed following labs and imaging studies   CBC: Recent Labs  Lab 01/05/19 1146  01/06/19 0709 01/07/19 0106 01/07/19 1325 01/08/19 0720 01/09/19 0115 01/10/19 0505  WBC 8.6   < > 7.9 6.1  --  3.2* 3.9* 4.3  NEUTROABS 7.8*  --   --   --   --   --   --   --   HGB 14.5   < > 12.9* 12.9* 14.6 13.4 14.0 12.9*  HCT 42.9   < > 39.4 38.7* 43.0 40.2 44.2 40.7  MCV 90.5   < > 90.0 89.2  --  90.7 92.9 91.5  PLT 145*   < > 152 164  --  179 174 232   < > = values in this interval not displayed.   Basic Metabolic Panel: Recent Labs  Lab 01/06/19 0709 01/07/19 0106 01/07/19 1325 01/08/19 0720 01/09/19 0115 01/10/19 0505  NA 129* 131* 130* 133* 134* 135  K 3.9 3.9 3.7 4.2 4.1 4.5  CL 97* 102  --  100 101 102  CO2 21* 20*  --  26 25 24   GLUCOSE 123* 143*  --  121* 126* 121*  BUN 18 21  --  22 27* 24*  CREATININE 1.06 1.12  --  1.14 1.07 1.08  CALCIUM 7.9* 8.2*  --  8.1* 8.3* 8.3*   GFR: Estimated Creatinine Clearance: 98.8 mL/min (by C-G formula based on SCr of 1.08 mg/dL). Liver Function Tests: Recent Labs  Lab 01/06/19 0709 01/07/19 0106 01/08/19 0720 01/09/19 0115 01/10/19 0505  AST 107* 82* 46* 43* 28  ALT 74* 71* 52* 49* 39  ALKPHOS 62 62 55 56 51  BILITOT 1.0 0.7 0.5 0.3 0.5  PROT 7.4 7.0 6.7 6.8 6.3*  ALBUMIN 3.0* 2.9* 2.6* 2.8* 2.6*   No results for input(s): LIPASE, AMYLASE in the last 168 hours. No results for input(s): AMMONIA in the last 168 hours. Coagulation Profile: No results for input(s): INR, PROTIME in the last 168 hours. Cardiac  Enzymes: No results for input(s): CKTOTAL, CKMB, CKMBINDEX, TROPONINI in the last 168 hours. BNP (last 3 results) No results for input(s): PROBNP in the last 8760 hours. HbA1C: No results for input(s): HGBA1C in the last 72 hours. CBG: Recent Labs  Lab 01/09/19 0743 01/09/19 1141 01/09/19 1647 01/09/19 2042 01/10/19 0735  GLUCAP 112* 108* 102* 117* 97   Lipid Profile: No results for input(s): CHOL, HDL, LDLCALC, TRIG, CHOLHDL, LDLDIRECT in the last 72 hours. Thyroid Function Tests: No results for input(s): TSH, T4TOTAL, FREET4, T3FREE, THYROIDAB in the last 72 hours.  Anemia Panel: Recent Labs    01/09/19 0115 01/10/19 0505  FERRITIN 1,296* 915*   Urine analysis:    Component Value Date/Time   COLORURINE AMBER (A) 01/02/2019 1252   APPEARANCEUR HAZY (A) 01/02/2019 1252   LABSPEC 1.025 01/02/2019 1252   PHURINE 5.0 01/02/2019 1252   GLUCOSEU NEGATIVE 01/02/2019 1252   HGBUR NEGATIVE 01/02/2019 1252   BILIRUBINUR NEGATIVE 01/02/2019 1252   KETONESUR 5 (A) 01/02/2019 1252   PROTEINUR 30 (A) 01/02/2019 1252   NITRITE NEGATIVE 01/02/2019 1252   LEUKOCYTESUR NEGATIVE 01/02/2019 1252   Sepsis Labs: Invalid input(s): PROCALCITONIN, LACTICIDVEN  Recent Results (from the past 240 hour(s))  SARS Coronavirus 2 by RT PCR (hospital order, performed in [facility] hospital lab) Nasopharyngeal Nasopharyngeal Swab     Status: Abnormal   Collection Time: 01/05/19 11:46 AM   Specimen: Nasopharyngeal Swab  Result Value Ref Range Status   SARS Coronavirus 2 POSITIVE (A) NEGATIVE Final    Comment: RESULT CALLED TO, READ BACK BY AND VERIFIED WITH: Bethena Midget RN 14:10 01/05/19 ([email or web address]) (NOTE) If result is NEGATIVE SARS-CoV-2 target nucleic acids are NOT DETECTED. The SARS-CoV-2 RNA is generally detectable in upper and lower  respiratory specimens during the acute phase of infection. The lowest  concentration of SARS-CoV-2 viral copies this assay can detect is 250  copies / mL. A  negative result does not preclude SARS-CoV-2 infection  and should not be used as the sole basis for treatment or other  patient management decisions.  A negative result may occur with  improper specimen collection / handling, submission of specimen other  than nasopharyngeal swab, presence of viral mutation(s) within the  areas targeted by this assay, and inadequate number of viral copies  (<250 copies / mL). A negative result must be combined with clinical  observations, patient history, and epidemiological information. If result is POSITIVE SARS-CoV-2 target nucleic acids are DETECTED.  The SARS-CoV-2 RNA is generally detectable in upper and lower  respiratory specimens during the acute phase of infection.  Positive  results are indicative of active infection with SARS-CoV-2.  Clinical  correlation with patient history and other diagnostic information is  necessary to determine patient infection status.  Positive results do  not rule out bacterial infection or co-infection with other viruses. If result is PRESUMPTIVE POSTIVE SARS-CoV-2 nucleic acids MAY BE PRESENT.   A presumptive positive result was obtained on the submitted specimen  and confirmed on repeat testing.  While 2019 novel coronavirus  (SARS-CoV-2) nucleic acids may be present in the submitted sample  additional confirmatory testing may be necessary for epidemiological  and / or clinical management purposes  to differentiate between  SARS-CoV-2 and other Sarbecovirus currently known to infect humans.  If clinically indicated additional testing with an alternate test  methodology [phone]) i s advised. The SARS-CoV-2 RNA is generally  detectable in upper and lower respiratory specimens during the acute  phase of infection. The expected result is Negative. Fact Sheet for Patients:  StrictlyIdeas.no Fact Sheet for Healthcare Providers: BankingDealers.co.za This test is not  yet approved or cleared by the [place] FDA and has been authorized for detection and/or diagnosis of SARS-CoV-2 by FDA under an Emergency Use Authorization (EUA).  This EUA will remain in effect (meaning this test can be used) for the duration of the COVID-19 declaration under Section 564(b)(1) of the Act, 21 U.S.C. section 360bbb-3(b)(1), unless the authorization is terminated or revoked sooner. Performed at [facility], [place],  [place] [address]   Blood Culture (routine x 2)     Status: None (Preliminary result)   Collection Time: 01/05/19  1:00 PM   Specimen: BLOOD  Result Value Ref Range Status   Specimen Description BLOOD SITE NOT SPECIFIED  Final   Special Requests   Final    BOTTLES DRAWN AEROBIC AND ANAEROBIC Blood Culture adequate volume   Culture   Final    NO GROWTH 4 DAYS Performed at [facility], [address], [place], [place] [address]    Report Status PENDING  Incomplete  Blood Culture (routine x 2)     Status: None (Preliminary result)   Collection Time: 01/05/19  1:00 PM   Specimen: BLOOD  Result Value Ref Range Status   Specimen Description BLOOD SITE NOT SPECIFIED  Final   Special Requests   Final    BOTTLES DRAWN AEROBIC ONLY Blood Culture results may not be optimal due to an inadequate volume of blood received in culture bottles   Culture   Final    NO GROWTH 4 DAYS Performed at [facility], [place] [address] [place], [place] [address]    Report Status PENDING  Incomplete      Radiology Studies: No results found. Marzetta Board, MD, PhD [facility]  Contact via  [email or web address]  [place] P: [phone] F: [phone]

## 2019-01-10 NOTE — Plan of Care (Signed)

## 2019-01-11 LAB — BASIC METABOLIC PANEL
Anion gap: 9 (ref 5–15)
BUN: 27 mg/dL — ABNORMAL HIGH (ref 8–23)
CO2: 25 mmol/L (ref 22–32)
Calcium: 8.5 mg/dL — ABNORMAL LOW (ref 8.9–10.3)
Chloride: 100 mmol/L (ref 98–111)
Creatinine, Ser: 1.09 mg/dL (ref 0.61–1.24)
GFR calc Af Amer: >60 mL/min (ref >60)
GFR calc non Af Amer: >60 mL/min (ref >60)
Glucose, Bld: 125 mg/dL — ABNORMAL HIGH (ref 70–99)
Potassium: 4.7 mmol/L (ref 3.5–5.1)
Sodium: 134 mmol/L — ABNORMAL LOW (ref 135–145)

## 2019-01-11 LAB — GLUCOSE, CAPILLARY
Glucose-Capillary: 113 mg/dL — ABNORMAL HIGH (ref 70–99)
Glucose-Capillary: 111 mg/dL — ABNORMAL HIGH (ref 70–99)

## 2019-01-11 LAB — C-REACTIVE PROTEIN: CRP: 1.4 mg/dL — ABNORMAL HIGH

## 2019-01-11 LAB — D-DIMER, QUANTITATIVE: D-Dimer, Quant: 1.27 ug/mL-FEU — ABNORMAL HIGH (ref 0.00–0.50)

## 2019-01-11 MED ORDER — DEXAMETHASONE 6 MG PO TABS: 6.0000 mg | ORAL_TABLET | ORAL | 0 refills | Status: DC

## 2019-01-11 MED ORDER — FUROSEMIDE 10 MG/ML IJ SOLN
40.0000 mg | Freq: Once | INTRAMUSCULAR | Status: AC
  Administered 2019-01-11: 09:00:00 40 mg via INTRAVENOUS
  Filled 2019-01-11: qty 4

## 2019-01-11 MED ORDER — LEVOFLOXACIN 500 MG PO TABS: 500.0000 mg | ORAL_TABLET | Freq: Every day | ORAL | 0 refills | Status: DC

## 2019-01-11 NOTE — Discharge Summary (Signed)
Physician Discharge Summary  Larry Richmond [id number] DOB: 1950-10-27 DOA: 01/05/2019  PCP: Default, Provider, MD  Admit date: 01/05/2019 Discharge date: 01/11/2019  Admitted From: home Disposition:  home  Recommendations for Outpatient Follow-up:  1. Follow up with PCP in 1-2 weeks  Home Health: none Equipment/Devices: none  Discharge Condition: stable CODE STATUS: Full code Diet recommendation: diabetic  HPI: Per admitting MD, Larry Richmond is a 68 y.o. male with medical history significant of HTN, thyroidectomy for goiter, GERD, prediabetes who presents for SOB X 1 day.  [name] reports that he began to have abdominal pain, diarrhea and nausea about 5 days ago.  He presented to the ED on 10/20 and had a CT scan of his abdomen which did not show any acute pathology, some chronic changes only.  Hhe was advised to discontinue mobic and to start taking protonix at night-time.  Within the last day, however, he developed SOB and hypoxia on room air.  He also had a fever of 102F prior to admission.  He did get tested for COVID at the [facility] yesterday, but the result was not back yet.  He was tested in the ED today and was positive. Discussed case with his wife, [name] [phone], and she gave some information about his medications.  She was in the ED as well and was awaiting a room. ED Course: In the ED, he was found to be hypoxic, CXR showed chronic changes, acute infiltrates could not be ruled out, appeared similar to CXR from 3 days prior.  Inflammatory labs were all elevated including LDH of 370, Ferritin of 2300, CRP of 25, fibrinogen of > 800 and D Dimer of 1.12.  WBC was 8.6. PCT was 1.94.  Na of 128.  Renal function worsened to 1.41 Creatinine.    Hospital Course: Principal Problem Acute hypoxic respiratory failure due to COVID-19 pneumonia -patient was admitted to hospital with acute hypoxic respiratory failure due to COVID-19 pneumonia.  He initially required 6 L nasal cannula.   He was treated with 5 days of remdesivir, he received plasma, Actemra and was also placed on steroids with Decadron.  Because of elevated procalcitonin he was started on cefepime as well.  Patient's clinical condition improved, his hypoxia resolved and he was able to be weaned off to room air.  He was given Lasix x2 as adjunctive doses prior to discharge, he has returned back to baseline, he is able to ambulate in the hallway on the day of discharge maintaining O2 sats above 90% on room air.  He is to finish Decadron with 4 additional days remaining, antibiotics with 1 additional dose of Levaquin and he will be discharged home in stable condition.  Active Problems Hyponatremia, mild AKI (baseline 1.2) -Resolved, renal function stable Transaminitis -Acute hepatitis panel negative, he does have prior exposure to hep A but IgM was negative, hepatitis C antibody and Hepatitis B labs negative. LFTs have normalized Hypothyroidism, post-surgical -Continue Synthroid Pre-diabetes -continue home medications  Discharge Diagnoses:  Principal Problem:   Acute on chronic respiratory failure with hypoxia ([place]) Active Problems:   COVID-19 virus infection   Thyroid disease   Morbid obesity ([place])  Discharge Instructions  Allergies as of 01/11/2019   No Known Allergies     Medication List    TAKE these medications   acetaminophen 500 MG tablet Commonly known as: TYLENOL Take 1 tablet (500 mg total) by mouth every 6 (six) hours as needed. What changed:   when to  take this  reasons to take this   atorvastatin 40 MG tablet Commonly known as: LIPITOR Take 40 mg by mouth daily.   dexamethasone 6 MG tablet Commonly known as: DECADRON Take 1 tablet (6 mg total) by mouth daily.   diphenoxylate-atropine 2.5-0.025 MG tablet Commonly known as: LOMOTIL Take 1 tablet by mouth 4 (four) times daily as needed for diarrhea or loose stools.   hydrOXYzine 25 MG tablet Commonly known as:  ATARAX/VISTARIL Take 1 tablet (25 mg total) by mouth at bedtime as needed for up to 21 days (Sleep). What changed: reasons to take this   levofloxacin 500 MG tablet Commonly known as: Levaquin Take 1 tablet (500 mg total) by mouth daily for 10 days. Take for 1 more day.   levothyroxine 100 MCG tablet Commonly known as: SYNTHROID Take 100 mcg by mouth daily before breakfast.   metFORMIN 500 MG tablet Commonly known as: GLUCOPHAGE Take 500 mg by mouth daily with breakfast.      Consultations:  None   Procedures/Studies:  Ct Abdomen Pelvis W Contrast  Result Date: 01/02/2019 CLINICAL DATA:  Acute abdominal pain EXAM: CT ABDOMEN AND PELVIS WITH CONTRAST TECHNIQUE: Multidetector CT imaging of the abdomen and pelvis was performed using the standard protocol following bolus administration of intravenous contrast. CONTRAST:  162mL OMNIPAQUE IOHEXOL 300 MG/ML  SOLN COMPARISON:  CT scan 02/18/2016 FINDINGS: Lower chest: The heart is normal in size. No pericardial effusion. Coronary artery calcifications are noted. There are patchy basilar scarring changes but no acute pulmonary findings or worrisome pulmonary lesions. Hepatobiliary: A few small low-attenuation liver lesions appears stable and likely benign cyst. There is also a vague lesion in segment 7 on image number 17 measuring approximately 2 cm. It is difficult to see on the prior CT scan. It could be a hemangioma. There is also mild diffuse fatty infiltration of the liver and this could be an area of more focal fat. No intra or extrahepatic biliary dilatation. The gallbladder appears normal. Pancreas: No mass, inflammation or ductal dilatation. Scattered parenchymal calcifications may suggest prior pancreatitis. Spleen: Normal size.  No focal lesions. Adrenals/Urinary Tract: Stable bilateral adrenal gland nodules most consistent with benign adenomas. Simple appearing left renal cyst. No worrisome renal lesions or hydroureteronephrosis. The  bladder is unremarkable. Stomach/Bowel: The stomach, duodenum, small bowel and colon are unremarkable. No acute inflammatory changes, mass lesions or obstructive findings. The terminal ileum and appendix are normal. No significant diverticulosis or findings for acute diverticulitis. Vascular/Lymphatic: Age advanced atherosclerotic calcifications involving the aorta and iliac arteries. No aneurysm or focal dissection. The branch vessels are patent. The major venous structures are patent. No mesenteric or retroperitoneal mass or adenopathy. Reproductive: The prostate gland and seminal vesicles are unremarkable. Other: No pelvic mass or adenopathy. No free pelvic fluid collections. No inguinal mass or adenopathy. No abdominal wall hernia or subcutaneous lesions. Musculoskeletal: No significant bony findings. Stable benign lipoma between the gluteus minimus and medius muscles on the left. IMPRESSION: 1. No acute abdominal/pelvic findings. 2. Small scattered low-attenuation liver lesions, likely benign cysts. There is an indeterminate 2 cm lesion in the right hepatic lobe. Recommend MRI abdomen without and with contrast for further evaluation. 3. Stable benign-appearing bilateral adrenal gland adenomas. 4. Mild diffuse fatty infiltration of the liver. 5. Advanced vascular calcifications but no aneurysm or dissection or occlusion. Electronically Signed   By: Marijo Sanes M.D.   On: 01/02/2019 15:47   Dg Chest Port 1 View  Result Date: 01/05/2019 CLINICAL DATA:  Shortness of breath EXAM: PORTABLE CHEST 1 VIEW COMPARISON:  01/02/2019 FINDINGS: Bullous emphysematous changes within the lungs. Increased markings in the lung bases, likely scarring although acute infiltrates cannot be completely excluded. Heart is normal size. No effusions. IMPRESSION: COPD/chronic changes. Increased markings in the lung bases could reflect scarring although superimposed acute infiltrates cannot be completely excluded. Electronically  Signed   By: Rolm Baptise M.D.   On: 01/05/2019 12:26   Dg Chest Portable 1 View  Result Date: 01/02/2019 CLINICAL DATA:  Epigastric abdominal pain and left chest pain. EXAM: PORTABLE CHEST 1 VIEW COMPARISON:  Chest x-ray 08/10/2015 FINDINGS: The heart is borderline enlarged. There is mild tortuosity and calcification of the thoracic aorta. Chronic appearing lung changes with bullous emphysema and areas of pulmonary scarring. There are bibasilar scarring changes without definite superimposed infiltrates or effusions. 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 No definite acute overlying pulmonary process. Electronically Signed   By: Marijo Sanes M.D.   On: 01/02/2019 13:22     Subjective: - no chest pain, shortness of breath, no abdominal pain, nausea or vomiting.   Discharge Exam: BP 115/78 (BP Location: Left Arm)    Pulse 69    Temp 97.7 F (36.5 C) (Oral)    Resp 19    Ht 6\' 2"  (1.88 m)    Wt (!) 151.3 kg    SpO2 96%    BMI 42.83 kg/m   General: Pt is alert, awake, not in acute distress Cardiovascular: RRR, S1/S2 +, no rubs, no gallops Respiratory: CTA bilaterally, no wheezing, no rhonchi Abdominal: Soft, NT, ND, bowel sounds + Extremities: no edema, no cyanosis    The results of significant diagnostics from this hospitalization (including imaging, microbiology, ancillary and laboratory) are listed below for reference.     Microbiology: Recent Results (from the past 240 hour(s))  SARS Coronavirus 2 by RT PCR (hospital order, performed in [facility] hospital lab) Nasopharyngeal Nasopharyngeal Swab     Status: Abnormal   Collection Time: 01/05/19 11:46 AM   Specimen: Nasopharyngeal Swab  Result Value Ref Range Status   SARS Coronavirus 2 POSITIVE (A) NEGATIVE Final    Comment: RESULT CALLED TO, READ BACK BY AND VERIFIED WITH: Bethena Midget RN 14:10 01/05/19 ([email or web address]) (NOTE) If result is NEGATIVE SARS-CoV-2 target nucleic acids are  NOT DETECTED. The SARS-CoV-2 RNA is generally detectable in upper and lower  respiratory specimens during the acute phase of infection. The lowest  concentration of SARS-CoV-2 viral copies this assay can detect is 250  copies / mL. A negative result does not preclude SARS-CoV-2 infection  and should not be used as the sole basis for treatment or other  patient management decisions.  A negative result may occur with  improper specimen collection / handling, submission of specimen other  than nasopharyngeal swab, presence of viral mutation(s) within the  areas targeted by this assay, and inadequate number of viral copies  (<250 copies / mL). A negative result must be combined with clinical  observations, patient history, and epidemiological information. If result is POSITIVE SARS-CoV-2 target nucleic acids are DETECTED.  The SARS-CoV-2 RNA is generally detectable in upper and lower  respiratory specimens during the acute phase of infection.  Positive  results are indicative of active infection with SARS-CoV-2.  Clinical  correlation with patient history and other diagnostic information is  necessary to determine patient infection status.  Positive results do  not rule out bacterial infection or co-infection with other viruses.  If result is PRESUMPTIVE POSTIVE SARS-CoV-2 nucleic acids MAY BE PRESENT.   A presumptive positive result was obtained on the submitted specimen  and confirmed on repeat testing.  While 2019 novel coronavirus  (SARS-CoV-2) nucleic acids may be present in the submitted sample  additional confirmatory testing may be necessary for epidemiological  and / or clinical management purposes  to differentiate between  SARS-CoV-2 and other Sarbecovirus currently known to infect humans.  If clinically indicated additional testing with an alternate test  methodology [phone]) i s advised. The SARS-CoV-2 RNA is generally  detectable in upper and lower respiratory specimens  during the acute  phase of infection. The expected result is Negative. Fact Sheet for Patients:  StrictlyIdeas.no Fact Sheet for Healthcare Providers: BankingDealers.co.za This test is not yet approved or cleared by the [place] FDA and has been authorized for detection and/or diagnosis of SARS-CoV-2 by FDA under an Emergency Use Authorization (EUA).  This EUA will remain in effect (meaning this test can be used) for the duration of the COVID-19 declaration under Section 564(b)(1) of the Act, 21 U.S.C. section 360bbb-3(b)(1), unless the authorization is terminated or revoked sooner. Performed at [facility], [place] [address], [place], [place] [address]   Blood Culture (routine x 2)     Status: None   Collection Time: 01/05/19  1:00 PM   Specimen: BLOOD  Result Value Ref Range Status   Specimen Description BLOOD SITE NOT SPECIFIED  Final   Special Requests   Final    BOTTLES DRAWN AEROBIC AND ANAEROBIC Blood Culture adequate volume   Culture   Final    NO GROWTH 5 DAYS Performed at [facility], [address], [place], [place] [address]    Report Status 01/10/2019 FINAL  Final  Blood Culture (routine x 2)     Status: None   Collection Time: 01/05/19  1:00 PM   Specimen: BLOOD  Result Value Ref Range Status   Specimen Description BLOOD SITE NOT SPECIFIED  Final   Special Requests   Final    BOTTLES DRAWN AEROBIC ONLY Blood Culture results may not be optimal due to an inadequate volume of blood received in culture bottles   Culture   Final    NO GROWTH 5 DAYS Performed at [facility], [place] [address], [place], [place] [address]    Report Status 01/10/2019 FINAL  Final     Labs: BNP (last 3 results) No results for input(s): BNP in the last 8760 hours. Basic Metabolic Panel: Recent Labs  Lab 01/07/19 0106 01/07/19 1325 01/08/19 0720 01/09/19 0115 01/10/19 0505 01/11/19 0455  NA 131* 130* 133* 134*  135 134*  K 3.9 3.7 4.2 4.1 4.5 4.7  CL 102  --  100 101 102 100  CO2 20*  --  26 25 24 25   GLUCOSE 143*  --  121* 126* 121* 125*  BUN 21  --  22 27* 24* 27*  CREATININE 1.12  --  1.14 1.07 1.08 1.09  CALCIUM 8.2*  --  8.1* 8.3* 8.3* 8.5*   Liver Function Tests: Recent Labs  Lab 01/06/19 0709 01/07/19 0106 01/08/19 0720 01/09/19 0115 01/10/19 0505  AST 107* 82* 46* 43* 28  ALT 74* 71* 52* 49* 39  ALKPHOS 62 62 55 56 51  BILITOT 1.0 0.7 0.5 0.3 0.5  PROT 7.4 7.0 6.7 6.8 6.3*  ALBUMIN 3.0* 2.9* 2.6* 2.8* 2.6*   No results for input(s): LIPASE, AMYLASE in the last  168 hours. No results for input(s): AMMONIA in the last 168 hours. CBC: Recent Labs  Lab 01/05/19 1146  01/06/19 0709 01/07/19 0106 01/07/19 1325 01/08/19 0720 01/09/19 0115 01/10/19 0505  WBC 8.6   < > 7.9 6.1  --  3.2* 3.9* 4.3  NEUTROABS 7.8*  --   --   --   --   --   --   --   HGB 14.5   < > 12.9* 12.9* 14.6 13.4 14.0 12.9*  HCT 42.9   < > 39.4 38.7* 43.0 40.2 44.2 40.7  MCV 90.5   < > 90.0 89.2  --  90.7 92.9 91.5  PLT 145*   < > 152 164  --  179 174 232   < > = values in this interval not displayed.   Cardiac Enzymes: No results for input(s): CKTOTAL, CKMB, CKMBINDEX, TROPONINI in the last 168 hours. BNP: Invalid input(s): POCBNP CBG: Recent Labs  Lab 01/10/19 1144 01/10/19 1601 01/10/19 2132 01/11/19 0743 01/11/19 1159  GLUCAP 110* 82 146* 113* 111*   D-Dimer Recent Labs    01/10/19 0505 01/11/19 0455  DDIMER 0.85* 1.27*   Hgb A1c No results for input(s): HGBA1C in the last 72 hours. Lipid Profile No results for input(s): CHOL, HDL, LDLCALC, TRIG, CHOLHDL, LDLDIRECT in the last 72 hours. Thyroid function studies No results for input(s): TSH, T4TOTAL, T3FREE, THYROIDAB in the last 72 hours.  Invalid input(s): FREET3 Anemia work up Recent Labs    01/09/19 0115 01/10/19 0505  FERRITIN 1,296* 915*   Urinalysis    Component Value Date/Time   COLORURINE AMBER (A) 01/02/2019  1252   APPEARANCEUR HAZY (A) 01/02/2019 1252   LABSPEC 1.025 01/02/2019 1252   PHURINE 5.0 01/02/2019 1252   [place] 01/02/2019 1252   [place] 01/02/2019 1252   [place] 01/02/2019 1252   KETONESUR 5 (A) 01/02/2019 1252   PROTEINUR 30 (A) 01/02/2019 1252   NITRITE NEGATIVE 01/02/2019 1252   LEUKOCYTESUR NEGATIVE 01/02/2019 1252   Sepsis Labs Invalid input(s): PROCALCITONIN,  WBC,  LACTICIDVEN  FURTHER DISCHARGE INSTRUCTIONS:   Get Medicines reviewed and adjusted: Please take all your medications with you for your next visit with your Primary MD   Laboratory/radiological data: Please request your Primary MD to go over all hospital tests and procedure/radiological results at the follow up, please ask your Primary MD to get all Hospital records sent to his/her office.   In some cases, they will be blood work, cultures and biopsy results pending at the time of your discharge. Please request that your primary care M.D. goes through all the records of your hospital data and follows up on these results.   Also Note the following: If you experience worsening of your admission symptoms, develop shortness of breath, life threatening emergency, suicidal or homicidal thoughts you must seek medical attention immediately by calling 911 or calling your MD immediately  if symptoms less severe.   You must read complete instructions/literature along with all the possible adverse reactions/side effects for all the Medicines you take and that have been prescribed to you. Take any new Medicines after you have completely understood and accpet all the possible adverse reactions/side effects.    Do not drive when taking Pain medications or sleeping medications (Benzodaizepines)   Do not take more than prescribed Pain, Sleep and Anxiety Medications. It is not advisable to combine anxiety,sleep and pain medications without talking with your primary care practitioner   Special  Instructions: If you  have smoked or chewed Tobacco  in the last 2 yrs please stop smoking, stop any regular Alcohol  and or any Recreational drug use.   Wear Seat belts while driving.   Please note: You were cared for by a hospitalist during your hospital stay. Once you are discharged, your primary care physician will handle any further medical issues. Please note that NO REFILLS for any discharge medications will be authorized once you are discharged, as it is imperative that you return to your primary care physician (or establish a relationship with a primary care physician if you do not have one) for your post hospital discharge needs so that they can reassess your need for medications and monitor your lab values.  Time coordinating discharge: 40 minutes  SIGNED:  Marzetta Board, MD, PhD 01/11/2019, 12:45 PM

## 2019-01-11 NOTE — Progress Notes (Signed)
Occupational Therapy Treatment Patient Details Name: Larry Richmond MRN: [id number] DOB: 1950/10/20 Today's Date: 01/11/2019    History of present illness ZETH BUDAY is a 68 y.o. male admitted on 01/05/2019 with COVID-19 now with concern for superimposed bacterial pneumonia. History of DM, thyrod surgery   OT comments  Pt has made excellent progress. REviewed energy conservation strategies. Pt states he gets "fatigued" easily. Recommend rollator to assist with increasing safety and independence with ADL and functional mobility for ADL.   Follow Up Recommendations  No OT follow up;Supervision - Intermittent    Equipment Recommendations  Other (comment)(rollator - wide)    Recommendations for Other Services      Precautions / Restrictions Precautions Precautions: Fall       Mobility Bed Mobility  - OOB in chair                  Transfers      modified independent                Balance                                           ADL either performed or assessed with clinical judgement   ADL                                         General ADL Comments: Reviewed energy conservation strategies, , increasing activity tolerance and use of rollator for longer distances. Pt verblaized understanidng. Educated on reducing risk of falls. Recommenduse of shower chair when bathing - pt verbalized understnaidng.      Vision       Quarry manager                                                Exercises Other Exercises Other Exercises: Pt completing incentive spirometer and flutter valve independently Other Exercises: Copmleting therband exercises independently  - per report   Shoulder Instructions       General Comments      Pertinent Vitals/ Pain          Home Living                                          Prior Functioning/Environment               Frequency           Progress Toward Goals  OT Goals(current goals can now be found in the care plan section)  Progress towards OT goals: Goals met/education completed, patient discharged from OT  Acute Rehab OT Goals Patient Stated Goal: to go home OT Goal Formulation: With patient Time For Goal Achievement: 01/24/19 Potential to Achieve Goals: Good ADL Goals Pt Will Perform Lower Body Bathing: with modified independence;sit to/from stand Pt Will Perform Lower Body Dressing: with modified independence;sit to/from stand Pt Will Transfer to Toilet: with modified independence;ambulating Additional ADL Goal #1: Pt will comlete ADL task with SpO2 remaining  above 88 using pursed lip breathing and energy conservation strategies Additional ADL Goal #2: Pt will verbalize 3 strategies to reduce risk of falls  Plan All goals met and education completed, patient discharged from OT services    Co-evaluation                 AM-PAC OT "6 Clicks" Daily Activity     Outcome Measure   Help from another person eating meals?: None Help from another person taking care of personal grooming?: None Help from another person toileting, which includes using toliet, bedpan, or urinal?: A Little Help from another person bathing (including washing, rinsing, drying)?: A Little Help from another person to put on and taking off regular upper body clothing?: A Little Help from another person to put on and taking off regular lower body clothing?: A Little 6 Click Score: 20    End of Session    OT Visit Diagnosis: Unsteadiness on feet (R26.81);Muscle weakness (generalized) (M62.81)   Activity Tolerance Patient tolerated treatment well   Patient Left in chair;with call bell/phone within reach   Nurse Communication          Time: 7654-6503 OT Time Calculation (min): 15 min  Charges: OT General Charges $OT Visit: 1 Visit OT Treatments $Self Care/Home Management : 8-22 mins  Maurie Boettcher, OT/L   Acute OT Clinical Specialist [place] Pager [phone] Office [phone]    [facility] 01/11/2019, 4:51 PM

## 2019-01-11 NOTE — Discharge Instructions (Signed)
Follow with Primary care doctor in 2-3 weeks  Please get a complete blood count and chemistry panel checked by your Primary MD at your next visit, and again as instructed by your Primary MD. Please get your medications reviewed and adjusted by your Primary MD.  Please request your Primary MD to go over all Hospital Tests and Procedure/Radiological results at the follow up, please get all Hospital records sent to your Prim MD by signing hospital release before you go home.  In some cases, there will be blood work, cultures and biopsy results pending at the time of your discharge. Please request that your primary care M.D. goes through all the records of your hospital data and follows up on these results.  If you had Pneumonia of Lung problems at the Hospital: Please get a 2 view Chest X ray done in 6-8 weeks after hospital discharge or sooner if instructed by your Primary MD.  If you have Congestive Heart Failure: Please call your Cardiologist or Primary MD anytime you have any of the following symptoms:  1) 3 pound weight gain in 24 hours or 5 pounds in 1 week  2) shortness of breath, with or without a dry hacking cough  3) swelling in the hands, feet or stomach  4) if you have to sleep on extra pillows at night in order to breathe  Follow cardiac low salt diet and 1.5 lit/day fluid restriction.  If you have diabetes Accuchecks 4 times/day, Once in AM empty stomach and then before each meal. Log in all results and show them to your primary doctor at your next visit. If any glucose reading is under 80 or above 300 call your primary MD immediately.  If you have Seizure/Convulsions/Epilepsy: Please do not drive, operate heavy machinery, participate in activities at heights or participate in high speed sports until you have seen by Primary MD or a Neurologist and advised to do so again. Per [facility] statutes, patients with seizures are not allowed to drive until they have been  seizure-free for six months.  Use caution when using heavy equipment or power tools. Avoid working on ladders or at heights. Take showers instead of baths. Ensure the water temperature is not too high on the home water heater. Do not go swimming alone. Do not lock yourself in a room alone (i.e. bathroom). When caring for infants or small children, sit down when holding, feeding, or changing them to minimize risk of injury to the child in the event you have a seizure. Maintain good sleep hygiene. Avoid alcohol.   If you had Gastrointestinal Bleeding: Please ask your Primary MD to check a complete blood count within one week of discharge or at your next visit. Your endoscopic/colonoscopic biopsies that are pending at the time of discharge, will also need to followed by your Primary MD.  Get Medicines reviewed and adjusted. Please take all your medications with you for your next visit with your Primary MD  Please request your Primary MD to go over all hospital tests and procedure/radiological results at the follow up, please ask your Primary MD to get all Hospital records sent to his/her office.  If you experience worsening of your admission symptoms, develop shortness of breath, life threatening emergency, suicidal or homicidal thoughts you must seek medical attention immediately by calling 911 or calling your MD immediately  if symptoms less severe.  You must read complete instructions/literature along with all the possible adverse reactions/side effects for all the Medicines you take  and that have been prescribed to you. Take any new Medicines after you have completely understood and accpet all the possible adverse reactions/side effects.   Do not drive or operate heavy machinery when taking Pain medications.   Do not take more than prescribed Pain, Sleep and Anxiety Medications  Special Instructions: If you have smoked or chewed Tobacco  in the last 2 yrs please stop smoking, stop any regular  Alcohol  and or any Recreational drug use.  Wear Seat belts while driving.  Please note You were cared for by a hospitalist during your hospital stay. If you have any questions about your discharge medications or the care you received while you were in the hospital after you are discharged, you can call the unit and asked to speak with the hospitalist on call if the hospitalist that took care of you is not available. Once you are discharged, your primary care physician will handle any further medical issues. Please note that NO REFILLS for any discharge medications will be authorized once you are discharged, as it is imperative that you return to your primary care physician (or establish a relationship with a primary care physician if you do not have one) for your aftercare needs so that they can reassess your need for medications and monitor your lab values.  You can reach the hospitalist office at phone [phone] or fax [phone]   If you do not have a primary care physician, you can call [phone] for a physician referral.  Activity: As tolerated with Full fall precautions use walker/cane & assistance as needed    Diet: regular  Disposition Home      Person Under Monitoring Name: Larry Richmond  Location: [address] [place] [address]   Infection Prevention Recommendations for Individuals Confirmed to have, or Being Evaluated for, 2019 Novel Coronavirus (COVID-19) Infection Who Receive Care at Home  Individuals who are confirmed to have, or are being evaluated for, COVID-19 should follow the prevention steps below until a healthcare provider or local or state health department says they can return to normal activities.  Stay home except to get medical care You should restrict activities outside your home, except for getting medical care. Do not go to work, school, or public areas, and do not use public transportation or taxis.  Call ahead before visiting your  doctor Before your medical appointment, call the healthcare provider and tell them that you have, or are being evaluated for, COVID-19 infection. This will help the healthcare providers office take steps to keep other people from getting infected. Ask your healthcare provider to call the local or state health department.  Monitor your symptoms Seek prompt medical attention if your illness is worsening (e.g., difficulty breathing). Before going to your medical appointment, call the healthcare provider and tell them that you have, or are being evaluated for, COVID-19 infection. Ask your healthcare provider to call the local or state health department.  Wear a facemask You should wear a facemask that covers your nose and mouth when you are in the same room with other people and when you visit a healthcare provider. People who live with or visit you should also wear a facemask while they are in the same room with you.  Separate yourself from other people in your home As much as possible, you should stay in a different room from other people in your home. Also, you should use a separate bathroom, if available.  Avoid sharing household items You should not  share dishes, drinking glasses, cups, eating utensils, towels, bedding, or other items with other people in your home. After using these items, you should wash them thoroughly with soap and water.  Cover your coughs and sneezes Cover your mouth and nose with a tissue when you cough or sneeze, or you can cough or sneeze into your sleeve. Throw used tissues in a lined trash can, and immediately wash your hands with soap and water for at least 20 seconds or use an alcohol-based hand rub.  Wash your [facility] your hands often and thoroughly with soap and water for at least 20 seconds. You can use an alcohol-based hand sanitizer if soap and water are not available and if your hands are not visibly dirty. Avoid touching your eyes, nose,  and mouth with unwashed hands.   Prevention Steps for Caregivers and Household Members of Individuals Confirmed to have, or Being Evaluated for, COVID-19 Infection Being Cared for in the Home  If you live with, or provide care at home for, a person confirmed to have, or being evaluated for, COVID-19 infection please follow these guidelines to prevent infection:  Follow healthcare providers instructions Make sure that you understand and can help the patient follow any healthcare provider instructions for all care.  Provide for the patients basic needs You should help the patient with basic needs in the home and provide support for getting groceries, prescriptions, and other personal needs.  Monitor the patients symptoms If they are getting sicker, call his or her medical provider and tell them that the patient has, or is being evaluated for, COVID-19 infection. This will help the healthcare providers office take steps to keep other people from getting infected. Ask the healthcare provider to call the local or state health department.  Limit the number of people who have contact with the patient  If possible, have only one caregiver for the patient.  Other household members should stay in another home or place of residence. If this is not possible, they should stay  in another room, or be separated from the patient as much as possible. Use a separate bathroom, if available.  Restrict visitors who do not have an essential need to be in the home.  Keep older adults, very young children, and other sick people away from the patient Keep older adults, very young children, and those who have compromised immune systems or chronic health conditions away from the patient. This includes people with chronic heart, lung, or kidney conditions, diabetes, and cancer.  Ensure good ventilation Make sure that shared spaces in the home have good air flow, such as from an air conditioner or an  opened window, weather permitting.  Wash your hands often  Wash your hands often and thoroughly with soap and water for at least 20 seconds. You can use an alcohol based hand sanitizer if soap and water are not available and if your hands are not visibly dirty.  Avoid touching your eyes, nose, and mouth with unwashed hands.  Use disposable paper towels to dry your hands. If not available, use dedicated cloth towels and replace them when they become wet.  Wear a facemask and gloves  Wear a disposable facemask at all times in the room and gloves when you touch or have contact with the patients blood, body fluids, and/or secretions or excretions, such as sweat, saliva, sputum, nasal mucus, vomit, urine, or feces.  Ensure the mask fits over your nose and mouth tightly, and do not touch it  during use.  Throw out disposable facemasks and gloves after using them. Do not reuse.  Wash your hands immediately after removing your facemask and gloves.  If your personal clothing becomes contaminated, carefully remove clothing and launder. Wash your hands after handling contaminated clothing.  Place all used disposable facemasks, gloves, and other waste in a lined container before disposing them with other household waste.  Remove gloves and wash your hands immediately after handling these items.  Do not share dishes, glasses, or other household items with the patient  Avoid sharing household items. You should not share dishes, drinking glasses, cups, eating utensils, towels, bedding, or other items with a patient who is confirmed to have, or being evaluated for, COVID-19 infection.  After the person uses these items, you should wash them thoroughly with soap and water.  Wash laundry thoroughly  Immediately remove and wash clothes or bedding that have blood, body fluids, and/or secretions or excretions, such as sweat, saliva, sputum, nasal mucus, vomit, urine, or feces, on them.  Wear gloves  when handling laundry from the patient.  Read and follow directions on labels of laundry or clothing items and detergent. In general, wash and dry with the warmest temperatures recommended on the label.  Clean all areas the individual has used often  Clean all touchable surfaces, such as counters, tabletops, doorknobs, bathroom fixtures, toilets, phones, keyboards, tablets, and bedside tables, every day. Also, clean any surfaces that may have blood, body fluids, and/or secretions or excretions on them.  Wear gloves when cleaning surfaces the patient has come in contact with.  Use a diluted bleach solution (e.g., dilute bleach with 1 part bleach and 10 parts water) or a household disinfectant with a label that says EPA-registered for coronaviruses. To make a bleach solution at home, add 1 tablespoon of bleach to 1 quart (4 cups) of water. For a larger supply, add  cup of bleach to 1 gallon (16 cups) of water.  Read labels of cleaning products and follow recommendations provided on product labels. Labels contain instructions for safe and effective use of the cleaning product including precautions you should take when applying the product, such as wearing gloves or eye protection and making sure you have good ventilation during use of the product.  Remove gloves and wash hands immediately after cleaning.  Monitor yourself for signs and symptoms of illness Caregivers and household members are considered close contacts, should monitor their health, and will be asked to limit movement outside of the home to the extent possible. Follow the monitoring steps for close contacts listed on the symptom monitoring form.   ? If you have additional questions, contact your local health department or call the epidemiologist on call at [phone] (available 24/7). ? This guidance is subject to change. For the most up-to-date guidance from CDC, please refer to their  website: YouBlogs.pl  People who have recovered from the coronavirus, can help a person still fighting the virus and potentially help them recover by giving them convalescent plasma.  What is COVID-19 convalescent plasma? Convalescent plasma (CPP) is plasma collected from people who have recovered from the coronavirus. People who recover from coronavirus infection have developed antibodies to the virus that remain in the plasma portion of their blood. Transfusing the plasma that contains the antibodies into a person still fighting the virus can provide a boost to the patients immune system and potentially help them recover. The experimental treatment is approved by the FDA to be used on an emergency basis  and is called COVID-19 convalescent plasma." Critically ill patients who meet the FDA criteria to receive this therapy can be treated for life-threatening COVID-19.  Can I donate convalescent plasma or blood after being diagnosed with COVID-19? Donors must meet all the required screening criteria for blood donation and the additional FDA criteria, as follows:   Prior diagnosis of COVID-19 documented by an FDA approved laboratory test   A positive diagnostic test at the time of illness OR   A positive serological test for SARS-CoV-2 antibodies after recovery  Complete resolution of symptoms at least 14 days prior to donation and a documented negative COVID-19 FDA approved test OR   Complete resolution of symptoms at least 28 days prior to donation As a part of your pre-donation process you will be required to provide your COVID-19 test result(s).  Where can I donate COVID-19 convalescent plasma? You can complete the pre-donation form at [email or web address] and a donor specialist will be in contact within 24-48 hours.  What is the process for donating COVID-19 Convalescent Plasma?  1. The first step in the donation process  is to obtain a copy of your test results or a letter from the testing facility notifying you of your positive result and the date it was taken 2. Complete the pre-donation form at [email or web address] 3. A representative will be in contact within 24-48 hours to provide additional insight into the process 4. Once the process begins, [facility] will ensure you   Meet the required screening criteria for blood donation   Have the required test results   Meet the criteria for resolution of your symptoms   Complete resolution of symptoms at least 14 days prior to donation and a documented negative COVID-19 FDA approved test OR   Complete resolution of symptoms at least 28 days prior to donation 5. [facility] will then schedule a collection date and time to collect your COVID-19 convalescent plasma  Is the COVID-19 convalescent plasma donation safe? It is safe to donate COVID-19 convalescent plasma and done in the same way that thousands of blood donors donate blood products every day.  My family member or friend is being treated for COVID-19. Can I donate to help them if I am eligible? A directed donation will need to be arranged in conjunction with your family members care provider.  The care provider will determine whether COVID-19 convalescent plasma is the best course of treatment   A directed donation must be arranged through the Clinician Referral page that can be found at [email or web address]   The clinician will need to provide [facility] with information about both you and the patient for which the donation is directed   Please note there are additional qualifications that are required such as your blood type and the patients blood type We encourage you to consider beginning the pre-registration process even if your loved one will not be receiving COVID-19 convalescent plasma as a part of their medical treatment. The need for COVID-19 convalescent plasma increases daily and your donated  COVID-19 convalescent plasma could be life-saving for another COVID-19 patient.  Please go to the website [email or web address] if you would like to consider volunteer for plasma donation.

## 2019-01-11 NOTE — TOC Transition Note (Signed)
Transition of Care [facility]) - CM/SW Discharge Note   Patient Details  Name: Larry Richmond MRN: [id number] Date of Birth: Feb 22, 1951  [facility] CM/SW Contact:  Ninfa Meeker, RN Phone Number: 01/11/2019, 1:51 PM   Clinical Narrative:  Patient is a 68 y.o.malewith medical history significant ofHTN, thyroidectomy for goiter, GERD, prediabetes who presented for SOB X 1 day. Mr. Sullo reports that he began to have abdominal pain, diarrhea and nausea about 5 days ago. He presented to the ED on 10/20 and had a CT scan of his abdomen which did not show any acute pathology, some chronic changes only.Within the last day, however, he developed SOB and hypoxia on room air.He also had a fever of 102F prior to admission. Patient has improved after treatment and will discharge home. Case manager requested that bariatric rollator be delivered to patient's home. Huey Romans was contacted.         Final next level of care: Home/Self Care Barriers to Discharge: No Barriers Identified   Patient Goals and CMS Choice     Choice offered to / list presented to : Patient  Discharge Placement                       Discharge Plan and Services     Post Acute Care Choice: Durable Medical Equipment          DME Arranged: Walker rolling with seat, [place]ford Rile wide DME Agency: [place] Date DME Agency Contacted: 01/11/19 Time DME Agency Contacted: [phone] Representative spoke with at [place]: Learta Codding   [place]: NA        Social Determinants of Health ([place]) Interventions     Readmission Risk Interventions No flowsheet data found.

## 2019-01-11 NOTE — Plan of Care (Signed)

## 2019-01-12 ENCOUNTER — Inpatient Hospital Stay (HOSPITAL_COMMUNITY)
Admission: EM | Admit: 2019-01-12 | Discharge: 2019-01-19 | DRG: 177 | Disposition: A | Discharge status: Home / Self Care | Payer: Medicare Other | Attending: Internal Medicine | Admitting: Internal Medicine

## 2019-01-12 ENCOUNTER — Other Ambulatory Visit: Payer: Self-pay

## 2019-01-12 ENCOUNTER — Encounter (HOSPITAL_COMMUNITY): Payer: Self-pay | Admitting: Emergency Medicine

## 2019-01-12 ENCOUNTER — Emergency Department (HOSPITAL_COMMUNITY): Payer: Medicare Other

## 2019-01-12 DIAGNOSIS — E890 Postprocedural hypothyroidism: Secondary | ICD-10-CM | POA: Diagnosis present

## 2019-01-12 DIAGNOSIS — F1290 Cannabis use, unspecified, uncomplicated: Secondary | ICD-10-CM | POA: Diagnosis present

## 2019-01-12 DIAGNOSIS — Z6841 Body Mass Index (BMI) 40.0 and over, adult: Secondary | ICD-10-CM

## 2019-01-12 DIAGNOSIS — J440 Chronic obstructive pulmonary disease with acute lower respiratory infection: Secondary | ICD-10-CM | POA: Diagnosis present

## 2019-01-12 DIAGNOSIS — E872 Acidosis, unspecified: Secondary | ICD-10-CM | POA: Diagnosis present

## 2019-01-12 DIAGNOSIS — J9621 Acute and chronic respiratory failure with hypoxia: Secondary | ICD-10-CM | POA: Diagnosis not present

## 2019-01-12 DIAGNOSIS — Z713 Dietary counseling and surveillance: Secondary | ICD-10-CM | POA: Diagnosis not present

## 2019-01-12 DIAGNOSIS — U071 COVID-19: Principal | ICD-10-CM | POA: Diagnosis present

## 2019-01-12 DIAGNOSIS — I10 Essential (primary) hypertension: Secondary | ICD-10-CM | POA: Diagnosis present

## 2019-01-12 DIAGNOSIS — J9601 Acute respiratory failure with hypoxia: Secondary | ICD-10-CM | POA: Diagnosis present

## 2019-01-12 DIAGNOSIS — Z720 Tobacco use: Secondary | ICD-10-CM

## 2019-01-12 DIAGNOSIS — E118 Type 2 diabetes mellitus with unspecified complications: Secondary | ICD-10-CM

## 2019-01-12 DIAGNOSIS — I2699 Other pulmonary embolism without acute cor pulmonale: Secondary | ICD-10-CM | POA: Diagnosis present

## 2019-01-12 DIAGNOSIS — Z9119 Patient's noncompliance with other medical treatment and regimen: Secondary | ICD-10-CM

## 2019-01-12 DIAGNOSIS — K219 Gastro-esophageal reflux disease without esophagitis: Secondary | ICD-10-CM | POA: Diagnosis present

## 2019-01-12 DIAGNOSIS — E1121 Type 2 diabetes mellitus with diabetic nephropathy: Secondary | ICD-10-CM | POA: Diagnosis not present

## 2019-01-12 DIAGNOSIS — J431 Panlobular emphysema: Secondary | ICD-10-CM | POA: Diagnosis not present

## 2019-01-12 DIAGNOSIS — E119 Type 2 diabetes mellitus without complications: Secondary | ICD-10-CM | POA: Diagnosis present

## 2019-01-12 DIAGNOSIS — J189 Pneumonia, unspecified organism: Principal | ICD-10-CM

## 2019-01-12 DIAGNOSIS — E785 Hyperlipidemia, unspecified: Secondary | ICD-10-CM | POA: Diagnosis present

## 2019-01-12 DIAGNOSIS — J1282 Pneumonia due to coronavirus disease 2019: Secondary | ICD-10-CM | POA: Diagnosis present

## 2019-01-12 DIAGNOSIS — M7989 Other specified soft tissue disorders: Secondary | ICD-10-CM | POA: Diagnosis not present

## 2019-01-12 DIAGNOSIS — Z716 Tobacco abuse counseling: Secondary | ICD-10-CM

## 2019-01-12 DIAGNOSIS — Z79890 Hormone replacement therapy (postmenopausal): Secondary | ICD-10-CM | POA: Diagnosis not present

## 2019-01-12 DIAGNOSIS — E039 Hypothyroidism, unspecified: Secondary | ICD-10-CM | POA: Diagnosis present

## 2019-01-12 DIAGNOSIS — G4733 Obstructive sleep apnea (adult) (pediatric): Secondary | ICD-10-CM | POA: Diagnosis present

## 2019-01-12 DIAGNOSIS — Z91199 Patient's noncompliance with other medical treatment and regimen due to unspecified reason: Secondary | ICD-10-CM

## 2019-01-12 DIAGNOSIS — E7800 Pure hypercholesterolemia, unspecified: Secondary | ICD-10-CM | POA: Diagnosis not present

## 2019-01-12 DIAGNOSIS — F17200 Nicotine dependence, unspecified, uncomplicated: Secondary | ICD-10-CM | POA: Diagnosis not present

## 2019-01-12 DIAGNOSIS — Z7984 Long term (current) use of oral hypoglycemic drugs: Secondary | ICD-10-CM

## 2019-01-12 DIAGNOSIS — I2694 Multiple subsegmental pulmonary emboli without acute cor pulmonale: Secondary | ICD-10-CM | POA: Diagnosis not present

## 2019-01-12 DIAGNOSIS — J1289 Other viral pneumonia: Secondary | ICD-10-CM | POA: Diagnosis present

## 2019-01-12 DIAGNOSIS — J449 Chronic obstructive pulmonary disease, unspecified: Secondary | ICD-10-CM | POA: Diagnosis present

## 2019-01-12 DIAGNOSIS — E875 Hyperkalemia: Secondary | ICD-10-CM | POA: Diagnosis present

## 2019-01-12 DIAGNOSIS — Z9989 Dependence on other enabling machines and devices: Secondary | ICD-10-CM

## 2019-01-12 LAB — COMPREHENSIVE METABOLIC PANEL
ALT: 55 U/L — ABNORMAL HIGH (ref 0–44)
AST: 45 U/L — ABNORMAL HIGH (ref 15–41)
Albumin: 2.7 g/dL — ABNORMAL LOW (ref 3.5–5.0)
Alkaline Phosphatase: 52 U/L (ref 38–126)
Anion gap: 11 (ref 5–15)
BUN: 21 mg/dL (ref 8–23)
CO2: 23 mmol/L (ref 22–32)
Calcium: 8.4 mg/dL — ABNORMAL LOW (ref 8.9–10.3)
Chloride: 104 mmol/L (ref 98–111)
Creatinine, Ser: 1.13 mg/dL (ref 0.61–1.24)
GFR calc Af Amer: >60 mL/min (ref >60)
GFR calc non Af Amer: >60 mL/min (ref >60)
Glucose, Bld: 89 mg/dL (ref 70–99)
Potassium: 4.2 mmol/L (ref 3.5–5.1)
Sodium: 138 mmol/L (ref 135–145)
Total Bilirubin: 0.5 mg/dL (ref 0.3–1.2)
Total Protein: 6.4 g/dL — ABNORMAL LOW (ref 6.5–8.1)

## 2019-01-12 LAB — CBC WITH DIFFERENTIAL/PLATELET
Abs Immature Granulocytes: 0.34 10*3/uL — ABNORMAL HIGH (ref 0.00–0.07)
Basophils Absolute: 0 10*3/uL (ref 0.0–0.1)
Basophils Relative: 0 %
Eosinophils Absolute: 0.2 10*3/uL (ref 0.0–0.5)
Eosinophils Relative: 3 %
HCT: 44.8 % (ref 39.0–52.0)
Hemoglobin: 14.9 g/dL (ref 13.0–17.0)
Immature Granulocytes: 5 %
Lymphocytes Relative: 11 %
Lymphs Abs: 0.8 10*3/uL (ref 0.7–4.0)
MCH: 29.9 pg (ref 26.0–34.0)
MCHC: 33.3 g/dL (ref 30.0–36.0)
MCV: 89.8 fL (ref 80.0–100.0)
Monocytes Absolute: 0.5 10*3/uL (ref 0.1–1.0)
Monocytes Relative: 6 %
Neutro Abs: 5.6 10*3/uL (ref 1.7–7.7)
Neutrophils Relative %: 75 %
Platelets: 213 10*3/uL (ref 150–400)
RBC: 4.99 MIL/uL (ref 4.22–5.81)
RDW: 14.8 % (ref 11.5–15.5)
WBC: 7.4 10*3/uL (ref 4.0–10.5)
nRBC: 0 % (ref 0.0–0.2)

## 2019-01-12 LAB — LACTATE DEHYDROGENASE: LDH: 359 U/L — ABNORMAL HIGH (ref 98–192)

## 2019-01-12 LAB — TRIGLYCERIDES: Triglycerides: 153 mg/dL — ABNORMAL HIGH (ref <150)

## 2019-01-12 LAB — FERRITIN: Ferritin: 910 ng/mL — ABNORMAL HIGH (ref 24–336)

## 2019-01-12 LAB — LACTIC ACID, PLASMA: Lactic Acid, Venous: 2.1 mmol/L (ref 0.5–1.9)

## 2019-01-12 LAB — FIBRINOGEN: Fibrinogen: 499 mg/dL — ABNORMAL HIGH (ref 210–475)

## 2019-01-12 LAB — D-DIMER, QUANTITATIVE: D-Dimer, Quant: 17.54 ug/mL-FEU — ABNORMAL HIGH (ref 0.00–0.50)

## 2019-01-12 LAB — CBG MONITORING, ED: Glucose-Capillary: 71 mg/dL (ref 70–99)

## 2019-01-12 LAB — PROCALCITONIN: Procalcitonin: <0.1 ng/mL

## 2019-01-12 LAB — C-REACTIVE PROTEIN: CRP: <0.8 mg/dL (ref <1.0)

## 2019-01-12 MED ORDER — GUAIFENESIN-DM 100-10 MG/5ML PO SYRP: 10.0000 mL | ORAL_SOLUTION | ORAL | Status: DC | PRN

## 2019-01-12 MED ORDER — VANCOMYCIN HCL IN DEXTROSE 1-5 GM/200ML-% IV SOLN
1000.0000 mg | Freq: Once | INTRAVENOUS | Status: AC
  Administered 2019-01-12: 14:00:00 1000 mg via INTRAVENOUS
  Filled 2019-01-12: qty 200

## 2019-01-12 MED ORDER — ALBUTEROL SULFATE HFA 108 (90 BASE) MCG/ACT IN AERS
2.0000 | INHALATION_SPRAY | Freq: Four times a day (QID) | RESPIRATORY_TRACT | Status: DC
  Administered 2019-01-12 – 2019-01-19 (×25): 2 via RESPIRATORY_TRACT
  Filled 2019-01-12 (×2): qty 6.7

## 2019-01-12 MED ORDER — ATORVASTATIN CALCIUM 40 MG PO TABS
40.0000 mg | ORAL_TABLET | Freq: Every day | ORAL | Status: DC
  Administered 2019-01-12 – 2019-01-14 (×3): 40 mg via ORAL
  Filled 2019-01-12 (×3): qty 1

## 2019-01-12 MED ORDER — FAMOTIDINE 20 MG PO TABS
20.0000 mg | ORAL_TABLET | Freq: Two times a day (BID) | ORAL | Status: DC
  Administered 2019-01-12 – 2019-01-19 (×14): 20 mg via ORAL
  Filled 2019-01-12 (×14): qty 1

## 2019-01-12 MED ORDER — ACETAMINOPHEN 500 MG PO TABS
1000.0000 mg | ORAL_TABLET | Freq: Once | ORAL | Status: AC
  Administered 2019-01-12: 14:00:00 1000 mg via ORAL
  Filled 2019-01-12: qty 2

## 2019-01-12 MED ORDER — METOCLOPRAMIDE HCL 5 MG/ML IJ SOLN
10.0000 mg | Freq: Once | INTRAMUSCULAR | Status: AC
  Administered 2019-01-12: 10 mg via INTRAVENOUS
  Filled 2019-01-12: qty 2

## 2019-01-12 MED ORDER — ONDANSETRON HCL 4 MG PO TABS: 4.0000 mg | ORAL_TABLET | Freq: Four times a day (QID) | ORAL | Status: DC | PRN

## 2019-01-12 MED ORDER — DIPHENHYDRAMINE HCL 50 MG/ML IJ SOLN
12.5000 mg | Freq: Once | INTRAMUSCULAR | Status: DC
  Filled 2019-01-12: qty 1

## 2019-01-12 MED ORDER — ENOXAPARIN SODIUM 40 MG/0.4ML ~~LOC~~ SOLN
40.0000 mg | SUBCUTANEOUS | Status: DC
  Administered 2019-01-12: 20:00:00 40 mg via SUBCUTANEOUS
  Filled 2019-01-12: qty 0.4

## 2019-01-12 MED ORDER — SODIUM CHLORIDE 0.9% FLUSH
3.0000 mL | Freq: Two times a day (BID) | INTRAVENOUS | Status: DC
  Administered 2019-01-12 – 2019-01-19 (×14): 3 mL via INTRAVENOUS

## 2019-01-12 MED ORDER — ACETAMINOPHEN 325 MG PO TABS: 650.0000 mg | ORAL_TABLET | Freq: Four times a day (QID) | ORAL | Status: DC | PRN

## 2019-01-12 MED ORDER — ZINC SULFATE 220 (50 ZN) MG PO CAPS
220.0000 mg | ORAL_CAPSULE | Freq: Every day | ORAL | Status: DC
  Administered 2019-01-12 – 2019-01-19 (×8): 220 mg via ORAL
  Filled 2019-01-12 (×8): qty 1

## 2019-01-12 MED ORDER — INSULIN ASPART 100 UNIT/ML ~~LOC~~ SOLN
0.0000 [IU] | Freq: Three times a day (TID) | SUBCUTANEOUS | Status: DC
  Administered 2019-01-13: 1 [IU] via SUBCUTANEOUS
  Administered 2019-01-13: 09:00:00 2 [IU] via SUBCUTANEOUS

## 2019-01-12 MED ORDER — SODIUM CHLORIDE 0.9 % IV SOLN
2.0000 g | Freq: Once | INTRAVENOUS | Status: AC
  Administered 2019-01-12: 2 g via INTRAVENOUS
  Filled 2019-01-12: qty 2

## 2019-01-12 MED ORDER — ONDANSETRON HCL 4 MG/2ML IJ SOLN: 4.0000 mg | Freq: Four times a day (QID) | INTRAMUSCULAR | Status: DC | PRN

## 2019-01-12 MED ORDER — DIPHENHYDRAMINE HCL 50 MG/ML IJ SOLN
25.0000 mg | Freq: Once | INTRAMUSCULAR | Status: AC
  Administered 2019-01-12: 14:00:00 25 mg via INTRAVENOUS

## 2019-01-12 MED ORDER — HYDROCOD POLST-CPM POLST ER 10-8 MG/5ML PO SUER
5.0000 mL | Freq: Two times a day (BID) | ORAL | Status: DC | PRN
  Administered 2019-01-13: 5 mL via ORAL
  Filled 2019-01-12: qty 5

## 2019-01-12 MED ORDER — DEXAMETHASONE 6 MG PO TABS
6.0000 mg | ORAL_TABLET | ORAL | Status: DC
  Administered 2019-01-12 – 2019-01-18 (×7): 6 mg via ORAL
  Filled 2019-01-12: qty 1
  Filled 2019-01-12: qty 2
  Filled 2019-01-12 (×5): qty 1

## 2019-01-12 MED ORDER — VITAMIN C 500 MG PO TABS
500.0000 mg | ORAL_TABLET | Freq: Every day | ORAL | Status: DC
  Administered 2019-01-12 – 2019-01-19 (×8): 500 mg via ORAL
  Filled 2019-01-12 (×8): qty 1

## 2019-01-12 NOTE — ED Triage Notes (Signed)
Pt arrives via [facility] from home, pt was d/c from [facility] yesterday (covid positive and bilateral pneumonia) pt felt good yesterday but states that this morning he began coughing a lot and having more sob when walking to the restroom. O2 sat on room air 88%, improved to mid to high 90s on 4L [place]. A/ox4, ambulatory on scene. Afebrile, denies n/v/d. 12 lead unremarkable per ems.

## 2019-01-12 NOTE — H&P (Signed)
History and Physical    Larry Richmond [id number] DOB: 1951/03/13 DOA: 01/12/2019  Referring MD/NP/PA: Shirlyn Goltz, MD PCP: Default, Provider, MD  Patient coming from: Home  Chief Complaint: Cough and shortness of breath  I have personally briefly reviewed patient's old medical records in [place]   HPI: Larry Richmond is a 68 y.o. male with medical history significant of hypertension, diabetes mellitus type 2, thyroidectomy for goiter, and GERD who presents with complaints of persistent cough and shortness of breath.  Patient was just hospitalized from 10/23-10/29 with pneumonia secondary to COVID-19.  During his hospitalization he received 5 days of remdesivir, plasma, Actemra, and steroids.  He had been initially started on empiric antibiotics of cefepime as  procalcitonin was initially noted to be elevated, but was discharged home to complete a 10-day course of Levaquin.  At the time of discharge he did not require oxygen.  However, after getting home patient reported having worsening cough being unable to sleep throughout the night.  Denies having any significant fever, headache, nausea, vomiting, or diarrhea symptoms at this time.  He was unable to collect any significant distance due to cough and worsening shortness of breath.   ED Course: Upon admission into the emergency department patient was seen to be afebrile, respirations 26-29, O2 saturations 92 to 97% on 2 L nasal cannula oxygen, and all other vital signs maintained.  Labs were relatively unremarkable except for lactic acid 2.1.  Chest x-ray showed worsening multifocal pneumonia.  Patient was given Tylenol 1000 mg, vancomycin, cefepime, and Reglan.  Review of systems: A complete 10 point review of systems was performed and negative except for as noted above in the HPI  Past Medical History:  Diagnosis Date  . Diabetes mellitus without complication ([place])   . GERD (gastroesophageal reflux disease)   . Hyperlipidemia   .  Thyroid disease     Past Surgical History:  Procedure Laterality Date  . THYROID SURGERY       reports that he has quit smoking. His smoking use included cigarettes. He has never used smokeless tobacco. He reports previous alcohol use. He reports current drug use. Drug: Marijuana.  No Known Allergies  Family History  Problem Relation Age of Onset  . Alzheimer's disease Mother     Prior to Admission medications   Medication Sig Start Date End Date Taking? Authorizing Provider  acetaminophen (TYLENOL) 500 MG tablet Take 1 tablet (500 mg total) by mouth every 6 (six) hours as needed. Patient taking differently: Take 500 mg by mouth every 4 (four) hours as needed for fever (pain).  01/02/19   Corena Herter, PA-C  atorvastatin (LIPITOR) 40 MG tablet Take 40 mg by mouth daily.    [provider]  dexamethasone (DECADRON) 6 MG tablet Take 1 tablet (6 mg total) by mouth daily. 01/11/19   Caren Griffins, MD  diphenoxylate-atropine (LOMOTIL) 2.5-0.025 MG tablet Take 1 tablet by mouth 4 (four) times daily as needed for diarrhea or loose stools.    [provider]  hydrOXYzine (ATARAX/VISTARIL) 25 MG tablet Take 1 tablet (25 mg total) by mouth at bedtime as needed for up to 21 days (Sleep). Patient taking differently: Take 25 mg by mouth at bedtime as needed for anxiety (Sleep).  01/02/19 01/23/19  Corena Herter, PA-C  levofloxacin (LEVAQUIN) 500 MG tablet Take 1 tablet (500 mg total) by mouth daily for 10 days. Take for 1 more day. 01/11/19 01/21/19  Caren Griffins, MD  levothyroxine (SYNTHROID) 100 MCG tablet Take 100 mcg by mouth daily before breakfast.    [provider]  metFORMIN (GLUCOPHAGE) 500 MG tablet Take 500 mg by mouth daily with breakfast.    [provider]    Physical Exam:  Constitutional: Elderly male who appears to be in some respiratory discomfort intermittently coughing Vitals:   01/12/19 1130 01/12/19 1348 01/12/19 1400  01/12/19 1430  BP: (!) 150/81 (!) 150/85 123/69 118/78  Pulse: 73 87 79 73  Resp: (!) 26  (!) 26   Temp:      TempSrc:      SpO2: 97% 92% 93% 93%   Eyes: PERRL, lids and conjunctivae normal ENMT: Mucous membranes are moist. Posterior pharynx clear of any exudate or lesions. .  Neck: normal, supple, no masses, no thyromegaly Respiratory: Tachypneic with decreased aeration appreciated.  No significant wheezes, rhonchi, or rales appreciated.  Patient currently on 2 L nasal cannula oxygen maintaining O2 saturations. Cardiovascular: Regular rate and rhythm, no murmurs / rubs / gallops. No extremity edema. 2+ pedal pulses. No carotid bruits.  Abdomen: no tenderness, no masses palpated. No hepatosplenomegaly. Bowel sounds positive.  Musculoskeletal: no clubbing / cyanosis. No joint deformity upper and lower extremities. Good ROM, no contractures. Normal muscle tone.  Skin: no rashes, lesions, ulcers. No induration Neurologic: CN 2-12 grossly intact. Sensation intact, DTR normal. Strength 5/5 in all 4.  Psychiatric: Normal judgment and insight. Alert and oriented x 3.  Disgruntled mood.     Labs on Admission: I have personally reviewed following labs and imaging studies  CBC: Recent Labs  Lab 01/07/19 0106 01/07/19 1325 01/08/19 0720 01/09/19 0115 01/10/19 0505 01/12/19 1131  WBC 6.1  --  3.2* 3.9* 4.3 7.4  NEUTROABS  --   --   --   --   --  5.6  HGB 12.9* 14.6 13.4 14.0 12.9* 14.9  HCT 38.7* 43.0 40.2 44.2 40.7 44.8  MCV 89.2  --  90.7 92.9 91.5 89.8  PLT 164  --  179 174 232 [id number]   Basic Metabolic Panel: Recent Labs  Lab 01/08/19 0720 01/09/19 0115 01/10/19 0505 01/11/19 0455 01/12/19 1131  NA 133* 134* 135 134* 138  K 4.2 4.1 4.5 4.7 4.2  CL 100 101 102 100 104  CO2 26 25 24 25 23   GLUCOSE 121* 126* 121* 125* 89  BUN 22 27* 24* 27* 21  CREATININE 1.14 1.07 1.08 1.09 1.13  CALCIUM 8.1* 8.3* 8.3* 8.5* 8.4*   GFR: Estimated Creatinine Clearance: 94.5 mL/min (by C-G  formula based on SCr of 1.13 mg/dL). Liver Function Tests: Recent Labs  Lab 01/07/19 0106 01/08/19 0720 01/09/19 0115 01/10/19 0505 01/12/19 1131  AST 82* 46* 43* 28 45*  ALT 71* 52* 49* 39 55*  ALKPHOS 62 55 56 51 52  BILITOT 0.7 0.5 0.3 0.5 0.5  PROT 7.0 6.7 6.8 6.3* 6.4*  ALBUMIN 2.9* 2.6* 2.8* 2.6* 2.7*   No results for input(s): LIPASE, AMYLASE in the last 168 hours. No results for input(s): AMMONIA in the last 168 hours. Coagulation Profile: No results for input(s): INR, PROTIME in the last 168 hours. Cardiac Enzymes: No results for input(s): CKTOTAL, CKMB, CKMBINDEX, TROPONINI in the last 168 hours. BNP (last 3 results) No results for input(s): PROBNP in the last 8760 hours. HbA1C: No results for input(s): HGBA1C in the last 72 hours. CBG: Recent Labs  Lab 01/10/19 1144 01/10/19 1601 01/10/19 2132 01/11/19 0743 01/11/19 1159  GLUCAP 110* 82 146*  113* 111*   Lipid Profile: Recent Labs    01/12/19 1131  TRIG 153*   Thyroid Function Tests: No results for input(s): TSH, T4TOTAL, FREET4, T3FREE, THYROIDAB in the last 72 hours. Anemia Panel: Recent Labs    01/10/19 0505 01/12/19 1131  FERRITIN 915* 910*   Urine analysis:    Component Value Date/Time   COLORURINE AMBER (A) 01/02/2019 1252   APPEARANCEUR HAZY (A) 01/02/2019 1252   LABSPEC 1.025 01/02/2019 1252   PHURINE 5.0 01/02/2019 1252   GLUCOSEU NEGATIVE 01/02/2019 1252   HGBUR NEGATIVE 01/02/2019 1252   BILIRUBINUR NEGATIVE 01/02/2019 1252   KETONESUR 5 (A) 01/02/2019 1252   PROTEINUR 30 (A) 01/02/2019 1252   NITRITE NEGATIVE 01/02/2019 1252   LEUKOCYTESUR NEGATIVE 01/02/2019 1252   Sepsis Labs: Recent Results (from the past 240 hour(s))  SARS Coronavirus 2 by RT PCR (hospital order, performed in [place] hospital lab) Nasopharyngeal Nasopharyngeal Swab     Status: Abnormal   Collection Time: 01/05/19 11:46 AM   Specimen: Nasopharyngeal Swab  Result Value Ref Range Status   SARS  Coronavirus 2 POSITIVE (A) NEGATIVE Final    Comment: RESULT CALLED TO, READ BACK BY AND VERIFIED WITH: Bethena Midget RN 14:10 01/05/19 ([email or web address]) (NOTE) If result is NEGATIVE SARS-CoV-2 target nucleic acids are NOT DETECTED. The SARS-CoV-2 RNA is generally detectable in upper and lower  respiratory specimens during the acute phase of infection. The lowest  concentration of SARS-CoV-2 viral copies this assay can detect is 250  copies / mL. A negative result does not preclude SARS-CoV-2 infection  and should not be used as the sole basis for treatment or other  patient management decisions.  A negative result may occur with  improper specimen collection / handling, submission of specimen other  than nasopharyngeal swab, presence of viral mutation(s) within the  areas targeted by this assay, and inadequate number of viral copies  (<250 copies / mL). A negative result must be combined with clinical  observations, patient history, and epidemiological information. If result is POSITIVE SARS-CoV-2 target nucleic acids are DETECTED.  The SARS-CoV-2 RNA is generally detectable in upper and lower  respiratory specimens during the acute phase of infection.  Positive  results are indicative of active infection with SARS-CoV-2.  Clinical  correlation with patient history and other diagnostic information is  necessary to determine patient infection status.  Positive results do  not rule out bacterial infection or co-infection with other viruses. If result is PRESUMPTIVE POSTIVE SARS-CoV-2 nucleic acids MAY BE PRESENT.   A presumptive positive result was obtained on the submitted specimen  and confirmed on repeat testing.  While 2019 novel coronavirus  (SARS-CoV-2) nucleic acids may be present in the submitted sample  additional confirmatory testing may be necessary for epidemiological  and / or clinical management purposes  to differentiate between  SARS-CoV-2 and other Sarbecovirus currently known to  infect humans.  If clinically indicated additional testing with an alternate test  methodology [phone]) i s advised. The SARS-CoV-2 RNA is generally  detectable in upper and lower respiratory specimens during the acute  phase of infection. The expected result is Negative. Fact Sheet for Patients:  StrictlyIdeas.no Fact Sheet for Healthcare Providers: BankingDealers.co.za This test is not yet approved or cleared by the [place] FDA and has been authorized for detection and/or diagnosis of SARS-CoV-2 by FDA under an Emergency Use Authorization (EUA).  This EUA will remain in effect (meaning this test can be used) for the duration of the COVID-19  declaration under Section 564(b)(1) of the Act, 21 U.S.C. section 360bbb-3(b)(1), unless the authorization is terminated or revoked sooner. Performed at [facility], [place] [address], [place], [place] [address]   Blood Culture (routine x 2)     Status: None   Collection Time: 01/05/19  1:00 PM   Specimen: BLOOD  Result Value Ref Range Status   Specimen Description BLOOD SITE NOT SPECIFIED  Final   Special Requests   Final    BOTTLES DRAWN AEROBIC AND ANAEROBIC Blood Culture adequate volume   Culture   Final    NO GROWTH 5 DAYS Performed at [facility], [address] [place], [place] [address]    Report Status 01/10/2019 FINAL  Final  Blood Culture (routine x 2)     Status: None   Collection Time: 01/05/19  1:00 PM   Specimen: BLOOD  Result Value Ref Range Status   Specimen Description BLOOD SITE NOT SPECIFIED  Final   Special Requests   Final    BOTTLES DRAWN AEROBIC ONLY Blood Culture results may not be optimal due to an inadequate volume of blood received in culture bottles   Culture   Final    NO GROWTH 5 DAYS Performed at [facility], [place] [address] [place], [place] [address]    Report Status 01/10/2019 FINAL  Final  Blood Culture (routine x 2)     Status:  None (Preliminary result)   Collection Time: 01/12/19 11:36 AM   Specimen: BLOOD  Result Value Ref Range Status   Specimen Description BLOOD RIGHT ANTECUBITAL  Final   Special Requests   Final    BOTTLES DRAWN AEROBIC AND ANAEROBIC Blood Culture results may not be optimal due to an inadequate volume of blood received in culture bottles   Culture   Final    NO GROWTH <12 HOURS Performed at [facility], [place] [address] [place], [place] [address]    Report Status PENDING  Incomplete     Radiological Exams on Admission: Dg Chest Portable 1 View  Result Date: 01/12/2019 CLINICAL DATA:  Shortness of breath.  COVID positive. EXAM: PORTABLE CHEST 1 VIEW COMPARISON:  January 05, 2019 FINDINGS: There is airspace consolidation throughout both lower lobe regions which appear similar on the right and slightly increased on the left compared to 1 week prior. There is new airspace opacity in the right upper lobe. There is stable scarring in the right upper lobe with a large focal bulla in the right upper lobe toward the apex. Left upper lobe is clear. There is mild cardiomegaly with pulmonary vascularity normal. No adenopathy. No bone lesions. IMPRESSION: Multifocal airspace opacity consistent with widespread pneumonia. Increase in opacity in the left lower lung region and new increased opacity in the right upper lobe. Stable opacity right lower lung region. Stable scarring and prominent bulla in the right upper lobe. Stable cardiac prominence.  No evident adenopathy. Electronically Signed   By: Lowella Grip III M.D.   On: 01/12/2019 11:52    EKG: Independently reviewed. Sinus rhythm 73 bpm   Assessment/Plan Acute respiratory failure with hypoxia secondary to pneumonia due to COVID-19: Patient was just recently discharged from the hospital yesterday for COVID-19.  Chest x-ray showing worsening multifocal pneumonia.  Patient had been initially placed on empiric antibiotics of vancomycin and  cefepime.  Inflammatory markers elevated ferritin on 10, triglycerides 153, LDH 359, D-dimer 17.54, fibrinogen 499, and lactic acid 2.1.  CRP and procalcitonin unremarkable. -Admit  to a telemetry bed -Continuous pulse oximetry with nasal cannula oxygen  -Albuterol inhaler -Continue Decadron 6 mg daily -De-escalate antibiotics back to Levaquin to complete course -Vitamin C and zinc -Antitussives as needed -Monitor inflammatory markers daily  Lactic acidosis: Acute.  Initial lactic acid elevated 2.1. -Follow-up blood cultures -Trend lactic acid level  Prediabetes/diabetes mellitus type 2: Last hemoglobin A1c noted to be 6 on 01/05/2019.  Patient well controlled on home medications of metformin. -Hypoglycemic protocols -Hold Metformin -CBGs before every meal with sensitive SSI  Postsurgical hypothyroidism: Last TSH noted to be 0.512 on 10/23. -Continue levothyroxine  Hyperlipidemia -Continue Crestor  GERD -Continue Protonix  DVT prophylaxis: lovenox Code Status: Full Family Communication: Plan of care discussed with patient's wife over the phone. Disposition Plan: To be determined Consults called: None Admission status: Inpatient  Norval Morton MD [facility] Pager [phone]   If 7PM-7AM, please contact night-coverage [email or web address] Password [email or web address]  01/12/2019, 3:02 PM

## 2019-01-12 NOTE — ED Notes (Signed)
Dinner Tray Ordered @ 1733. 

## 2019-01-12 NOTE — ED Notes (Signed)
Attempted to call [facility] x1

## 2019-01-12 NOTE — ED Provider Notes (Addendum)
[facility] DEPARTMENT Provider Note   CSN: [id number] Arrival date & time: 01/12/19  1123     History   Chief Complaint Chief Complaint  Patient presents with  . covid positive  . Pneumonia    HPI RAKEEN GINGER is a 68 y.o. male hx of recent pneumonia, DM, HL, HTN, COVID, here with trouble breathing.  Patient states that he was just admitted and discharged from hospital yesterday .  Upon review of his records, he was diagnosed with coronavirus and had pneumonia and was sent home with Levaquin yesterday .  He has been coughing all night and has been very short of breath.  When EMS got there, patient was noted to have an oxygen level of 88%.  Patient states that he has been not able to breathe after he left the hospital.  He is denies any further fevers.     The history is provided by the patient.    Past Medical History:  Diagnosis Date  . Diabetes mellitus without complication ([place])   . GERD (gastroesophageal reflux disease)   . Hyperlipidemia   . Thyroid disease     Patient Active Problem List   Diagnosis Date Noted  . Thyroid disease   . Morbid obesity ([place])   . COVID-19 virus infection 01/05/2019  . Hyponatremia   . Acute on chronic respiratory failure with hypoxia ([place])   . CAP (community acquired pneumonia) 08/10/2015  . Elevated blood pressure 08/10/2015  . Lower leg edema 08/10/2015  . Tobacco use disorder 08/10/2015    Past Surgical History:  Procedure Laterality Date  . THYROID SURGERY          Home Medications    Prior to Admission medications   Medication Sig Start Date End Date Taking? Authorizing Provider  acetaminophen (TYLENOL) 500 MG tablet Take 1 tablet (500 mg total) by mouth every 6 (six) hours as needed. Patient taking differently: Take 500 mg by mouth every 4 (four) hours as needed for fever (pain).  01/02/19   Corena Herter, PA-C  atorvastatin (LIPITOR) 40 MG tablet Take 40 mg by mouth daily.     [provider]  dexamethasone (DECADRON) 6 MG tablet Take 1 tablet (6 mg total) by mouth daily. 01/11/19   Caren Griffins, MD  diphenoxylate-atropine (LOMOTIL) 2.5-0.025 MG tablet Take 1 tablet by mouth 4 (four) times daily as needed for diarrhea or loose stools.    [provider]  hydrOXYzine (ATARAX/VISTARIL) 25 MG tablet Take 1 tablet (25 mg total) by mouth at bedtime as needed for up to 21 days (Sleep). Patient taking differently: Take 25 mg by mouth at bedtime as needed for anxiety (Sleep).  01/02/19 01/23/19  Corena Herter, PA-C  levofloxacin (LEVAQUIN) 500 MG tablet Take 1 tablet (500 mg total) by mouth daily for 10 days. Take for 1 more day. 01/11/19 01/21/19  Caren Griffins, MD  levothyroxine (SYNTHROID) 100 MCG tablet Take 100 mcg by mouth daily before breakfast.    [provider]  metFORMIN (GLUCOPHAGE) 500 MG tablet Take 500 mg by mouth daily with breakfast.    [provider]    Family History Family History  Problem Relation Age of Onset  . Alzheimer's disease Mother     Social History Social History   Tobacco Use  . Smoking status: Former Smoker    Types: Cigarettes  . Smokeless tobacco: Never Used  Substance Use Topics  . Alcohol use: Not Currently  Comment: quit a year ago  . Drug use: Yes    Types: Marijuana    Comment: past hx of cocaine.denies use now.     Allergies   Patient has no known allergies.   Review of Systems Review of Systems  Respiratory: Positive for cough and shortness of breath.   All other systems reviewed and are negative.    Physical Exam Updated Vital Signs BP 118/78   Pulse 73   Temp 98.1 F (36.7 C) (Oral)   Resp (!) 26   SpO2 93%   Physical Exam Vitals signs and nursing note reviewed.  HENT:     Head: Normocephalic.     Nose: Nose normal.     Mouth/Throat:     Mouth: Mucous membranes are moist.  Eyes:     Extraocular Movements: Extraocular movements intact.      Pupils: Pupils are equal, round, and reactive to light.  Neck:     Musculoskeletal: Normal range of motion.  Cardiovascular:     Rate and Rhythm: Normal rate and regular rhythm.     Pulses: Normal pulses.     Heart sounds: Normal heart sounds.  Pulmonary:     Comments: Diminished bilateral  Abdominal:     General: Abdomen is flat.     Palpations: Abdomen is soft.  Musculoskeletal: Normal range of motion.  Skin:    General: Skin is warm.     Capillary Refill: Capillary refill takes less than 2 seconds.  Neurological:     General: No focal deficit present.     Mental Status: He is alert and oriented to person, place, and time.  Psychiatric:        Mood and Affect: Mood normal.        Behavior: Behavior normal.      ED Treatments / Results  Labs (all labs ordered are listed, but only abnormal results are displayed) Labs Reviewed  LACTIC ACID, PLASMA - Abnormal; Notable for the following components:      Result Value   Lactic Acid, Venous 2.1 (*)    All other components within normal limits  CBC WITH DIFFERENTIAL/PLATELET - Abnormal; Notable for the following components:   Abs Immature Granulocytes 0.34 (*)    All other components within normal limits  COMPREHENSIVE METABOLIC PANEL - Abnormal; Notable for the following components:   Calcium 8.4 (*)    Total Protein 6.4 (*)    Albumin 2.7 (*)    AST 45 (*)    ALT 55 (*)    All other components within normal limits  D-DIMER, QUANTITATIVE (NOT AT [facility]) - Abnormal; Notable for the following components:   D-Dimer, Quant 17.54 (*)    All other components within normal limits  LACTATE DEHYDROGENASE - Abnormal; Notable for the following components:   LDH 359 (*)    All other components within normal limits  FERRITIN - Abnormal; Notable for the following components:   Ferritin 910 (*)    All other components within normal limits  TRIGLYCERIDES - Abnormal; Notable for the following components:   Triglycerides 153 (*)    All  other components within normal limits  FIBRINOGEN - Abnormal; Notable for the following components:   Fibrinogen 499 (*)    All other components within normal limits  CULTURE, BLOOD (ROUTINE X 2)  CULTURE, BLOOD (ROUTINE X 2)  C-REACTIVE PROTEIN  LACTIC ACID, PLASMA  PROCALCITONIN    EKG EKG Interpretation  Date/Time:  Friday January 12 2019 11:23:17 EDT Ventricular Rate:  73 PR Interval:    QRS Duration: 84 QT Interval:  407 QTC Calculation: 449 R Axis:   60 Text Interpretation: Age not entered, assumed to be  68 years old for purpose of ECG interpretation Sinus rhythm Consider left atrial enlargement No significant change since last tracing Confirmed by Wandra Arthurs [id number]) on 01/12/2019 1:19:17 PM   Radiology Dg Chest Portable 1 View  Result Date: 01/12/2019 CLINICAL DATA:  Shortness of breath.  COVID positive. EXAM: PORTABLE CHEST 1 VIEW COMPARISON:  January 05, 2019 FINDINGS: There is airspace consolidation throughout both lower lobe regions which appear similar on the right and slightly increased on the left compared to 1 week prior. There is new airspace opacity in the right upper lobe. There is stable scarring in the right upper lobe with a large focal bulla in the right upper lobe toward the apex. Left upper lobe is clear. There is mild cardiomegaly with pulmonary vascularity normal. No adenopathy. No bone lesions. IMPRESSION: Multifocal airspace opacity consistent with widespread pneumonia. Increase in opacity in the left lower lung region and new increased opacity in the right upper lobe. Stable opacity right lower lung region. Stable scarring and prominent bulla in the right upper lobe. Stable cardiac prominence.  No evident adenopathy. Electronically Signed   By: Lowella Grip III M.D.   On: 01/12/2019 11:52    Procedures Procedures (including critical care time)  Angiocath insertion Performed by: Wandra Arthurs  Consent: Verbal consent obtained. Risks and  benefits: risks, benefits and alternatives were discussed Time out: Immediately prior to procedure a "time out" was called to verify the correct patient, procedure, equipment, support staff and site/side marked as required.  Preparation: Patient was prepped and draped in the usual sterile fashion.  Vein Location: R antecube  Ultrasound Guided  Gauge: 20 long   Normal blood return and flush without difficulty Patient tolerance: Patient tolerated the procedure well with no immediate complications.  CRITICAL CARE Performed by: Wandra Arthurs   Total critical care time:30 minutes  Critical care time was exclusive of separately billable procedures and treating other patients.  Critical care was necessary to treat or prevent imminent or life-threatening deterioration.  Critical care was time spent personally by me on the following activities: development of treatment plan with patient and/or surrogate as well as nursing, discussions with consultants, evaluation of patient's response to treatment, examination of patient, obtaining history from patient or surrogate, ordering and performing treatments and interventions, ordering and review of laboratory studies, ordering and review of radiographic studies, pulse oximetry and re-evaluation of patient's condition.     Medications Ordered in ED Medications  ceFEPIme (MAXIPIME) 2 g in sodium chloride 0.9 % 100 mL IVPB (2 g Intravenous New Bag/Given 01/12/19 1452)  metoCLOPramide (REGLAN) injection 10 mg (10 mg Intravenous Given 01/12/19 1337)  acetaminophen (TYLENOL) tablet 1,000 mg (1,000 mg Oral Given 01/12/19 1333)  vancomycin (VANCOCIN) IVPB 1000 mg/200 mL premix (0 mg Intravenous Stopped 01/12/19 1453)  diphenhydrAMINE (BENADRYL) injection 25 mg (25 mg Intravenous Given 01/12/19 1335)     Initial Impression / Assessment and Plan / ED Course  I have reviewed the triage vital signs and the nursing notes.  Pertinent labs & imaging results  that were available during my care of the patient were reviewed by me and considered in my medical decision making (see chart for details).        WESS SULEIMAN is a 68 y.o. male recent COVID here with SOB. Hypoxic to  88% with ambulation. O2 93% on 2 L [place]. Will get COVID admission labs, CXR.   3:06 PM Patient's inflammatory markers are elevated. CXR showed multi focal pneumonia. Given IV abx. I talked to daughter, who agreed with admission.  Patient initially did not want to get admitted but then agreed to admission the daughter is the healthcare proxy and states that he gets agitated in the hospital and sometimes require sedation but agrees with admission.   MCRAE DRACE was evaluated in Emergency Department on 01/12/2019 for the symptoms described in the history of present illness. He was evaluated in the context of the global COVID-19 pandemic, which necessitated consideration that the patient might be at risk for infection with the SARS-CoV-2 virus that causes COVID-19. Institutional protocols and algorithms that pertain to the evaluation of patients at risk for COVID-19 are in a state of rapid change based on information released by regulatory bodies including the CDC and federal and state organizations. These policies and algorithms were followed during the patient's care in the ED.   Final Clinical Impressions(s) / ED Diagnoses   Final diagnoses:  HCAP (healthcare-associated pneumonia)  COVID-19 virus detected    ED Discharge Orders    None       Drenda Freeze, MD 01/12/19 1505    Drenda Freeze, MD 01/12/19 [phone]

## 2019-01-12 NOTE — ED Notes (Signed)
Patient daughter calling [name] Mitro [phone]  Would like a call back for an update on patient

## 2019-01-12 NOTE — ED Notes (Signed)
Attempted to call report x 1  

## 2019-01-12 NOTE — ED Notes (Signed)
[facility] called to escort pt to [employer] called to cancel transport services

## 2019-01-12 NOTE — ED Notes (Signed)
Patient on 2L O2 via  with O2 sat 97-98%

## 2019-01-12 NOTE — Progress Notes (Signed)
Spoke with pts daughter on the phone, she stated that her dad was having difficulty breathing at night and was not sent home with O2. This nurse looked in the orders no O2 was ordered for home. Spoke with Dr. Cruzita Lederer and he stated pt O2 was above 90% when RN [place] Ar walked him. Dr stated since this is an acute change from yesterday it is advised he go to the ED as it could be a more serious problem. Dr. Also stated he would be happy to put the O2 orders in if needed. This nurse spoke with Manuela Schwartz CM, and she stated yes pt needs to go to the ED to be seen, due to liability an order for home O2 can not be given since pt did not qualify for O2 while in the hospital for home O2. Pts daughter [name] asked if there could be an exception to the rule and I spoke with Manuela Schwartz once again and she advised pt needs to be seen in the ED. [name] stated that her father would not go the the ED. This nurse stated that when he was in the hospital and did the O2 walk test he did not qualify and the only way to reassess would be to go to the ED, and get evaluated.

## 2019-01-13 ENCOUNTER — Encounter (HOSPITAL_COMMUNITY): Payer: Self-pay | Admitting: Internal Medicine

## 2019-01-13 DIAGNOSIS — Z9989 Dependence on other enabling machines and devices: Secondary | ICD-10-CM

## 2019-01-13 DIAGNOSIS — Z9119 Patient's noncompliance with other medical treatment and regimen: Secondary | ICD-10-CM

## 2019-01-13 DIAGNOSIS — Z91199 Patient's noncompliance with other medical treatment and regimen due to unspecified reason: Secondary | ICD-10-CM

## 2019-01-13 DIAGNOSIS — E1121 Type 2 diabetes mellitus with diabetic nephropathy: Secondary | ICD-10-CM

## 2019-01-13 DIAGNOSIS — E039 Hypothyroidism, unspecified: Secondary | ICD-10-CM

## 2019-01-13 DIAGNOSIS — F17200 Nicotine dependence, unspecified, uncomplicated: Secondary | ICD-10-CM

## 2019-01-13 DIAGNOSIS — G4733 Obstructive sleep apnea (adult) (pediatric): Secondary | ICD-10-CM

## 2019-01-13 DIAGNOSIS — I10 Essential (primary) hypertension: Secondary | ICD-10-CM | POA: Diagnosis present

## 2019-01-13 DIAGNOSIS — J449 Chronic obstructive pulmonary disease, unspecified: Secondary | ICD-10-CM | POA: Diagnosis present

## 2019-01-13 DIAGNOSIS — J431 Panlobular emphysema: Secondary | ICD-10-CM

## 2019-01-13 DIAGNOSIS — E7800 Pure hypercholesterolemia, unspecified: Secondary | ICD-10-CM

## 2019-01-13 HISTORY — DX: Essential (primary) hypertension: I10

## 2019-01-13 LAB — CBC WITH DIFFERENTIAL/PLATELET
Abs Immature Granulocytes: 0.37 10*3/uL — ABNORMAL HIGH (ref 0.00–0.07)
Basophils Absolute: 0.1 10*3/uL (ref 0.0–0.1)
Basophils Relative: 1 %
Eosinophils Absolute: 0.1 10*3/uL (ref 0.0–0.5)
Eosinophils Relative: 1 %
HCT: 45.2 % (ref 39.0–52.0)
Hemoglobin: 14.8 g/dL (ref 13.0–17.0)
Immature Granulocytes: 3 %
Lymphocytes Relative: 9 %
Lymphs Abs: 1.4 10*3/uL (ref 0.7–4.0)
MCH: 29.8 pg (ref 26.0–34.0)
MCHC: 32.7 g/dL (ref 30.0–36.0)
MCV: 91.1 fL (ref 80.0–100.0)
Monocytes Absolute: 0.5 10*3/uL (ref 0.1–1.0)
Monocytes Relative: 3 %
Neutro Abs: 12.6 10*3/uL — ABNORMAL HIGH (ref 1.7–7.7)
Neutrophils Relative %: 83 %
Platelets: 215 10*3/uL (ref 150–400)
RBC: 4.96 MIL/uL (ref 4.22–5.81)
RDW: 15.2 % (ref 11.5–15.5)
WBC: 15 10*3/uL — ABNORMAL HIGH (ref 4.0–10.5)
nRBC: 0 % (ref 0.0–0.2)

## 2019-01-13 LAB — GLUCOSE, CAPILLARY
Glucose-Capillary: 151 mg/dL — ABNORMAL HIGH (ref 70–99)
Glucose-Capillary: 145 mg/dL — ABNORMAL HIGH (ref 70–99)
Glucose-Capillary: 128 mg/dL — ABNORMAL HIGH (ref 70–99)
Glucose-Capillary: 120 mg/dL — ABNORMAL HIGH (ref 70–99)

## 2019-01-13 LAB — FERRITIN: Ferritin: 944 ng/mL — ABNORMAL HIGH (ref 24–336)

## 2019-01-13 LAB — COMPREHENSIVE METABOLIC PANEL WITH GFR
ALT: 53 U/L — ABNORMAL HIGH (ref 0–44)
AST: 32 U/L (ref 15–41)
Albumin: 3.1 g/dL — ABNORMAL LOW (ref 3.5–5.0)
Alkaline Phosphatase: 56 U/L (ref 38–126)
Anion gap: 9 (ref 5–15)
BUN: 20 mg/dL (ref 8–23)
CO2: 22 mmol/L (ref 22–32)
Calcium: 8.4 mg/dL — ABNORMAL LOW (ref 8.9–10.3)
Chloride: 102 mmol/L (ref 98–111)
Creatinine, Ser: 0.98 mg/dL (ref 0.61–1.24)
GFR calc Af Amer: >60 mL/min
GFR calc non Af Amer: >60 mL/min
Glucose, Bld: 118 mg/dL — ABNORMAL HIGH (ref 70–99)
Potassium: 4 mmol/L (ref 3.5–5.1)
Sodium: 133 mmol/L — ABNORMAL LOW (ref 135–145)
Total Bilirubin: 0.9 mg/dL (ref 0.3–1.2)
Total Protein: 7.1 g/dL (ref 6.5–8.1)

## 2019-01-13 LAB — PHOSPHORUS: Phosphorus: 2.7 mg/dL (ref 2.5–4.6)

## 2019-01-13 LAB — PROCALCITONIN: Procalcitonin: <0.1 ng/mL

## 2019-01-13 LAB — D-DIMER, QUANTITATIVE: D-Dimer, Quant: >20 ug{FEU}/mL — ABNORMAL HIGH (ref 0.00–0.50)

## 2019-01-13 LAB — MAGNESIUM: Magnesium: 2 mg/dL (ref 1.7–2.4)

## 2019-01-13 LAB — C-REACTIVE PROTEIN: CRP: <0.8 mg/dL (ref <1.0)

## 2019-01-13 MED ORDER — ALUM & MAG HYDROXIDE-SIMETH 200-200-20 MG/5ML PO SUSP
30.0000 mL | ORAL | Status: DC | PRN
  Administered 2019-01-13 – 2019-01-18 (×4): 30 mL via ORAL
  Filled 2019-01-13 (×4): qty 30

## 2019-01-13 MED ORDER — LEVOTHYROXINE SODIUM 50 MCG PO TABS
100.0000 ug | ORAL_TABLET | Freq: Every day | ORAL | Status: DC
  Administered 2019-01-13 – 2019-01-19 (×7): 100 ug via ORAL
  Filled 2019-01-13 (×7): qty 2

## 2019-01-13 MED ORDER — LISINOPRIL 2.5 MG PO TABS
2.5000 mg | ORAL_TABLET | Freq: Every day | ORAL | Status: DC
  Administered 2019-01-13 – 2019-01-14 (×2): 2.5 mg via ORAL
  Filled 2019-01-13 (×3): qty 1

## 2019-01-13 MED ORDER — ENOXAPARIN SODIUM 80 MG/0.8ML ~~LOC~~ SOLN
75.0000 mg | SUBCUTANEOUS | Status: DC
  Administered 2019-01-13 – 2019-01-16 (×4): 75 mg via SUBCUTANEOUS
  Filled 2019-01-13 (×4): qty 0.8

## 2019-01-13 MED ORDER — SODIUM CHLORIDE 0.9 % IV SOLN
100.0000 mg | INTRAVENOUS | Status: AC
  Administered 2019-01-13 – 2019-01-17 (×5): 100 mg via INTRAVENOUS
  Filled 2019-01-13 (×5): qty 20

## 2019-01-13 NOTE — Progress Notes (Signed)
Pharmacy Antibiotic Note  Larry Richmond is a 68 y.o. male admitted on 01/12/2019 with COVID 19.  Pharmacy has been consulted for Remdesivir dosing.  Pt was recently admitted an received course of Remdesivir 10/23>>10/27. Spoke with Dr. Sherral Hammers who would like to repeat the course with worsening disease.  CXR shows multifocal airspace opacity consistent with widespread pneumonia. Increase in opacity in the left lower lung region and new increased opacity in the right upper lobe. Stable opacity right lower lung region. Pt requiring supplemental oxygen (3L [place]) ALT 53  Plan: Remdesivir 100mg  IV daily x 5 days (no loading dose as this is second course within close time period) Will f/u ALT and pt's clinical condition  Height: 6\' 2"  (188 cm) Weight: (!) 336 lb 3.2 oz (152.5 kg) IBW/kg (Calculated) : 82.2  Temp (24hrs), Avg:98.1 F (36.7 C), Min:98 F (36.7 C), Max:98.1 F (36.7 C)  Recent Labs  Lab 01/08/19 0720 01/09/19 0115 01/10/19 0505 01/11/19 0455 01/12/19 1131 01/12/19 1253 01/13/19 0625  WBC 3.2* 3.9* 4.3  --  7.4  --  15.0*  CREATININE 1.14 1.07 1.08 1.09 1.13  --  0.98  LATICACIDVEN  --   --   --   --   --  2.1*  --     Estimated Creatinine Clearance: 109.4 mL/min (by C-G formula based on SCr of 0.98 mg/dL).    No Known Allergies  Antimicrobials this admission: Remdesivir 10/31>>11/4  Microbiology results: 10/30 BCx:   Thank you for allowing pharmacy to be a part of this patient's care.  Sherlon Handing, PharmD, BCPS CGV Clinical pharmacist phone [phone] 01/13/2019 8:41 AM

## 2019-01-13 NOTE — Progress Notes (Signed)
PROGRESS NOTE    Larry Richmond  [id number] DOB: 14-Jun-1950 DOA: 01/12/2019 PCP: Default, Provider, MD   Brief Narrative:  68 y.o. BM PMHx HTN, diabetes type 2 controlled with complication, HLD, chronic respiratory failure with hypoxia, tobacco use disorder, COPD,OSA (noncompliant), morbid obesity, s/p thyroidectomy for goiter, and GERD, medically noncompliant  who presents with complaints of persistent cough and shortness of breath.  Patient was just hospitalized from 10/23-10/29 with pneumonia secondary to COVID-19.  During his hospitalization he received 5 days of remdesivir, plasma, Actemra, and steroids.  He had been initially started on empiric antibiotics of cefepime as  procalcitonin was initially noted to be elevated, but was discharged home to complete a 10-day course of Levaquin.  At the time of discharge he did not require oxygen.  However, after getting home patient reported having worsening cough being unable to sleep throughout the night.  Denies having any significant fever, headache, nausea, vomiting, or diarrhea symptoms at this time.  He was unable to collect any significant distance due to cough and worsening shortness of breath.   ED Course: Upon admission into the emergency department patient was seen to be afebrile, respirations 26-29, O2 saturations 92 to 97% on 2 L nasal cannula oxygen, and all other vital signs maintained.  Labs were relatively unremarkable except for lactic acid 2.1.  Chest x-ray showed worsening multifocal pneumonia.  Patient was given Tylenol 1000 mg, vancomycin, cefepime, and Reglan.   Subjective: A/O x4, positive S OB, negative abdominal pain, negative N/V.   Assessment & Plan:   Principal Problem:   Pneumonia due to COVID-19 virus Active Problems:   Tobacco use disorder   Acute on chronic respiratory failure with hypoxia (HCC)   Hypothyroidism   Morbid obesity (HCC)   HLD (hyperlipidemia)   Lactic acidosis   Diabetes mellitus type 2,  controlled (HCC)   Benign essential HTN   COPD (chronic obstructive pulmonary disease) (HCC)   OSA on CPAP   Medically noncompliant   Pneumonia due to Covid 19/acute on chronic respiratory failure with hypoxia -Decadron 6 mg daily -Remdesivir completed on 10/27  -Repeat Remdesivir per pharmacy  COVID-19 Labs  Recent Labs    01/11/19 0455 01/12/19 1131 01/13/19 0625  DDIMER 1.27* 17.54* >20.00*  FERRITIN  --  910* 944*  LDH  --  359*  --   CRP 1.4* <0.8 <0.8    Lab Results  Component Value Date   SARSCOV2NAA POSITIVE (A) 01/05/2019   Tobacco use disorder -Patient smokes 1 pack every 3 days up until he contracted Covid.  We spoke at length about absolute need to discontinue smoking.. -Declined nicotine patch at this time.  COPD -Understands secondary to COPD + Covid pneumonia may have to go home on O2 for significant amount of time. -Will require spirometry pre-/post bronchodilation with DLCO after he fully recovered  OSA -Noncompliant with CPAP at home per daughter -We will counsel patient on importance of use of CPAP -See Covid pneumonia  Essential HTN -10/31 lisinopril 2.5 mg daily  Diabetes type 2 controlled with complication -[id number] hemoglobin A1c= 6.0 -Metformin held -Sensitive SSI -Lipid panel pending  HLD -Lipid panel pending  Hypothyroidism -Synthroid 100 mcg daily  Morbid obesity -Carb modified diet    DVT prophylaxis: Covid protocol Lovenox Code Status: Full Family Communication: 10/31 spoke with [name] (daughter) counseled her on plan of care answered all questions Disposition Plan: TBD   Consultants:    Procedures/Significant Events:  10/30 PCXR:-Multifocal airspace opacity consistent with widespread pneumonia.Increase  in opacity in the left lower lung region and new increased opacity in the right upper lobe.  -Stable opacity right lower lung region. -stable scarring and prominent bulla in the right upper lobe.     I have  personally reviewed and interpreted all radiology studies and my findings are as above.  VENTILATOR SETTINGS:    Cultures 10/30 blood RIGHT antecubital NGTD  10/31 respiratory virus panel pending 10/31 sputum pending    Antimicrobials: Anti-infectives (From admission, onward)   Start     Stop   01/13/19 1000  remdesivir 100 mg in sodium chloride 0.9 % 250 mL IVPB     01/18/19 0959   01/12/19 1245  vancomycin (VANCOCIN) IVPB 1000 mg/200 mL premix     01/12/19 1453   01/12/19 1245  ceFEPIme (MAXIPIME) 2 g in sodium chloride 0.9 % 100 mL IVPB     01/12/19 1707       Devices    LINES / TUBES:      Continuous Infusions: . remdesivir 100 mg in NS 250 mL 100 mg (01/13/19 0940)     Objective: Vitals:   01/13/19 0310 01/13/19 0716 01/13/19 1114 01/13/19 1528  BP:  123/80 (!) 145/93 (!) 157/83  Pulse: 76 88 75 75  Resp:  18 20 20   Temp:  98 F (36.7 C) 97.9 F (36.6 C) 97.7 F (36.5 C)  TempSrc:  Oral Axillary Oral  SpO2: 90% 93% 95% 96%  Weight:      Height:        Intake/Output Summary (Last 24 hours) at 01/13/2019 1744 Last data filed at 01/13/2019 1500 Gross per 24 hour  Intake 250 ml  Output -  Net 250 ml   Filed Weights   01/13/19 0019  Weight: (!) 152.5 kg    Examination:  General: A/O x4, positive acute respiratory distress Eyes: negative scleral hemorrhage, negative anisocoria, negative icterus ENT: Negative Runny nose, negative gingival bleeding, Neck:  Negative scars, masses, torticollis, lymphadenopathy, JVD Lungs: Tachypneic, diffuse decreased breath sounds without wheezes or crackles Cardiovascular: Regular rate and rhythm without murmur gallop or rub normal S1 and S2 Abdomen: Morbidly obese negative abdominal pain, nondistended, positive soft, bowel sounds, no rebound, no ascites, no appreciable mass Extremities: No significant cyanosis, clubbing, or edema bilateral lower extremities Skin: Negative rashes, lesions, ulcers Psychiatric:   Negative depression, negative anxiety, negative fatigue, negative mania  Central nervous system:  Cranial nerves II through XII intact, tongue/uvula midline, all extremities muscle strength 5/5, sensation intact throughout, negative dysarthria, negative expressive aphasia, negative receptive aphasia.  .     Data Reviewed: Care during the described time interval was provided by me .  I have reviewed this patient's available data, including medical history, events of note, physical examination, and all test results as part of my evaluation.   CBC: Recent Labs  Lab 01/08/19 0720 01/09/19 0115 01/10/19 0505 01/12/19 1131 01/13/19 0625  WBC 3.2* 3.9* 4.3 7.4 15.0*  NEUTROABS  --   --   --  5.6 12.6*  HGB 13.4 14.0 12.9* 14.9 14.8  HCT 40.2 44.2 40.7 44.8 45.2  MCV 90.7 92.9 91.5 89.8 91.1  PLT 179 174 232 [id number]   Basic Metabolic Panel: Recent Labs  Lab 01/09/19 0115 01/10/19 0505 01/11/19 0455 01/12/19 1131 01/13/19 0625  NA 134* 135 134* 138 133*  K 4.1 4.5 4.7 4.2 4.0  CL 101 102 100 104 102  CO2 25 24 25 23 22   GLUCOSE 126* 121* 125* 89  118*  BUN 27* 24* 27* 21 20  CREATININE 1.07 1.08 1.09 1.13 0.98  CALCIUM 8.3* 8.3* 8.5* 8.4* 8.4*  MG  --   --   --   --  2.0  PHOS  --   --   --   --  2.7   GFR: Estimated Creatinine Clearance: 109.4 mL/min (by C-G formula based on SCr of 0.98 mg/dL). Liver Function Tests: Recent Labs  Lab 01/08/19 0720 01/09/19 0115 01/10/19 0505 01/12/19 1131 01/13/19 0625  AST 46* 43* 28 45* 32  ALT 52* 49* 39 55* 53*  ALKPHOS 55 56 51 52 56  BILITOT 0.5 0.3 0.5 0.5 0.9  PROT 6.7 6.8 6.3* 6.4* 7.1  ALBUMIN 2.6* 2.8* 2.6* 2.7* 3.1*   No results for input(s): LIPASE, AMYLASE in the last 168 hours. No results for input(s): AMMONIA in the last 168 hours. Coagulation Profile: No results for input(s): INR, PROTIME in the last 168 hours. Cardiac Enzymes: No results for input(s): CKTOTAL, CKMB, CKMBINDEX, TROPONINI in the last 168 hours.  BNP (last 3 results) No results for input(s): PROBNP in the last 8760 hours. HbA1C: No results for input(s): HGBA1C in the last 72 hours. CBG: Recent Labs  Lab 01/11/19 1159 01/12/19 1711 01/13/19 0827 01/13/19 1115 01/13/19 1526  GLUCAP 111* 71 151* 145* 128*   Lipid Profile: Recent Labs    01/12/19 1131  TRIG 153*   Thyroid Function Tests: No results for input(s): TSH, T4TOTAL, FREET4, T3FREE, THYROIDAB in the last 72 hours. Anemia Panel: Recent Labs    01/12/19 1131 01/13/19 0625  FERRITIN 910* 944*   Urine analysis:    Component Value Date/Time   COLORURINE AMBER (A) 01/02/2019 1252   APPEARANCEUR HAZY (A) 01/02/2019 1252   LABSPEC 1.025 01/02/2019 1252   PHURINE 5.0 01/02/2019 1252   GLUCOSEU NEGATIVE 01/02/2019 1252   HGBUR NEGATIVE 01/02/2019 1252   BILIRUBINUR NEGATIVE 01/02/2019 1252   KETONESUR 5 (A) 01/02/2019 1252   PROTEINUR 30 (A) 01/02/2019 1252   NITRITE NEGATIVE 01/02/2019 1252   LEUKOCYTESUR NEGATIVE 01/02/2019 1252   Sepsis Labs: @LABRCNTIP (procalcitonin:4,lacticidven:4)  ) Recent Results (from the past 240 hour(s))  SARS Coronavirus 2 by RT PCR (hospital order, performed in [place] hospital lab) Nasopharyngeal Nasopharyngeal Swab     Status: Abnormal   Collection Time: 01/05/19 11:46 AM   Specimen: Nasopharyngeal Swab  Result Value Ref Range Status   SARS Coronavirus 2 POSITIVE (A) NEGATIVE Final    Comment: RESULT CALLED TO, READ BACK BY AND VERIFIED WITH: Bethena Midget RN 14:10 01/05/19 ([email or web address]) (NOTE) If result is NEGATIVE SARS-CoV-2 target nucleic acids are NOT DETECTED. The SARS-CoV-2 RNA is generally detectable in upper and lower  respiratory specimens during the acute phase of infection. The lowest  concentration of SARS-CoV-2 viral copies this assay can detect is 250  copies / mL. A negative result does not preclude SARS-CoV-2 infection  and should not be used as the sole basis for treatment or other  patient management  decisions.  A negative result may occur with  improper specimen collection / handling, submission of specimen other  than nasopharyngeal swab, presence of viral mutation(s) within the  areas targeted by this assay, and inadequate number of viral copies  (<250 copies / mL). A negative result must be combined with clinical  observations, patient history, and epidemiological information. If result is POSITIVE SARS-CoV-2 target nucleic acids are DETECTED.  The SARS-CoV-2 RNA is generally detectable in upper and lower  respiratory specimens during the  acute phase of infection.  Positive  results are indicative of active infection with SARS-CoV-2.  Clinical  correlation with patient history and other diagnostic information is  necessary to determine patient infection status.  Positive results do  not rule out bacterial infection or co-infection with other viruses. If result is PRESUMPTIVE POSTIVE SARS-CoV-2 nucleic acids MAY BE PRESENT.   A presumptive positive result was obtained on the submitted specimen  and confirmed on repeat testing.  While 2019 novel coronavirus  (SARS-CoV-2) nucleic acids may be present in the submitted sample  additional confirmatory testing may be necessary for epidemiological  and / or clinical management purposes  to differentiate between  SARS-CoV-2 and other Sarbecovirus currently known to infect humans.  If clinically indicated additional testing with an alternate test  methodology [phone]) i s advised. The SARS-CoV-2 RNA is generally  detectable in upper and lower respiratory specimens during the acute  phase of infection. The expected result is Negative. Fact Sheet for Patients:  StrictlyIdeas.no Fact Sheet for Healthcare Providers: BankingDealers.co.za This test is not yet approved or cleared by the [place] FDA and has been authorized for detection and/or diagnosis of SARS-CoV-2 by FDA under an  Emergency Use Authorization (EUA).  This EUA will remain in effect (meaning this test can be used) for the duration of the COVID-19 declaration under Section 564(b)(1) of the Act, 21 U.S.C. section 360bbb-3(b)(1), unless the authorization is terminated or revoked sooner. Performed at [facility], [place] [address], [place], [place] [address]   Blood Culture (routine x 2)     Status: None   Collection Time: 01/05/19  1:00 PM   Specimen: BLOOD  Result Value Ref Range Status   Specimen Description BLOOD SITE NOT SPECIFIED  Final   Special Requests   Final    BOTTLES DRAWN AEROBIC AND ANAEROBIC Blood Culture adequate volume   Culture   Final    NO GROWTH 5 DAYS Performed at [facility], [address] [place], [place] [address]    Report Status 01/10/2019 FINAL  Final  Blood Culture (routine x 2)     Status: None   Collection Time: 01/05/19  1:00 PM   Specimen: BLOOD  Result Value Ref Range Status   Specimen Description BLOOD SITE NOT SPECIFIED  Final   Special Requests   Final    BOTTLES DRAWN AEROBIC ONLY Blood Culture results may not be optimal due to an inadequate volume of blood received in culture bottles   Culture   Final    NO GROWTH 5 DAYS Performed at [facility], [place] [address], [place], [place] [address]    Report Status 01/10/2019 FINAL  Final  Blood Culture (routine x 2)     Status: None (Preliminary result)   Collection Time: 01/12/19 11:36 AM   Specimen: BLOOD  Result Value Ref Range Status   Specimen Description BLOOD RIGHT ANTECUBITAL  Final   Special Requests   Final    BOTTLES DRAWN AEROBIC AND ANAEROBIC Blood Culture results may not be optimal due to an inadequate volume of blood received in culture bottles   Culture   Final    NO GROWTH < 24 HOURS Performed at [facility], [place] [address], [place], [place] [address]    Report Status PENDING  Incomplete         Radiology Studies: Dg Chest Portable 1 View  Result Date:  01/12/2019 CLINICAL DATA:  Shortness of breath.  COVID positive. EXAM:  PORTABLE CHEST 1 VIEW COMPARISON:  January 05, 2019 FINDINGS: There is airspace consolidation throughout both lower lobe regions which appear similar on the right and slightly increased on the left compared to 1 week prior. There is new airspace opacity in the right upper lobe. There is stable scarring in the right upper lobe with a large focal bulla in the right upper lobe toward the apex. Left upper lobe is clear. There is mild cardiomegaly with pulmonary vascularity normal. No adenopathy. No bone lesions. IMPRESSION: Multifocal airspace opacity consistent with widespread pneumonia. Increase in opacity in the left lower lung region and new increased opacity in the right upper lobe. Stable opacity right lower lung region. Stable scarring and prominent bulla in the right upper lobe. Stable cardiac prominence.  No evident adenopathy. Electronically Signed   By: Lowella Grip III M.D.   On: 01/12/2019 11:52        Scheduled Meds: . albuterol  2 puff Inhalation Q6H  . atorvastatin  40 mg Oral Daily  . dexamethasone  6 mg Oral Q24H  . enoxaparin (LOVENOX) injection  75 mg Subcutaneous Q24H  . famotidine  20 mg Oral BID  . insulin aspart  0-9 Units Subcutaneous TID WC  . levothyroxine  100 mcg Oral QAC breakfast  . lisinopril  2.5 mg Oral Daily  . sodium chloride flush  3 mL Intravenous Q12H  . vitamin C  500 mg Oral Daily  . zinc sulfate  220 mg Oral Daily   Continuous Infusions: . remdesivir 100 mg in NS 250 mL 100 mg (01/13/19 0940)     LOS: 1 day   The patient is critically ill with multiple organ systems failure and requires high complexity decision making for assessment and support, frequent evaluation and titration of therapies, application of advanced monitoring technologies and extensive interpretation of multiple databases. Critical Care Time devoted to patient care services described in this note  Time spent:  40 minutes     WOODS, Geraldo Docker, MD [facility] Pager [phone]  If 7PM-7AM, please contact night-coverage [email or web address] Password [facility] 01/13/2019, 5:44 PM

## 2019-01-14 LAB — PROCALCITONIN: Procalcitonin: <0.1 ng/mL

## 2019-01-14 LAB — COMPREHENSIVE METABOLIC PANEL
ALT: 49 U/L — ABNORMAL HIGH (ref 0–44)
AST: 28 U/L (ref 15–41)
Albumin: 3 g/dL — ABNORMAL LOW (ref 3.5–5.0)
Alkaline Phosphatase: 49 U/L (ref 38–126)
Anion gap: 9 (ref 5–15)
BUN: 16 mg/dL (ref 8–23)
CO2: 21 mmol/L — ABNORMAL LOW (ref 22–32)
Calcium: 8.7 mg/dL — ABNORMAL LOW (ref 8.9–10.3)
Chloride: 104 mmol/L (ref 98–111)
Creatinine, Ser: 0.93 mg/dL (ref 0.61–1.24)
GFR calc Af Amer: >60 mL/min (ref >60)
GFR calc non Af Amer: >60 mL/min (ref >60)
Glucose, Bld: 112 mg/dL — ABNORMAL HIGH (ref 70–99)
Potassium: 4.9 mmol/L (ref 3.5–5.1)
Sodium: 134 mmol/L — ABNORMAL LOW (ref 135–145)
Total Bilirubin: 0.6 mg/dL (ref 0.3–1.2)
Total Protein: 6.7 g/dL (ref 6.5–8.1)

## 2019-01-14 LAB — LIPID PANEL
Cholesterol: 130 mg/dL (ref 0–200)
HDL: 37 mg/dL — ABNORMAL LOW (ref >40)
LDL Cholesterol: 76 mg/dL (ref 0–99)
Total CHOL/HDL Ratio: 3.5 RATIO
Triglycerides: 83 mg/dL (ref <150)
VLDL: 17 mg/dL (ref 0–40)

## 2019-01-14 LAB — CBC WITH DIFFERENTIAL/PLATELET
Abs Immature Granulocytes: 0.29 10*3/uL — ABNORMAL HIGH (ref 0.00–0.07)
Basophils Absolute: 0 10*3/uL (ref 0.0–0.1)
Basophils Relative: 0 %
Eosinophils Absolute: 0 10*3/uL (ref 0.0–0.5)
Eosinophils Relative: 0 %
HCT: 42.6 % (ref 39.0–52.0)
Hemoglobin: 13.8 g/dL (ref 13.0–17.0)
Immature Granulocytes: 3 %
Lymphocytes Relative: 9 %
Lymphs Abs: 0.9 10*3/uL (ref 0.7–4.0)
MCH: 29.6 pg (ref 26.0–34.0)
MCHC: 32.4 g/dL (ref 30.0–36.0)
MCV: 91.4 fL (ref 80.0–100.0)
Monocytes Absolute: 0.4 10*3/uL (ref 0.1–1.0)
Monocytes Relative: 3 %
Neutro Abs: 8.8 10*3/uL — ABNORMAL HIGH (ref 1.7–7.7)
Neutrophils Relative %: 85 %
Platelets: 212 10*3/uL (ref 150–400)
RBC: 4.66 MIL/uL (ref 4.22–5.81)
RDW: 15.2 % (ref 11.5–15.5)
WBC: 10.4 10*3/uL (ref 4.0–10.5)
nRBC: 0 % (ref 0.0–0.2)

## 2019-01-14 LAB — GLUCOSE, CAPILLARY
Glucose-Capillary: 75 mg/dL (ref 70–99)
Glucose-Capillary: 119 mg/dL — ABNORMAL HIGH (ref 70–99)
Glucose-Capillary: 119 mg/dL — ABNORMAL HIGH (ref 70–99)

## 2019-01-14 LAB — C-REACTIVE PROTEIN: CRP: <0.8 mg/dL (ref <1.0)

## 2019-01-14 LAB — FERRITIN: Ferritin: 779 ng/mL — ABNORMAL HIGH (ref 24–336)

## 2019-01-14 LAB — MAGNESIUM: Magnesium: 2.1 mg/dL (ref 1.7–2.4)

## 2019-01-14 LAB — PHOSPHORUS: Phosphorus: 3.1 mg/dL (ref 2.5–4.6)

## 2019-01-14 LAB — D-DIMER, QUANTITATIVE: D-Dimer, Quant: 14.76 ug/mL-FEU — ABNORMAL HIGH (ref 0.00–0.50)

## 2019-01-14 MED ORDER — LISINOPRIL 10 MG PO TABS
5.0000 mg | ORAL_TABLET | Freq: Every day | ORAL | Status: DC
  Administered 2019-01-15 – 2019-01-19 (×5): 5 mg via ORAL
  Filled 2019-01-14 (×5): qty 1

## 2019-01-14 MED ORDER — ATORVASTATIN CALCIUM 40 MG PO TABS
60.0000 mg | ORAL_TABLET | Freq: Every day | ORAL | Status: DC
  Administered 2019-01-15 – 2019-01-19 (×5): 60 mg via ORAL
  Filled 2019-01-14 (×5): qty 2

## 2019-01-14 MED ORDER — LISINOPRIL 2.5 MG PO TABS
2.5000 mg | ORAL_TABLET | Freq: Once | ORAL | Status: AC
  Administered 2019-01-14: 16:00:00 2.5 mg via ORAL
  Filled 2019-01-14: qty 1

## 2019-01-14 NOTE — Progress Notes (Signed)
PROGRESS NOTE    Larry Richmond  [id number] DOB: 07-03-1950 DOA: 01/12/2019 PCP: Default, Provider, MD   Brief Narrative:  68 y.o. BM PMHx HTN, diabetes type 2 controlled with complication, HLD, chronic respiratory failure with hypoxia, tobacco use disorder, COPD,OSA (noncompliant), morbid obesity, s/p thyroidectomy for goiter, and GERD, medically noncompliant  who presents with complaints of persistent cough and shortness of breath.  Patient was just hospitalized from 10/23-10/29 with pneumonia secondary to COVID-19.  During his hospitalization he received 5 days of remdesivir, plasma, Actemra, and steroids.  He had been initially started on empiric antibiotics of cefepime as  procalcitonin was initially noted to be elevated, but was discharged home to complete a 10-day course of Levaquin.  At the time of discharge he did not require oxygen.  However, after getting home patient reported having worsening cough being unable to sleep throughout the night.  Denies having any significant fever, headache, nausea, vomiting, or diarrhea symptoms at this time.  He was unable to collect any significant distance due to cough and worsening shortness of breath.   ED Course: Upon admission into the emergency department patient was seen to be afebrile, respirations 26-29, O2 saturations 92 to 97% on 2 L nasal cannula oxygen, and all other vital signs maintained.  Labs were relatively unremarkable except for lactic acid 2.1.  Chest x-ray showed worsening multifocal pneumonia.  Patient was given Tylenol 1000 mg, vancomycin, cefepime, and Reglan.   Subjective: 11/1 A/O x4, positive S OB, negative abdominal pain, negative N/V    Assessment & Plan:   Principal Problem:   Pneumonia due to COVID-19 virus Active Problems:   Tobacco use disorder   Acute on chronic respiratory failure with hypoxia (HCC)   Hypothyroidism   Morbid obesity (HCC)   HLD (hyperlipidemia)   Lactic acidosis   Diabetes mellitus  type 2, controlled ([place])   Benign essential HTN   COPD (chronic obstructive pulmonary disease) (HCC)   OSA on CPAP   Medically noncompliant   Pneumonia due to Covid 19/acute on chronic respiratory failure with hypoxia -Decadron 6 mg daily -Remdesivir completed on 10/27  -Repeat Remdesivir per pharmacy  COVID-19 Labs  Recent Labs    01/12/19 1131 01/13/19 0625 01/14/19 0133  DDIMER 17.54* >20.00* 14.76*  FERRITIN 910* 944* 779*  LDH 359*  --   --   CRP <0.8 <0.8 <0.8    Lab Results  Component Value Date   SARSCOV2NAA POSITIVE (A) 01/05/2019   Tobacco use disorder -Patient smokes 1 pack every 3 days up until he contracted Covid.  We spoke at length about absolute need to discontinue smoking.. -Declined nicotine patch at this time.  COPD -Understands secondary to COPD + Covid pneumonia may have to go home on O2 for significant amount of time. -Will require spirometry pre-/post bronchodilation with DLCO after he fully recovered  OSA -Noncompliant with CPAP at home per daughter -Counseled patient on the importance of the use of CPAP.  Agreed to see his PCP upon discharge to set up sleep study -See Covid pneumonia  Essential HTN -11/1 increase lisinopril 5 mg daily  Diabetes type 2 controlled with complication -[id number] hemoglobin A1c= 6.0 -Metformin held -Sensitive SSI  HLD -11/1 LDL = 76  -Increase Lipitor 60 mg daily  Hypothyroidism -Synthroid 100 mcg daily  Morbid obesity -Carb modified diet    DVT prophylaxis: Covid protocol Lovenox Code Status: Full Family Communication: 11/1 spoke with [name] (daughter) counseled her on plan of care answered all questions Disposition Plan:  TBD   Consultants:    Procedures/Significant Events:  10/30 PCXR:-Multifocal airspace opacity consistent with widespread pneumonia.Increase in opacity in the left lower lung region and new increased opacity in the right upper lobe.  -Stable opacity right lower lung region.  -stable scarring and prominent bulla in the right upper lobe.     I have personally reviewed and interpreted all radiology studies and my findings are as above.  VENTILATOR SETTINGS:    Cultures 10/30 blood RIGHT antecubital NGTD  10/31 respiratory virus panel pending 10/31 sputum pending    Antimicrobials: Anti-infectives (From admission, onward)   Start     Stop   01/13/19 1000  remdesivir 100 mg in sodium chloride 0.9 % 250 mL IVPB     01/18/19 0959   01/12/19 1245  vancomycin (VANCOCIN) IVPB 1000 mg/200 mL premix     01/12/19 1453   01/12/19 1245  ceFEPIme (MAXIPIME) 2 g in sodium chloride 0.9 % 100 mL IVPB     01/12/19 1707       Devices    LINES / TUBES:      Continuous Infusions: . remdesivir 100 mg in NS 250 mL 100 mg (01/14/19 0811)     Objective: Vitals:   01/13/19 1528 01/13/19 1900 01/14/19 0324 01/14/19 0703  BP: (!) 157/83 (!) 134/91 (!) 152/89 128/89  Pulse: 75 72 (!) 57 68  Resp: 20  (!) 21 19  Temp: 97.7 F (36.5 C) 98.6 F (37 C) 97.9 F (36.6 C) 97.7 F (36.5 C)  TempSrc: Oral Oral Oral Oral  SpO2: 96% 97% 97% 98%  Weight:      Height:        Intake/Output Summary (Last 24 hours) at 01/14/2019 [id number] Last data filed at 01/13/2019 1500 Gross per 24 hour  Intake 250 ml  Output -  Net 250 ml   Filed Weights   01/13/19 0019  Weight: (!) 152.5 kg   Physical Exam:  General: A/O x4, positive acute respiratory distress Eyes: negative scleral hemorrhage, negative anisocoria, negative icterus ENT: Negative Runny nose, negative gingival bleeding, Neck:  Negative scars, masses, torticollis, lymphadenopathy, JVD Lungs: Clear to auscultation bilaterally without wheezes or crackles Cardiovascular: Regular rate and rhythm without murmur gallop or rub normal S1 and S2 Abdomen: negative abdominal pain, nondistended, positive soft, bowel sounds, no rebound, no ascites, no appreciable mass Extremities: No significant cyanosis, clubbing,  or edema bilateral lower extremities Skin: Negative rashes, lesions, ulcers Psychiatric:  Negative depression, negative anxiety, negative fatigue, negative mania  Central nervous system:  Cranial nerves II through XII intact, tongue/uvula midline, all extremities muscle strength 5/5, sensation intact throughout, negative dysarthria, negative expressive aphasia, negative receptive aphasia.  .     Data Reviewed: Care during the described time interval was provided by me .  I have reviewed this patient's available data, including medical history, events of note, physical examination, and all test results as part of my evaluation.   CBC: Recent Labs  Lab 01/09/19 0115 01/10/19 0505 01/12/19 1131 01/13/19 0625 01/14/19 0133  WBC 3.9* 4.3 7.4 15.0* 10.4  NEUTROABS  --   --  5.6 12.6* 8.8*  HGB 14.0 12.9* 14.9 14.8 13.8  HCT 44.2 40.7 44.8 45.2 42.6  MCV 92.9 91.5 89.8 91.1 91.4  PLT 174 232 213 215 [id number]   Basic Metabolic Panel: Recent Labs  Lab 01/10/19 0505 01/11/19 0455 01/12/19 1131 01/13/19 0625 01/14/19 0133  NA 135 134* 138 133* 134*  K 4.5 4.7 4.2 4.0 4.9  CL 102 100 104 102 104  CO2 24 25 23 22  21*  GLUCOSE 121* 125* 89 118* 112*  BUN 24* 27* 21 20 16   CREATININE 1.08 1.09 1.13 0.98 0.93  CALCIUM 8.3* 8.5* 8.4* 8.4* 8.7*  MG  --   --   --  2.0 2.1  PHOS  --   --   --  2.7 3.1   GFR: Estimated Creatinine Clearance: 115.3 mL/min (by C-G formula based on SCr of 0.93 mg/dL). Liver Function Tests: Recent Labs  Lab 01/09/19 0115 01/10/19 0505 01/12/19 1131 01/13/19 0625 01/14/19 0133  AST 43* 28 45* 32 28  ALT 49* 39 55* 53* 49*  ALKPHOS 56 51 52 56 49  BILITOT 0.3 0.5 0.5 0.9 0.6  PROT 6.8 6.3* 6.4* 7.1 6.7  ALBUMIN 2.8* 2.6* 2.7* 3.1* 3.0*   No results for input(s): LIPASE, AMYLASE in the last 168 hours. No results for input(s): AMMONIA in the last 168 hours. Coagulation Profile: No results for input(s): INR, PROTIME in the last 168 hours. Cardiac  Enzymes: No results for input(s): CKTOTAL, CKMB, CKMBINDEX, TROPONINI in the last 168 hours. BNP (last 3 results) No results for input(s): PROBNP in the last 8760 hours. HbA1C: No results for input(s): HGBA1C in the last 72 hours. CBG: Recent Labs  Lab 01/13/19 0827 01/13/19 1115 01/13/19 1526 01/13/19 2023 01/14/19 0801  GLUCAP 151* 145* 128* 120* 75   Lipid Profile: Recent Labs    01/12/19 1131 01/14/19 0133  CHOL  --  130  HDL  --  37*  LDLCALC  --  76  TRIG 153* 83  CHOLHDL  --  3.5   Thyroid Function Tests: No results for input(s): TSH, T4TOTAL, FREET4, T3FREE, THYROIDAB in the last 72 hours. Anemia Panel: Recent Labs    01/13/19 0625 01/14/19 0133  FERRITIN 944* 779*   Urine analysis:    Component Value Date/Time   COLORURINE AMBER (A) 01/02/2019 1252   APPEARANCEUR HAZY (A) 01/02/2019 1252   LABSPEC 1.025 01/02/2019 1252   PHURINE 5.0 01/02/2019 1252   GLUCOSEU NEGATIVE 01/02/2019 1252   HGBUR NEGATIVE 01/02/2019 1252   BILIRUBINUR NEGATIVE 01/02/2019 1252   KETONESUR 5 (A) 01/02/2019 1252   PROTEINUR 30 (A) 01/02/2019 1252   NITRITE NEGATIVE 01/02/2019 1252   LEUKOCYTESUR NEGATIVE 01/02/2019 1252   Sepsis Labs: @LABRCNTIP (procalcitonin:4,lacticidven:4)  ) Recent Results (from the past 240 hour(s))  SARS Coronavirus 2 by RT PCR (hospital order, performed in [place] hospital lab) Nasopharyngeal Nasopharyngeal Swab     Status: Abnormal   Collection Time: 01/05/19 11:46 AM   Specimen: Nasopharyngeal Swab  Result Value Ref Range Status   SARS Coronavirus 2 POSITIVE (A) NEGATIVE Final    Comment: RESULT CALLED TO, READ BACK BY AND VERIFIED WITH: Bethena Midget RN 14:10 01/05/19 ([email or web address]) (NOTE) If result is NEGATIVE SARS-CoV-2 target nucleic acids are NOT DETECTED. The SARS-CoV-2 RNA is generally detectable in upper and lower  respiratory specimens during the acute phase of infection. The lowest  concentration of SARS-CoV-2 viral copies this assay  can detect is 250  copies / mL. A negative result does not preclude SARS-CoV-2 infection  and should not be used as the sole basis for treatment or other  patient management decisions.  A negative result may occur with  improper specimen collection / handling, submission of specimen other  than nasopharyngeal swab, presence of viral mutation(s) within the  areas targeted by this assay, and inadequate number of viral copies  (<250 copies /  mL). A negative result must be combined with clinical  observations, patient history, and epidemiological information. If result is POSITIVE SARS-CoV-2 target nucleic acids are DETECTED.  The SARS-CoV-2 RNA is generally detectable in upper and lower  respiratory specimens during the acute phase of infection.  Positive  results are indicative of active infection with SARS-CoV-2.  Clinical  correlation with patient history and other diagnostic information is  necessary to determine patient infection status.  Positive results do  not rule out bacterial infection or co-infection with other viruses. If result is PRESUMPTIVE POSTIVE SARS-CoV-2 nucleic acids MAY BE PRESENT.   A presumptive positive result was obtained on the submitted specimen  and confirmed on repeat testing.  While 2019 novel coronavirus  (SARS-CoV-2) nucleic acids may be present in the submitted sample  additional confirmatory testing may be necessary for epidemiological  and / or clinical management purposes  to differentiate between  SARS-CoV-2 and other Sarbecovirus currently known to infect humans.  If clinically indicated additional testing with an alternate test  methodology [phone]) i s advised. The SARS-CoV-2 RNA is generally  detectable in upper and lower respiratory specimens during the acute  phase of infection. The expected result is Negative. Fact Sheet for Patients:  StrictlyIdeas.no Fact Sheet for Healthcare Providers:  BankingDealers.co.za This test is not yet approved or cleared by the [place] FDA and has been authorized for detection and/or diagnosis of SARS-CoV-2 by FDA under an Emergency Use Authorization (EUA).  This EUA will remain in effect (meaning this test can be used) for the duration of the COVID-19 declaration under Section 564(b)(1) of the Act, 21 U.S.C. section 360bbb-3(b)(1), unless the authorization is terminated or revoked sooner. Performed at [facility], [place] [address], [place], [place] [address]   Blood Culture (routine x 2)     Status: None   Collection Time: 01/05/19  1:00 PM   Specimen: BLOOD  Result Value Ref Range Status   Specimen Description BLOOD SITE NOT SPECIFIED  Final   Special Requests   Final    BOTTLES DRAWN AEROBIC AND ANAEROBIC Blood Culture adequate volume   Culture   Final    NO GROWTH 5 DAYS Performed at [facility], [address], [place], [place] [address]    Report Status 01/10/2019 FINAL  Final  Blood Culture (routine x 2)     Status: None   Collection Time: 01/05/19  1:00 PM   Specimen: BLOOD  Result Value Ref Range Status   Specimen Description BLOOD SITE NOT SPECIFIED  Final   Special Requests   Final    BOTTLES DRAWN AEROBIC ONLY Blood Culture results may not be optimal due to an inadequate volume of blood received in culture bottles   Culture   Final    NO GROWTH 5 DAYS Performed at [facility], [place] [address] [place],  [address]    Report Status 01/10/2019 FINAL  Final  Blood Culture (routine x 2)     Status: None (Preliminary result)   Collection Time: 01/12/19 11:36 AM   Specimen: BLOOD  Result Value Ref Range Status   Specimen Description BLOOD RIGHT ANTECUBITAL  Final   Special Requests   Final    BOTTLES DRAWN AEROBIC AND ANAEROBIC Blood Culture results may not be optimal due to an inadequate volume of blood received in culture bottles   Culture   Final    NO GROWTH 2 DAYS  Performed at [facility], [place]  [address] [place], [place] [address]    Report Status PENDING  Incomplete         Radiology Studies: Dg Chest Portable 1 View  Result Date: 01/12/2019 CLINICAL DATA:  Shortness of breath.  COVID positive. EXAM: PORTABLE CHEST 1 VIEW COMPARISON:  January 05, 2019 FINDINGS: There is airspace consolidation throughout both lower lobe regions which appear similar on the right and slightly increased on the left compared to 1 week prior. There is new airspace opacity in the right upper lobe. There is stable scarring in the right upper lobe with a large focal bulla in the right upper lobe toward the apex. Left upper lobe is clear. There is mild cardiomegaly with pulmonary vascularity normal. No adenopathy. No bone lesions. IMPRESSION: Multifocal airspace opacity consistent with widespread pneumonia. Increase in opacity in the left lower lung region and new increased opacity in the right upper lobe. Stable opacity right lower lung region. Stable scarring and prominent bulla in the right upper lobe. Stable cardiac prominence.  No evident adenopathy. Electronically Signed   By: Lowella Grip III M.D.   On: 01/12/2019 11:52        Scheduled Meds: . albuterol  2 puff Inhalation Q6H  . atorvastatin  40 mg Oral Daily  . dexamethasone  6 mg Oral Q24H  . enoxaparin (LOVENOX) injection  75 mg Subcutaneous Q24H  . famotidine  20 mg Oral BID  . insulin aspart  0-9 Units Subcutaneous TID WC  . levothyroxine  100 mcg Oral QAC breakfast  . lisinopril  2.5 mg Oral Daily  . sodium chloride flush  3 mL Intravenous Q12H  . vitamin C  500 mg Oral Daily  . zinc sulfate  220 mg Oral Daily   Continuous Infusions: . remdesivir 100 mg in NS 250 mL 100 mg (01/14/19 0811)     LOS: 2 days   The patient is critically ill with multiple organ systems failure and requires high complexity decision making for assessment and support, frequent evaluation and titration of  therapies, application of advanced monitoring technologies and extensive interpretation of multiple databases. Critical Care Time devoted to patient care services described in this note  Time spent: 40 minutes     Jaideep Pollack, Geraldo Docker, MD [facility] Pager [phone]  If 7PM-7AM, please contact night-coverage [email or web address] Password [email or web address] 01/14/2019, 8:29 AM

## 2019-01-15 LAB — D-DIMER, QUANTITATIVE: D-Dimer, Quant: 8.14 ug/mL-FEU — ABNORMAL HIGH (ref 0.00–0.50)

## 2019-01-15 LAB — FERRITIN: Ferritin: 768 ng/mL — ABNORMAL HIGH (ref 24–336)

## 2019-01-15 LAB — COMPREHENSIVE METABOLIC PANEL
ALT: 52 U/L — ABNORMAL HIGH (ref 0–44)
AST: 27 U/L (ref 15–41)
Albumin: 3.1 g/dL — ABNORMAL LOW (ref 3.5–5.0)
Alkaline Phosphatase: 49 U/L (ref 38–126)
Anion gap: 8 (ref 5–15)
BUN: 23 mg/dL (ref 8–23)
CO2: 22 mmol/L (ref 22–32)
Calcium: 8.8 mg/dL — ABNORMAL LOW (ref 8.9–10.3)
Chloride: 101 mmol/L (ref 98–111)
Creatinine, Ser: 1.03 mg/dL (ref 0.61–1.24)
GFR calc Af Amer: >60 mL/min (ref >60)
GFR calc non Af Amer: >60 mL/min (ref >60)
Glucose, Bld: 97 mg/dL (ref 70–99)
Potassium: 5.2 mmol/L — ABNORMAL HIGH (ref 3.5–5.1)
Sodium: 131 mmol/L — ABNORMAL LOW (ref 135–145)
Total Bilirubin: 0.7 mg/dL (ref 0.3–1.2)
Total Protein: 6.4 g/dL — ABNORMAL LOW (ref 6.5–8.1)

## 2019-01-15 LAB — CBC WITH DIFFERENTIAL/PLATELET
Abs Immature Granulocytes: 0.21 10*3/uL — ABNORMAL HIGH (ref 0.00–0.07)
Basophils Absolute: 0 10*3/uL (ref 0.0–0.1)
Basophils Relative: 0 %
Eosinophils Absolute: 0 10*3/uL (ref 0.0–0.5)
Eosinophils Relative: 0 %
HCT: 40.2 % (ref 39.0–52.0)
Hemoglobin: 12.9 g/dL — ABNORMAL LOW (ref 13.0–17.0)
Immature Granulocytes: 2 %
Lymphocytes Relative: 12 %
Lymphs Abs: 1.1 10*3/uL (ref 0.7–4.0)
MCH: 29.4 pg (ref 26.0–34.0)
MCHC: 32.1 g/dL (ref 30.0–36.0)
MCV: 91.6 fL (ref 80.0–100.0)
Monocytes Absolute: 0.5 10*3/uL (ref 0.1–1.0)
Monocytes Relative: 5 %
Neutro Abs: 7.6 10*3/uL (ref 1.7–7.7)
Neutrophils Relative %: 81 %
Platelets: 222 10*3/uL (ref 150–400)
RBC: 4.39 MIL/uL (ref 4.22–5.81)
RDW: 15.3 % (ref 11.5–15.5)
WBC: 9.4 10*3/uL (ref 4.0–10.5)
nRBC: 0 % (ref 0.0–0.2)

## 2019-01-15 LAB — GLUCOSE, CAPILLARY
Glucose-Capillary: 119 mg/dL — ABNORMAL HIGH (ref 70–99)
Glucose-Capillary: 80 mg/dL (ref 70–99)
Glucose-Capillary: 99 mg/dL (ref 70–99)
Glucose-Capillary: 94 mg/dL (ref 70–99)
Glucose-Capillary: 113 mg/dL — ABNORMAL HIGH (ref 70–99)

## 2019-01-15 LAB — C-REACTIVE PROTEIN: CRP: <0.8 mg/dL (ref <1.0)

## 2019-01-15 LAB — PROCALCITONIN: Procalcitonin: <0.1 ng/mL

## 2019-01-15 LAB — MAGNESIUM: Magnesium: 2 mg/dL (ref 1.7–2.4)

## 2019-01-15 LAB — PHOSPHORUS: Phosphorus: 3.8 mg/dL (ref 2.5–4.6)

## 2019-01-15 MED ORDER — SODIUM POLYSTYRENE SULFONATE 15 GM/60ML PO SUSP
30.0000 g | Freq: Two times a day (BID) | ORAL | Status: AC
  Administered 2019-01-15 (×2): 30 g via ORAL
  Filled 2019-01-15 (×2): qty 120

## 2019-01-15 NOTE — Plan of Care (Addendum)
Patient up to chair, all medication given well tolerated. No s/s of pain or distress. Spoke to daughter [name] with an update on the plan of care. Will continue to monitor for remainder of shift.    Problem: Education: Goal: Knowledge of risk factors and measures for prevention of condition will improve 01/15/2019 1058 by Orvan Falconer, RN Outcome: Progressing 01/15/2019 1058 by Orvan Falconer, RN Outcome: Progressing   Problem: Coping: Goal: Psychosocial and spiritual needs will be supported 01/15/2019 1058 by Orvan Falconer, RN Outcome: Progressing 01/15/2019 1058 by Orvan Falconer, RN Outcome: Progressing   Problem: Respiratory: Goal: Will maintain a patent airway 01/15/2019 1058 by Orvan Falconer, RN Outcome: Progressing 01/15/2019 1058 by Orvan Falconer, RN Outcome: Progressing Goal: Complications related to the disease process, condition or treatment will be avoided or minimized 01/15/2019 1058 by Orvan Falconer, RN Outcome: Progressing 01/15/2019 1058 by Orvan Falconer, RN Outcome: Progressing   Problem: Education: Goal: Knowledge of General Education information will improve Description: Including pain rating scale, medication(s)/side effects and non-pharmacologic comfort measures 01/15/2019 1058 by Orvan Falconer, RN Outcome: Progressing 01/15/2019 1058 by Orvan Falconer, RN Outcome: Progressing   Problem: Health Behavior/Discharge Planning: Goal: Ability to manage health-related needs will improve 01/15/2019 1058 by Orvan Falconer, RN Outcome: Progressing 01/15/2019 1058 by Orvan Falconer, RN Outcome: Progressing   Problem: Clinical Measurements: Goal: Ability to maintain clinical measurements within normal limits will improve 01/15/2019 1058 by Orvan Falconer, RN Outcome: Progressing 01/15/2019 1058 by Orvan Falconer, RN Outcome: Progressing Goal: Will remain free from infection 01/15/2019 1058 by Orvan Falconer, RN Outcome: Progressing 01/15/2019 1058 by Orvan Falconer, RN Outcome: Progressing Goal: Diagnostic test results will improve 01/15/2019 1058 by Orvan Falconer, RN Outcome: Progressing 01/15/2019 1058 by Orvan Falconer, RN Outcome: Progressing Goal: Respiratory complications will improve 01/15/2019 1058 by Orvan Falconer, RN Outcome: Progressing 01/15/2019 1058 by Orvan Falconer, RN Outcome: Progressing Goal: Cardiovascular complication will be avoided 01/15/2019 1058 by Orvan Falconer, RN Outcome: Progressing 01/15/2019 1058 by Orvan Falconer, RN Outcome: Progressing   Problem: Activity: Goal: Risk for activity intolerance will decrease 01/15/2019 1058 by Orvan Falconer, RN Outcome: Progressing 01/15/2019 1058 by Orvan Falconer, RN Outcome: Progressing   Problem: Nutrition: Goal: Adequate nutrition will be maintained 01/15/2019 1058 by Orvan Falconer, RN Outcome: Progressing 01/15/2019 1058 by Orvan Falconer, RN Outcome: Progressing   Problem: Coping: Goal: Level of anxiety will decrease 01/15/2019 1058 by Orvan Falconer, RN Outcome: Progressing 01/15/2019 1058 by Orvan Falconer, RN Outcome: Progressing   Problem: Elimination: Goal: Will not experience complications related to bowel motility 01/15/2019 1058 by Orvan Falconer, RN Outcome: Progressing 01/15/2019 1058 by Orvan Falconer, RN Outcome: Progressing Goal: Will not experience complications related to urinary retention 01/15/2019 1058 by Orvan Falconer, RN Outcome: Progressing 01/15/2019 1058 by Orvan Falconer, RN Outcome: Progressing   Problem: Pain Managment: Goal: General experience of comfort will improve 01/15/2019 1058 by Orvan Falconer, RN Outcome: Progressing 01/15/2019 1058 by Orvan Falconer, RN Outcome: Progressing   Problem: Safety: Goal: Ability to remain free from injury will improve 01/15/2019 1058 by Orvan Falconer,  RN Outcome: Progressing 01/15/2019 1058 by Orvan Falconer, RN Outcome: Progressing   Problem: Skin Integrity: Goal: Risk for impaired skin integrity will decrease 01/15/2019 1058 by Orvan Falconer, RN Outcome: Progressing 01/15/2019 1058 by Orvan Falconer, RN Outcome:  Progressing

## 2019-01-15 NOTE — Plan of Care (Signed)

## 2019-01-15 NOTE — Progress Notes (Addendum)
PROGRESS NOTE    Larry Richmond  [id number] DOB: 08/05/1950 DOA: 01/12/2019 PCP: Default, Provider, MD   Brief Narrative:  68 y.o. BM PMHx HTN, diabetes type 2 controlled with complication, HLD, chronic respiratory failure with hypoxia, tobacco use disorder, COPD,OSA (noncompliant), morbid obesity, s/p thyroidectomy for goiter, and GERD, medically noncompliant  who presents with complaints of persistent cough and shortness of breath.  Patient was just hospitalized from 10/23-10/29 with pneumonia secondary to COVID-19.  During his hospitalization he received 5 days of remdesivir, plasma, Actemra, and steroids.  He had been initially started on empiric antibiotics of cefepime as  procalcitonin was initially noted to be elevated, but was discharged home to complete a 10-day course of Levaquin.  At the time of discharge he did not require oxygen.  However, after getting home patient reported having worsening cough being unable to sleep throughout the night.  Denies having any significant fever, headache, nausea, vomiting, or diarrhea symptoms at this time.  He was unable to collect any significant distance due to cough and worsening shortness of breath.   ED Course: Upon admission into the emergency department patient was seen to be afebrile, respirations 26-29, O2 saturations 92 to 97% on 2 L nasal cannula oxygen, and all other vital signs maintained.  Labs were relatively unremarkable except for lactic acid 2.1.  Chest x-ray showed worsening multifocal pneumonia.  Patient was given Tylenol 1000 mg, vancomycin, cefepime, and Reglan.   Subjective: 11/2 afebrile last 24 hours, positive S OB but significantly improved, negative chest pain     Assessment & Plan:   Principal Problem:   Pneumonia due to COVID-19 virus Active Problems:   Tobacco use disorder   Acute on chronic respiratory failure with hypoxia (HCC)   Hypothyroidism   Morbid obesity (HCC)   HLD (hyperlipidemia)   Lactic  acidosis   Diabetes mellitus type 2, controlled ([place])   Benign essential HTN   COPD (chronic obstructive pulmonary disease) (HCC)   OSA on CPAP   Medically noncompliant   Pneumonia due to Covid 19/acute on chronic respiratory failure with hypoxia -Decadron 6 mg daily -Remdesivir completed on 10/27  -Repeat Remdesivir per pharmacy  COVID-19 Labs  Recent Labs    01/12/19 1131 01/13/19 0625 01/14/19 0133 01/15/19 0415  DDIMER 17.54* >20.00* 14.76* 8.14*  FERRITIN 910* 944* 779* 768*  LDH 359*  --   --   --   CRP <0.8 <0.8 <0.8 <0.8    Lab Results  Component Value Date   SARSCOV2NAA POSITIVE (A) 01/05/2019   Tobacco use disorder -Patient smokes 1 pack every 3 days up until he contracted Covid.  We spoke at length about absolute need to discontinue smoking.. -Declined nicotine patch at this time.  COPD -Understands secondary to COPD + Covid pneumonia may have to go home on O2 for significant amount of time. -Will require spirometry pre-/post bronchodilation with DLCO after he fully recovered  OSA -Noncompliant with CPAP at home per daughter -Counseled patient on the importance of the use of CPAP.  Agreed to see his PCP upon discharge to set up sleep study -See Covid pneumonia  Essential HTN -11/1 increase lisinopril 5 mg daily  Diabetes type 2 controlled with complication -[id number] hemoglobin A1c= 6.0 -Metformin held -Sensitive SSI  HLD -11/1 LDL = 76  -Increase Lipitor 60 mg daily  Hypothyroidism -Synthroid 100 mcg daily  Morbid obesity -Carb modified diet  Hyperkalemia -Kayexalate 30 g x 2 doses     DVT prophylaxis: Covid protocol Lovenox  Code Status: Full Family Communication: 11/1 spoke with [name] (daughter) counseled her on plan of care answered all questions Disposition Plan: TBD   Consultants:    Procedures/Significant Events:  10/30 PCXR:-Multifocal airspace opacity consistent with widespread pneumonia.Increase in opacity in the left lower  lung region and new increased opacity in the right upper lobe.  -Stable opacity right lower lung region. -stable scarring and prominent bulla in the right upper lobe.     I have personally reviewed and interpreted all radiology studies and my findings are as above.  VENTILATOR SETTINGS: Nasal cannula 2 L/min SPO2 95%   Cultures 10/23 SARS coronavirus positive 10/30 blood RIGHT antecubital NGTD  10/31 respiratory virus panel pending 10/31 sputum pending    Antimicrobials: Anti-infectives (From admission, onward)   Start     Stop   01/13/19 1000  remdesivir 100 mg in sodium chloride 0.9 % 250 mL IVPB     01/18/19 0959   01/12/19 1245  vancomycin (VANCOCIN) IVPB 1000 mg/200 mL premix     01/12/19 1453   01/12/19 1245  ceFEPIme (MAXIPIME) 2 g in sodium chloride 0.9 % 100 mL IVPB     01/12/19 1707       Devices    LINES / TUBES:      Continuous Infusions: . remdesivir 100 mg in NS 250 mL Stopped (01/14/19 1258)     Objective: Vitals:   01/14/19 1057 01/14/19 1506 01/14/19 1917 01/15/19 0357  BP: (!) 142/93 (!) 157/87 (!) 145/62 (!) 140/92  Pulse: 76 75 74 69  Resp: 20 17 19 17   Temp: 98.1 F (36.7 C) 98.3 F (36.8 C) 98.5 F (36.9 C) 97.8 F (36.6 C)  TempSrc: Oral Oral Oral Oral  SpO2: 94% 93% 94% 100%  Weight:      Height:        Intake/Output Summary (Last 24 hours) at 01/15/2019 [id number] Last data filed at 01/14/2019 1800 Gross per 24 hour  Intake 850 ml  Output -  Net 850 ml   Filed Weights   01/13/19 0019  Weight: (!) 152.5 kg    Physical Exam:  General: A/O x4, positive acute respiratory distress Eyes: negative scleral hemorrhage, negative anisocoria, negative icterus\ ENT: Negative Runny nose, negative gingival bleeding, Neck:  Negative scars, masses, torticollis, lymphadenopathy, JVD Lungs: Clear to auscultation bilaterally without wheezes or crackles positive Cardiovascular: Regular rate and rhythm without murmur gallop or rub  normal S1 and S2 Abdomen: negative abdominal pain, nondistended, positive soft, bowel sounds, no rebound, no ascites, no appreciable mass Extremities: No significant cyanosis, clubbing, or edema bilateral lower extremities Skin: Negative rashes, lesions, ulcers Psychiatric:  Negative depression, negative anxiety, negative fatigue, negative mania  Central nervous system:  Cranial nerves II through XII intact, tongue/uvula midline, all extremities muscle strength 5/5, sensation intact throughout, negative dysarthria, negative expressive aphasia, negative receptive aphasia.  .     Data Reviewed: Care during the described time interval was provided by me .  I have reviewed this patient's available data, including medical history, events of note, physical examination, and all test results as part of my evaluation.   CBC: Recent Labs  Lab 01/10/19 0505 01/12/19 1131 01/13/19 0625 01/14/19 0133 01/15/19 0415  WBC 4.3 7.4 15.0* 10.4 9.4  NEUTROABS  --  5.6 12.6* 8.8* 7.6  HGB 12.9* 14.9 14.8 13.8 12.9*  HCT 40.7 44.8 45.2 42.6 40.2  MCV 91.5 89.8 91.1 91.4 91.6  PLT 232 213 215 212 [id number]   Basic Metabolic Panel: Recent  Labs  Lab 01/11/19 0455 01/12/19 1131 01/13/19 0625 01/14/19 0133 01/15/19 0415  NA 134* 138 133* 134* 131*  K 4.7 4.2 4.0 4.9 5.2*  CL 100 104 102 104 101  CO2 25 23 22  21* 22  GLUCOSE 125* 89 118* 112* 97  BUN 27* 21 20 16 23   CREATININE 1.09 1.13 0.98 0.93 1.03  CALCIUM 8.5* 8.4* 8.4* 8.7* 8.8*  MG  --   --  2.0 2.1 2.0  PHOS  --   --  2.7 3.1 3.8   GFR: Estimated Creatinine Clearance: 104.1 mL/min (by C-G formula based on SCr of 1.03 mg/dL). Liver Function Tests: Recent Labs  Lab 01/10/19 0505 01/12/19 1131 01/13/19 0625 01/14/19 0133 01/15/19 0415  AST 28 45* 32 28 27  ALT 39 55* 53* 49* 52*  ALKPHOS 51 52 56 49 49  BILITOT 0.5 0.5 0.9 0.6 0.7  PROT 6.3* 6.4* 7.1 6.7 6.4*  ALBUMIN 2.6* 2.7* 3.1* 3.0* 3.1*   No results for input(s): LIPASE,  AMYLASE in the last 168 hours. No results for input(s): AMMONIA in the last 168 hours. Coagulation Profile: No results for input(s): INR, PROTIME in the last 168 hours. Cardiac Enzymes: No results for input(s): CKTOTAL, CKMB, CKMBINDEX, TROPONINI in the last 168 hours. BNP (last 3 results) No results for input(s): PROBNP in the last 8760 hours. HbA1C: No results for input(s): HGBA1C in the last 72 hours. CBG: Recent Labs  Lab 01/13/19 1526 01/13/19 2023 01/14/19 0801 01/14/19 1059 01/14/19 2005  GLUCAP 128* 120* 75 119* 119*   Lipid Profile: Recent Labs    01/12/19 1131 01/14/19 0133  CHOL  --  130  HDL  --  37*  LDLCALC  --  76  TRIG 153* 83  CHOLHDL  --  3.5   Thyroid Function Tests: No results for input(s): TSH, T4TOTAL, FREET4, T3FREE, THYROIDAB in the last 72 hours. Anemia Panel: Recent Labs    01/14/19 0133 01/15/19 0415  FERRITIN 779* 768*   Urine analysis:    Component Value Date/Time   COLORURINE AMBER (A) 01/02/2019 1252   APPEARANCEUR HAZY (A) 01/02/2019 1252   LABSPEC 1.025 01/02/2019 1252   PHURINE 5.0 01/02/2019 1252   GLUCOSEU NEGATIVE 01/02/2019 1252   HGBUR NEGATIVE 01/02/2019 1252   BILIRUBINUR NEGATIVE 01/02/2019 1252   KETONESUR 5 (A) 01/02/2019 1252   PROTEINUR 30 (A) 01/02/2019 1252   NITRITE NEGATIVE 01/02/2019 1252   LEUKOCYTESUR NEGATIVE 01/02/2019 1252   Sepsis Labs: @LABRCNTIP (procalcitonin:4,lacticidven:4)  ) Recent Results (from the past 240 hour(s))  SARS Coronavirus 2 by RT PCR (hospital order, performed in [facility] hospital lab) Nasopharyngeal Nasopharyngeal Swab     Status: Abnormal   Collection Time: 01/05/19 11:46 AM   Specimen: Nasopharyngeal Swab  Result Value Ref Range Status   SARS Coronavirus 2 POSITIVE (A) NEGATIVE Final    Comment: RESULT CALLED TO, READ BACK BY AND VERIFIED WITH: Bethena Midget RN 14:10 01/05/19 ([email or web address]) (NOTE) If result is NEGATIVE SARS-CoV-2 target nucleic acids are NOT DETECTED. The  SARS-CoV-2 RNA is generally detectable in upper and lower  respiratory specimens during the acute phase of infection. The lowest  concentration of SARS-CoV-2 viral copies this assay can detect is 250  copies / mL. A negative result does not preclude SARS-CoV-2 infection  and should not be used as the sole basis for treatment or other  patient management decisions.  A negative result may occur with  improper specimen collection / handling, submission of specimen other  than  nasopharyngeal swab, presence of viral mutation(s) within the  areas targeted by this assay, and inadequate number of viral copies  (<250 copies / mL). A negative result must be combined with clinical  observations, patient history, and epidemiological information. If result is POSITIVE SARS-CoV-2 target nucleic acids are DETECTED.  The SARS-CoV-2 RNA is generally detectable in upper and lower  respiratory specimens during the acute phase of infection.  Positive  results are indicative of active infection with SARS-CoV-2.  Clinical  correlation with patient history and other diagnostic information is  necessary to determine patient infection status.  Positive results do  not rule out bacterial infection or co-infection with other viruses. If result is PRESUMPTIVE POSTIVE SARS-CoV-2 nucleic acids MAY BE PRESENT.   A presumptive positive result was obtained on the submitted specimen  and confirmed on repeat testing.  While 2019 novel coronavirus  (SARS-CoV-2) nucleic acids may be present in the submitted sample  additional confirmatory testing may be necessary for epidemiological  and / or clinical management purposes  to differentiate between  SARS-CoV-2 and other Sarbecovirus currently known to infect humans.  If clinically indicated additional testing with an alternate test  methodology [phone]) i s advised. The SARS-CoV-2 RNA is generally  detectable in upper and lower respiratory specimens during the acute   phase of infection. The expected result is Negative. Fact Sheet for Patients:  StrictlyIdeas.no Fact Sheet for Healthcare Providers: BankingDealers.co.za This test is not yet approved or cleared by the [place] FDA and has been authorized for detection and/or diagnosis of SARS-CoV-2 by FDA under an Emergency Use Authorization (EUA).  This EUA will remain in effect (meaning this test can be used) for the duration of the COVID-19 declaration under Section 564(b)(1) of the Act, 21 U.S.C. section 360bbb-3(b)(1), unless the authorization is terminated or revoked sooner. Performed at [facility], [place] [address], [place], [place] [address]   Blood Culture (routine x 2)     Status: None   Collection Time: 01/05/19  1:00 PM   Specimen: BLOOD  Result Value Ref Range Status   Specimen Description BLOOD SITE NOT SPECIFIED  Final   Special Requests   Final    BOTTLES DRAWN AEROBIC AND ANAEROBIC Blood Culture adequate volume   Culture   Final    NO GROWTH 5 DAYS Performed at [facility], [address], [place], [place] [address]    Report Status 01/10/2019 FINAL  Final  Blood Culture (routine x 2)     Status: None   Collection Time: 01/05/19  1:00 PM   Specimen: BLOOD  Result Value Ref Range Status   Specimen Description BLOOD SITE NOT SPECIFIED  Final   Special Requests   Final    BOTTLES DRAWN AEROBIC ONLY Blood Culture results may not be optimal due to an inadequate volume of blood received in culture bottles   Culture   Final    NO GROWTH 5 DAYS Performed at [facility], [place] [address] [place], [place] [address]    Report Status 01/10/2019 FINAL  Final  Blood Culture (routine x 2)     Status: None (Preliminary result)   Collection Time: 01/12/19 11:36 AM   Specimen: BLOOD  Result Value Ref Range Status   Specimen Description BLOOD RIGHT ANTECUBITAL  Final   Special Requests   Final    BOTTLES DRAWN AEROBIC  AND ANAEROBIC Blood Culture results may not be optimal due to an inadequate volume of blood received in  culture bottles   Culture   Final    NO GROWTH 3 DAYS Performed at [facility], [place] [address], [place],  [address]    Report Status PENDING  Incomplete         Radiology Studies: No results found.      Scheduled Meds: . albuterol  2 puff Inhalation Q6H  . atorvastatin  60 mg Oral Daily  . dexamethasone  6 mg Oral Q24H  . enoxaparin (LOVENOX) injection  75 mg Subcutaneous Q24H  . famotidine  20 mg Oral BID  . insulin aspart  0-9 Units Subcutaneous TID WC  . levothyroxine  100 mcg Oral QAC breakfast  . lisinopril  5 mg Oral Daily  . sodium chloride flush  3 mL Intravenous Q12H  . vitamin C  500 mg Oral Daily  . zinc sulfate  220 mg Oral Daily   Continuous Infusions: . remdesivir 100 mg in NS 250 mL Stopped (01/14/19 1258)     LOS: 3 days   The patient is critically ill with multiple organ systems failure and requires high complexity decision making for assessment and support, frequent evaluation and titration of therapies, application of advanced monitoring technologies and extensive interpretation of multiple databases. Critical Care Time devoted to patient care services described in this note  Time spent: 40 minutes     Laree Garron, Geraldo Docker, MD [facility] Pager [phone]  If 7PM-7AM, please contact night-coverage [email or web address] Password [facility] 01/15/2019, 8:19 AM

## 2019-01-16 LAB — CBC WITH DIFFERENTIAL/PLATELET
Abs Immature Granulocytes: 0.26 10*3/uL — ABNORMAL HIGH (ref 0.00–0.07)
Basophils Absolute: 0 10*3/uL (ref 0.0–0.1)
Basophils Relative: 0 %
Eosinophils Absolute: 0 10*3/uL (ref 0.0–0.5)
Eosinophils Relative: 0 %
HCT: 38.9 % — ABNORMAL LOW (ref 39.0–52.0)
Hemoglobin: 12.6 g/dL — ABNORMAL LOW (ref 13.0–17.0)
Immature Granulocytes: 2 %
Lymphocytes Relative: 11 %
Lymphs Abs: 1.2 10*3/uL (ref 0.7–4.0)
MCH: 30 pg (ref 26.0–34.0)
MCHC: 32.4 g/dL (ref 30.0–36.0)
MCV: 92.6 fL (ref 80.0–100.0)
Monocytes Absolute: 0.6 10*3/uL (ref 0.1–1.0)
Monocytes Relative: 5 %
Neutro Abs: 8.7 10*3/uL — ABNORMAL HIGH (ref 1.7–7.7)
Neutrophils Relative %: 82 %
Platelets: 211 10*3/uL (ref 150–400)
RBC: 4.2 MIL/uL — ABNORMAL LOW (ref 4.22–5.81)
RDW: 15.6 % — ABNORMAL HIGH (ref 11.5–15.5)
WBC: 10.8 10*3/uL — ABNORMAL HIGH (ref 4.0–10.5)
nRBC: 0 % (ref 0.0–0.2)

## 2019-01-16 LAB — PHOSPHORUS: Phosphorus: 4.2 mg/dL (ref 2.5–4.6)

## 2019-01-16 LAB — COMPREHENSIVE METABOLIC PANEL
ALT: 48 U/L — ABNORMAL HIGH (ref 0–44)
AST: 25 U/L (ref 15–41)
Albumin: 2.9 g/dL — ABNORMAL LOW (ref 3.5–5.0)
Alkaline Phosphatase: 46 U/L (ref 38–126)
Anion gap: 8 (ref 5–15)
BUN: 22 mg/dL (ref 8–23)
CO2: 23 mmol/L (ref 22–32)
Calcium: 8.6 mg/dL — ABNORMAL LOW (ref 8.9–10.3)
Chloride: 102 mmol/L (ref 98–111)
Creatinine, Ser: 0.99 mg/dL (ref 0.61–1.24)
GFR calc Af Amer: >60 mL/min (ref >60)
GFR calc non Af Amer: >60 mL/min (ref >60)
Glucose, Bld: 109 mg/dL — ABNORMAL HIGH (ref 70–99)
Potassium: 4.4 mmol/L (ref 3.5–5.1)
Sodium: 133 mmol/L — ABNORMAL LOW (ref 135–145)
Total Bilirubin: 0.6 mg/dL (ref 0.3–1.2)
Total Protein: 6.1 g/dL — ABNORMAL LOW (ref 6.5–8.1)

## 2019-01-16 LAB — GLUCOSE, CAPILLARY
Glucose-Capillary: 81 mg/dL (ref 70–99)
Glucose-Capillary: 82 mg/dL (ref 70–99)
Glucose-Capillary: 105 mg/dL — ABNORMAL HIGH (ref 70–99)
Glucose-Capillary: 151 mg/dL — ABNORMAL HIGH (ref 70–99)

## 2019-01-16 LAB — D-DIMER, QUANTITATIVE: D-Dimer, Quant: 3.76 ug/mL-FEU — ABNORMAL HIGH (ref 0.00–0.50)

## 2019-01-16 LAB — FERRITIN: Ferritin: 692 ng/mL — ABNORMAL HIGH (ref 24–336)

## 2019-01-16 LAB — MAGNESIUM: Magnesium: 2 mg/dL (ref 1.7–2.4)

## 2019-01-16 LAB — C-REACTIVE PROTEIN: CRP: <0.8 mg/dL (ref <1.0)

## 2019-01-16 NOTE — Progress Notes (Signed)
PROGRESS NOTE    Larry Richmond  [id number] DOB: [birth year] DOA: 01/12/2019 PCP: Default, Provider, MD   Brief Narrative:  68 y.o. BM PMHx HTN, diabetes type 2 controlled with complication, HLD, chronic respiratory failure with hypoxia, tobacco use disorder, COPD,OSA (noncompliant), morbid obesity, s/p thyroidectomy for goiter, and GERD, medically noncompliant  who presents with complaints of persistent cough and shortness of breath.  Patient was just hospitalized from 10/23-10/29 with pneumonia secondary to COVID-19.  During his hospitalization he received 5 days of remdesivir, plasma, Actemra, and steroids.  He had been initially started on empiric antibiotics of cefepime as  procalcitonin was initially noted to be elevated, but was discharged home to complete a 10-day course of Levaquin.  At the time of discharge he did not require oxygen.  However, after getting home patient reported having worsening cough being unable to sleep throughout the night.  Denies having any significant fever, headache, nausea, vomiting, or diarrhea symptoms at this time.  He was unable to collect any significant distance due to cough and worsening shortness of breath.   ED Course: Upon admission into the emergency department patient was seen to be afebrile, respirations 26-29, O2 saturations 92 to 97% on 2 L nasal cannula oxygen, and all other vital signs maintained.  Labs were relatively unremarkable except for lactic acid 2.1.  Chest x-ray showed worsening multifocal pneumonia.  Patient was given Tylenol 1000 mg, vancomycin, cefepime, and Reglan.   Subjective: 11/3 afebrile last 24 hours positive S OB but significantly improved, negative if chest pain      Assessment & Plan:   Principal Problem:   Pneumonia due to COVID-19 virus Active Problems:   Tobacco use disorder   Acute on chronic respiratory failure with hypoxia (HCC)   Hypothyroidism   Morbid obesity (HCC)   HLD (hyperlipidemia)   Lactic  acidosis   Diabetes mellitus type 2, controlled ([place])   Benign essential HTN   COPD (chronic obstructive pulmonary disease) (HCC)   OSA on CPAP   Medically noncompliant   Pneumonia due to Covid 19/acute on chronic respiratory failure with hypoxia -Decadron 6 mg daily -Remdesivir completed on 10/27  -Repeat Remdesivir per pharmacy  COVID-19 Labs  Recent Labs    01/14/19 0133 01/15/19 0415 01/16/19 0400  DDIMER 14.76* 8.14* 3.76*  FERRITIN 779* 768* 692*  CRP <0.8 <0.8 <0.8    Lab Results  Component Value Date   SARSCOV2NAA POSITIVE (A) 01/05/2019   Tobacco use disorder -Patient smokes 1 pack every 3 days up until he contracted Covid.  We spoke at length about absolute need to discontinue smoking.. -Declined nicotine patch at this time.  COPD -Understands secondary to COPD + Covid pneumonia may have to go home on O2 for significant amount of time. -Will require spirometry pre-/post bronchodilation with DLCO after he fully recovered  OSA -Noncompliant with CPAP at home per daughter -Counseled patient on the importance of the use of CPAP.  Agreed to see his PCP upon discharge to set up sleep study -See Covid pneumonia  Essential HTN -11/1 increase lisinopril 5 mg daily  Diabetes type 2 controlled with complication -[id number] hemoglobin A1c= 6.0 -Metformin held -Sensitive SSI  HLD -11/1 LDL = 76  -Increase Lipitor 60 mg daily  Hypothyroidism -Synthroid 100 mcg daily  Morbid obesity -Carb modified diet  Hyperkalemia -Kayexalate 30 g x 2 doses     DVT prophylaxis: Covid protocol Lovenox Code Status: Full Family Communication: 11/1 spoke with [name] (daughter) counseled her on plan of  care answered all questions Disposition Plan: TBD   Consultants:    Procedures/Significant Events:  10/30 PCXR:-Multifocal airspace opacity consistent with widespread pneumonia.Increase in opacity in the left lower lung region and new increased opacity in the right upper  lobe.  -Stable opacity right lower lung region. -stable scarring and prominent bulla in the right upper lobe.     I have personally reviewed and interpreted all radiology studies and my findings are as above.  VENTILATOR SETTINGS: Nasal cannula 2 L/min SPO2 95%   Cultures 10/23 SARS coronavirus positive 10/30 blood RIGHT antecubital NGTD  10/31 respiratory virus panel pending 10/31 sputum pending    Antimicrobials: Anti-infectives (From admission, onward)   Start     Stop   01/13/19 1000  remdesivir 100 mg in sodium chloride 0.9 % 250 mL IVPB     01/18/19 0959   01/12/19 1245  vancomycin (VANCOCIN) IVPB 1000 mg/200 mL premix     01/12/19 1453   01/12/19 1245  ceFEPIme (MAXIPIME) 2 g in sodium chloride 0.9 % 100 mL IVPB     01/12/19 1707       Devices    LINES / TUBES:      Continuous Infusions: . remdesivir 100 mg in NS 250 mL 100 mg (01/16/19 1042)     Objective: Vitals:   01/16/19 0412 01/16/19 0715 01/16/19 0900 01/16/19 1640  BP: 121/78 (!) 141/82  131/78  Pulse: 60 (!) 49  (!) 57  Resp: 14 18 19 20   Temp: 97.7 F (36.5 C) 97.6 F (36.4 C)  98.4 F (36.9 C)  TempSrc: Oral Oral  Oral  SpO2: 96% 99%  94%  Weight:      Height:        Intake/Output Summary (Last 24 hours) at 01/16/2019 1722 Last data filed at 01/16/2019 1352 Gross per 24 hour  Intake 930 ml  Output -  Net 930 ml   Filed Weights   01/13/19 0019  Weight: (!) 152.5 kg   Physical Exam:  General: A/O x4, positive acute respiratory distress Eyes: negative scleral hemorrhage, negative anisocoria, negative icterus ENT: Negative Runny nose, negative gingival bleeding, Neck:  Negative scars, masses, torticollis, lymphadenopathy, JVD Lungs: Clear to auscultation bilaterally without wheezes or crackles Cardiovascular: Regular rate and rhythm without murmur gallop or rub normal S1 and S2 Abdomen: negative abdominal pain, nondistended, positive soft, bowel sounds, no rebound, no  ascites, no appreciable mass Extremities: No significant cyanosis, clubbing, or edema bilateral lower extremities Skin: Negative rashes, lesions, ulcers Psychiatric:  Negative depression, negative anxiety, negative fatigue, negative mania  Central nervous system:  Cranial nerves II through XII intact, tongue/uvula midline, all extremities muscle strength 5/5, sensation intact throughout, negative dysarthria, negative expressive aphasia, negative receptive aphasia. .     Data Reviewed: Care during the described time interval was provided by me .  I have reviewed this patient's available data, including medical history, events of note, physical examination, and all test results as part of my evaluation.   CBC: Recent Labs  Lab 01/12/19 1131 01/13/19 0625 01/14/19 0133 01/15/19 0415 01/16/19 0400  WBC 7.4 15.0* 10.4 9.4 10.8*  NEUTROABS 5.6 12.6* 8.8* 7.6 8.7*  HGB 14.9 14.8 13.8 12.9* 12.6*  HCT 44.8 45.2 42.6 40.2 38.9*  MCV 89.8 91.1 91.4 91.6 92.6  PLT 213 215 212 222 [id number]   Basic Metabolic Panel: Recent Labs  Lab 01/12/19 1131 01/13/19 0625 01/14/19 0133 01/15/19 0415 01/16/19 0400  NA 138 133* 134* 131* 133*  K 4.2  4.0 4.9 5.2* 4.4  CL 104 102 104 101 102  CO2 23 22 21* 22 23  GLUCOSE 89 118* 112* 97 109*  BUN 21 20 16 23 22   CREATININE 1.13 0.98 0.93 1.03 0.99  CALCIUM 8.4* 8.4* 8.7* 8.8* 8.6*  MG  --  2.0 2.1 2.0 2.0  PHOS  --  2.7 3.1 3.8 4.2   GFR: Estimated Creatinine Clearance: 108.3 mL/min (by C-G formula based on SCr of 0.99 mg/dL). Liver Function Tests: Recent Labs  Lab 01/12/19 1131 01/13/19 0625 01/14/19 0133 01/15/19 0415 01/16/19 0400  AST 45* 32 28 27 25   ALT 55* 53* 49* 52* 48*  ALKPHOS 52 56 49 49 46  BILITOT 0.5 0.9 0.6 0.7 0.6  PROT 6.4* 7.1 6.7 6.4* 6.1*  ALBUMIN 2.7* 3.1* 3.0* 3.1* 2.9*   No results for input(s): LIPASE, AMYLASE in the last 168 hours. No results for input(s): AMMONIA in the last 168 hours. Coagulation Profile: No  results for input(s): INR, PROTIME in the last 168 hours. Cardiac Enzymes: No results for input(s): CKTOTAL, CKMB, CKMBINDEX, TROPONINI in the last 168 hours. BNP (last 3 results) No results for input(s): PROBNP in the last 8760 hours. HbA1C: No results for input(s): HGBA1C in the last 72 hours. CBG: Recent Labs  Lab 01/15/19 1608 01/15/19 2026 01/16/19 0738 01/16/19 1156 01/16/19 1640  GLUCAP 94 113* 81 82 105*   Lipid Profile: Recent Labs    01/14/19 0133  CHOL 130  HDL 37*  LDLCALC 76  TRIG 83  CHOLHDL 3.5   Thyroid Function Tests: No results for input(s): TSH, T4TOTAL, FREET4, T3FREE, THYROIDAB in the last 72 hours. Anemia Panel: Recent Labs    01/15/19 0415 01/16/19 0400  FERRITIN 768* 692*   Urine analysis:    Component Value Date/Time   COLORURINE AMBER (A) 01/02/2019 1252   APPEARANCEUR HAZY (A) 01/02/2019 1252   LABSPEC 1.025 01/02/2019 1252   PHURINE 5.0 01/02/2019 1252   GLUCOSEU NEGATIVE 01/02/2019 1252   HGBUR NEGATIVE 01/02/2019 1252   [place] 01/02/2019 1252   KETONESUR 5 (A) 01/02/2019 1252   PROTEINUR 30 (A) 01/02/2019 1252   NITRITE NEGATIVE 01/02/2019 1252   LEUKOCYTESUR NEGATIVE 01/02/2019 1252   Sepsis Labs: @LABRCNTIP (procalcitonin:4,lacticidven:4)  ) Recent Results (from the past 240 hour(s))  Blood Culture (routine x 2)     Status: None (Preliminary result)   Collection Time: 01/12/19 11:36 AM   Specimen: BLOOD  Result Value Ref Range Status   Specimen Description BLOOD RIGHT ANTECUBITAL  Final   Special Requests   Final    BOTTLES DRAWN AEROBIC AND ANAEROBIC Blood Culture results may not be optimal due to an inadequate volume of blood received in culture bottles   Culture   Final    NO GROWTH 4 DAYS Performed at [facility], [place] [address], [place], [place] [address]    Report Status PENDING  Incomplete         Radiology Studies: No results found.      Scheduled Meds: . albuterol  2 puff  Inhalation Q6H  . atorvastatin  60 mg Oral Daily  . dexamethasone  6 mg Oral Q24H  . enoxaparin (LOVENOX) injection  75 mg Subcutaneous Q24H  . famotidine  20 mg Oral BID  . insulin aspart  0-9 Units Subcutaneous TID WC  . levothyroxine  100 mcg Oral QAC breakfast  . lisinopril  5 mg Oral Daily  . sodium chloride flush  3 mL Intravenous Q12H  .  vitamin C  500 mg Oral Daily  . zinc sulfate  220 mg Oral Daily   Continuous Infusions: . remdesivir 100 mg in NS 250 mL 100 mg (01/16/19 1042)     LOS: 4 days   The patient is critically ill with multiple organ systems failure and requires high complexity decision making for assessment and support, frequent evaluation and titration of therapies, application of advanced monitoring technologies and extensive interpretation of multiple databases. Critical Care Time devoted to patient care services described in this note  Time spent: 40 minutes     Dj Senteno, Geraldo Docker, MD [facility] Pager [phone]  If 7PM-7AM, please contact night-coverage [email or web address] Password [email or web address] 01/16/2019, 5:22 PM

## 2019-01-17 ENCOUNTER — Inpatient Hospital Stay (HOSPITAL_COMMUNITY): Payer: Medicare Other

## 2019-01-17 LAB — GLUCOSE, CAPILLARY
Glucose-Capillary: 91 mg/dL (ref 70–99)
Glucose-Capillary: 120 mg/dL — ABNORMAL HIGH (ref 70–99)
Glucose-Capillary: 110 mg/dL — ABNORMAL HIGH (ref 70–99)
Glucose-Capillary: 129 mg/dL — ABNORMAL HIGH (ref 70–99)

## 2019-01-17 LAB — COMPREHENSIVE METABOLIC PANEL
ALT: 48 U/L — ABNORMAL HIGH (ref 0–44)
AST: 23 U/L (ref 15–41)
Albumin: 3.1 g/dL — ABNORMAL LOW (ref 3.5–5.0)
Alkaline Phosphatase: 41 U/L (ref 38–126)
Anion gap: 8 (ref 5–15)
BUN: 26 mg/dL — ABNORMAL HIGH (ref 8–23)
CO2: 23 mmol/L (ref 22–32)
Calcium: 8.5 mg/dL — ABNORMAL LOW (ref 8.9–10.3)
Chloride: 102 mmol/L (ref 98–111)
Creatinine, Ser: 0.99 mg/dL (ref 0.61–1.24)
GFR calc Af Amer: >60 mL/min (ref >60)
GFR calc non Af Amer: >60 mL/min (ref >60)
Glucose, Bld: 104 mg/dL — ABNORMAL HIGH (ref 70–99)
Potassium: 4.2 mmol/L (ref 3.5–5.1)
Sodium: 133 mmol/L — ABNORMAL LOW (ref 135–145)
Total Bilirubin: 0.8 mg/dL (ref 0.3–1.2)
Total Protein: 6.3 g/dL — ABNORMAL LOW (ref 6.5–8.1)

## 2019-01-17 LAB — CBC WITH DIFFERENTIAL/PLATELET
Abs Immature Granulocytes: 0.23 10*3/uL — ABNORMAL HIGH (ref 0.00–0.07)
Basophils Absolute: 0 10*3/uL (ref 0.0–0.1)
Basophils Relative: 0 %
Eosinophils Absolute: 0 10*3/uL (ref 0.0–0.5)
Eosinophils Relative: 0 %
HCT: 39.7 % (ref 39.0–52.0)
Hemoglobin: 12.8 g/dL — ABNORMAL LOW (ref 13.0–17.0)
Immature Granulocytes: 2 %
Lymphocytes Relative: 13 %
Lymphs Abs: 1.4 10*3/uL (ref 0.7–4.0)
MCH: 29.4 pg (ref 26.0–34.0)
MCHC: 32.2 g/dL (ref 30.0–36.0)
MCV: 91.3 fL (ref 80.0–100.0)
Monocytes Absolute: 0.6 10*3/uL (ref 0.1–1.0)
Monocytes Relative: 6 %
Neutro Abs: 8.1 10*3/uL — ABNORMAL HIGH (ref 1.7–7.7)
Neutrophils Relative %: 79 %
Platelets: 207 10*3/uL (ref 150–400)
RBC: 4.35 MIL/uL (ref 4.22–5.81)
RDW: 15.6 % — ABNORMAL HIGH (ref 11.5–15.5)
WBC: 10.3 10*3/uL (ref 4.0–10.5)
nRBC: 0 % (ref 0.0–0.2)

## 2019-01-17 LAB — PHOSPHORUS: Phosphorus: 4.5 mg/dL (ref 2.5–4.6)

## 2019-01-17 LAB — D-DIMER, QUANTITATIVE
D-Dimer, Quant: 3.16 ug/mL-FEU — ABNORMAL HIGH (ref 0.00–0.50)
D-Dimer, Quant: 3.09 ug/mL-FEU — ABNORMAL HIGH (ref 0.00–0.50)

## 2019-01-17 LAB — CULTURE, BLOOD (ROUTINE X 2): Culture: NO GROWTH

## 2019-01-17 LAB — C-REACTIVE PROTEIN: CRP: <0.8 mg/dL (ref <1.0)

## 2019-01-17 LAB — APTT: aPTT: 26 seconds (ref 24–36)

## 2019-01-17 LAB — PROTIME-INR
INR: 1.1 (ref 0.8–1.2)
Prothrombin Time: 14.4 seconds (ref 11.4–15.2)

## 2019-01-17 LAB — MAGNESIUM: Magnesium: 2 mg/dL (ref 1.7–2.4)

## 2019-01-17 LAB — FERRITIN: Ferritin: 777 ng/mL — ABNORMAL HIGH (ref 24–336)

## 2019-01-17 MED ORDER — HEPARIN (PORCINE) 25000 UT/250ML-% IV SOLN
1700.0000 [IU]/h | INTRAVENOUS | Status: DC
  Administered 2019-01-17: 1900 [IU]/h via INTRAVENOUS
  Administered 2019-01-18: 04:00:00 1700 [IU]/h via INTRAVENOUS
  Filled 2019-01-17 (×2): qty 250

## 2019-01-17 MED ORDER — LORAZEPAM 0.5 MG PO TABS
0.5000 mg | ORAL_TABLET | ORAL | Status: AC | PRN
  Administered 2019-01-17 – 2019-01-18 (×2): 0.5 mg via ORAL
  Filled 2019-01-17 (×2): qty 1

## 2019-01-17 MED ORDER — IOHEXOL 350 MG/ML SOLN
100.0000 mL | Freq: Once | INTRAVENOUS | Status: AC | PRN
  Administered 2019-01-17: 100 mL via INTRAVENOUS

## 2019-01-17 MED ORDER — HEPARIN BOLUS VIA INFUSION
6000.0000 [IU] | Freq: Once | INTRAVENOUS | Status: AC
  Administered 2019-01-17: 6000 [IU] via INTRAVENOUS
  Filled 2019-01-17: qty 6000

## 2019-01-17 NOTE — Progress Notes (Signed)
[place] for Heparin Indication: pulmonary embolus  No Known Allergies  Patient Measurements: Height: 6\' 2"  (188 cm) Weight: (!) 336 lb 3.2 oz (152.5 kg) IBW/kg (Calculated) : 82.2 Heparin Dosing Weight: 118 kg  Vital Signs: Temp: 98.3 F (36.8 C) (11/04 0736) Temp Source: Oral (11/04 0736) BP: 109/77 (11/04 0736) Pulse Rate: 65 (11/04 0736)  Labs: Recent Labs    01/15/19 0415 01/16/19 0400 01/17/19 0425 01/17/19 0935  HGB 12.9* 12.6* 12.8*  --   HCT 40.2 38.9* 39.7  --   PLT 222 211 207  --   APTT  --   --   --  26  LABPROT  --   --   --  14.4  INR  --   --   --  1.1  CREATININE 1.03 0.99 0.99  --     Estimated Creatinine Clearance: 108.3 mL/min (by C-G formula based on SCr of 0.99 mg/dL).   Medical History: Past Medical History:  Diagnosis Date  . Benign essential HTN 01/13/2019  . Diabetes mellitus without complication ([place])   . GERD (gastroesophageal reflux disease)   . Hyperlipidemia   . Thyroid disease     Assessment: 68 y/o M admitted to [facility] w/ COVID-19 PNA previously on Lovenox 0.5 mg/kg/day for DVT prophylaxis with last dose 11/3 ordered IV UFH for new PE.   Goal of Therapy:  Heparin level 0.3-0.7 units/ml Monitor platelets by anticoagulation protocol: Yes   Plan:  Give 6000 units bolus x 1 Start heparin infusion at 1900 units/hr Check anti-Xa level in 6 hours and daily while on heparin Continue to monitor H&H and platelets  Geroge, Crisman D 01/17/2019,2:40 PM

## 2019-01-17 NOTE — Plan of Care (Addendum)
Patient up to chair majority of day. No s/s of pain or distress. All medication given well tolerated.Patient started on Heparin drip for pulmonary embolus currently infusing at 1900 units/hr. Spoke to patient daughter [name] with care plan update. Will continue to monitor for remainder of shift.    Problem: Education: Goal: Knowledge of risk factors and measures for prevention of condition will improve Outcome: Progressing   Problem: Coping: Goal: Psychosocial and spiritual needs will be supported Outcome: Progressing   Problem: Respiratory: Goal: Will maintain a patent airway Outcome: Progressing Goal: Complications related to the disease process, condition or treatment will be avoided or minimized Outcome: Progressing   Problem: Education: Goal: Knowledge of General Education information will improve Description: Including pain rating scale, medication(s)/side effects and non-pharmacologic comfort measures Outcome: Progressing   Problem: Health Behavior/Discharge Planning: Goal: Ability to manage health-related needs will improve Outcome: Progressing   Problem: Clinical Measurements: Goal: Ability to maintain clinical measurements within normal limits will improve Outcome: Progressing Goal: Will remain free from infection Outcome: Progressing Goal: Diagnostic test results will improve Outcome: Progressing Goal: Respiratory complications will improve Outcome: Progressing Goal: Cardiovascular complication will be avoided Outcome: Progressing   Problem: Activity: Goal: Risk for activity intolerance will decrease Outcome: Progressing   Problem: Nutrition: Goal: Adequate nutrition will be maintained Outcome: Progressing   Problem: Coping: Goal: Level of anxiety will decrease Outcome: Progressing   Problem: Elimination: Goal: Will not experience complications related to bowel motility Outcome: Progressing Goal: Will not experience complications related to urinary  retention Outcome: Progressing   Problem: Pain Managment: Goal: General experience of comfort will improve Outcome: Progressing   Problem: Safety: Goal: Ability to remain free from injury will improve Outcome: Progressing   Problem: Skin Integrity: Goal: Risk for impaired skin integrity will decrease Outcome: Progressing

## 2019-01-17 NOTE — Plan of Care (Signed)
  Problem: Education: Goal: Knowledge of risk factors and measures for prevention of condition will improve Outcome: Progressing   Problem: Coping: Goal: Psychosocial and spiritual needs will be supported Outcome: Progressing   Problem: Respiratory: Goal: Will maintain a patent airway Outcome: Progressing Goal: Complications related to the disease process, condition or treatment will be avoided or minimized Outcome: Progressing   Problem: Education: Goal: Knowledge of General Education information will improve Description: Including pain rating scale, medication(s)/side effects and non-pharmacologic comfort measures Outcome: Progressing   Problem: Health Behavior/Discharge Planning: Goal: Ability to manage health-related needs will improve Outcome: Progressing   Problem: Clinical Measurements: Goal: Ability to maintain clinical measurements within normal limits will improve Outcome: Progressing Goal: Will remain free from infection Outcome: Progressing Goal: Diagnostic test results will improve Outcome: Progressing Goal: Respiratory complications will improve Outcome: Progressing Goal: Cardiovascular complication will be avoided Outcome: Progressing   Problem: Activity: Goal: Risk for activity intolerance will decrease Outcome: Progressing   Problem: Nutrition: Goal: Adequate nutrition will be maintained Outcome: Progressing   Problem: Coping: Goal: Level of anxiety will decrease Outcome: Progressing   Problem: Elimination: Goal: Will not experience complications related to bowel motility Outcome: Progressing Goal: Will not experience complications related to urinary retention Outcome: Progressing   Problem: Pain Managment: Goal: General experience of comfort will improve Outcome: Progressing   Problem: Safety: Goal: Ability to remain free from injury will improve Outcome: Progressing

## 2019-01-17 NOTE — Progress Notes (Signed)
[facility] PROGRESS NOTE    Progress Note  KENON PAIK  [id number] DOB: [birth year] DOA: 01/12/2019 PCP: Default, Provider, MD     Brief Narrative:   MANN SOLOW is an 68 y.o. male past medical history of essential hypertension, diabetes mellitus type 2 controlled, hyperlipidemia, chronic respiratory failure with hypoxia tobacco use disorder, COPD and obstructive sleep apnea, morbid obesity status post thyroidectomy and noncompliant presents with persistent cough and shortness of breath was hospitalized from 01/05/2019 to 01/11/2019 for COVID-19 viral pneumonia.  During this hospitalization he received 5 days remdesivir plasmic tremor and steroids.  He also completed 10-day course of antibiotics as an outpatient.  At the time of discharge he did not require antibiotics however after going home he reports a worsening cough unable to sleep at night fever headache nausea vomiting and diarrhea so he decided to come back to the ED.  The ED saturations were 92% respiration was 26-29 on 2 L of nasal cannula and lactic acid of 2.1 chest x-ray showed worsening multifocal pneumonia.  Assessment/Plan:   Acute respiratory failure with hypoxia of unclear etiology: He completed a course of IV remdesivir and steroids on 01/11/2019 admission. Satting greater than 93% on room air.  On admission he was satting greater 92% on 2 L of oxygen he was tachycardic. Inflammatory markers were significantly elevated, his D-dimer on admission was greater than 20 his CRP was 0.8 (which was going down).   CT angio of the chest was done which was positive for PE. Lower ext doppler to rule out DVT is pending. We will go ahead and start on IV heparin.  Tobacco use disorder: Continues to smoke 1 pack/day.  He declined a nicotine patch.  COPD: Continue incentive spirometry. He is currently stable he has no wheezing on physical exam.  Next  Obstructive sleep apnea: Noncompliant.  Essential hypertension:  Continue lisinopril.  Diabetes mellitus type 2 with complications: Q000111Q his hemoglobin A1c was 6.0. Metformin held on admission continue sliding scale insulin.  HDL: LDL 76 continue Lipitor.  Hyperthyroidism: Continue Synthroid.  Morbid obesity ([place]) Counseling    DVT prophylaxis: heparin Family Communication:none Disposition Plan/Barrier to D/C: home in 3 days Code Status:     Code Status Orders  (From admission, onward)         Start     Ordered   01/12/19 1529  Full code  Continuous     01/12/19 1532        Code Status History    Date Active Date Inactive Code Status Order ID Comments User Context   01/05/2019 1727 01/11/2019 1709 Full Code [id number]  Sid Falcon, MD ED   08/11/2015 0003 08/12/2015 1416 Full Code [id number]  Theressa Millard, MD Inpatient   02/18/2012 1804 02/24/2012 1508 Full Code [id number]  Ezequiel Essex, MD ED   Advance Care Planning Activity        IV Access:    Peripheral IV   Procedures and diagnostic studies:   No results found.   Medical Consultants:    None.  Anti-Infectives:   None  Subjective:    [name] he relates his breathing is better compared to yesterday.  Objective:    Vitals:   01/16/19 1920 01/16/19 2008 01/17/19 0431 01/17/19 0736  BP:  124/70 131/78 109/77  Pulse: 70 70 62 65  Resp: (!) 22 20 20 16   Temp:  98.2 F (36.8 C) (!) 97.3 F (36.3 C) 98.3 F (36.8 C)  TempSrc:  Oral Oral Oral  SpO2: 92% 92% 96% 93%  Weight:      Height:       SpO2: 93 % O2 Flow Rate (L/min): 2 L/min FiO2 (%): 94 %   Intake/Output Summary (Last 24 hours) at 01/17/2019 0832 Last data filed at 01/16/2019 1823 Gross per 24 hour  Intake 1090 ml  Output -  Net 1090 ml   Filed Weights   01/13/19 0019  Weight: (!) 152.5 kg    Exam: General exam: In no acute distress. Respiratory system: Good air movement and clear to auscultation. Cardiovascular system: S1 & S2 heard, RRR.   Gastrointestinal system: Abdomen is nondistended, soft and nontender.  Central nervous system: Alert and oriented. No focal neurological deficits. Extremities: No pedal edema. Skin: No rashes, lesions or ulcers Psychiatry: Judgement and insight appear normal. Mood & affect appropriate.    Data Reviewed:    Labs: Basic Metabolic Panel: Recent Labs  Lab 01/13/19 0625 01/14/19 0133 01/15/19 0415 01/16/19 0400 01/17/19 0425  NA 133* 134* 131* 133* 133*  K 4.0 4.9 5.2* 4.4 4.2  CL 102 104 101 102 102  CO2 22 21* 22 23 23   GLUCOSE 118* 112* 97 109* 104*  BUN 20 16 23 22  26*  CREATININE 0.98 0.93 1.03 0.99 0.99  CALCIUM 8.4* 8.7* 8.8* 8.6* 8.5*  MG 2.0 2.1 2.0 2.0 2.0  PHOS 2.7 3.1 3.8 4.2 4.5   GFR Estimated Creatinine Clearance: 108.3 mL/min (by C-G formula based on SCr of 0.99 mg/dL). Liver Function Tests: Recent Labs  Lab 01/13/19 0625 01/14/19 0133 01/15/19 0415 01/16/19 0400 01/17/19 0425  AST 32 28 27 25 23   ALT 53* 49* 52* 48* 48*  ALKPHOS 56 49 49 46 41  BILITOT 0.9 0.6 0.7 0.6 0.8  PROT 7.1 6.7 6.4* 6.1* 6.3*  ALBUMIN 3.1* 3.0* 3.1* 2.9* 3.1*   No results for input(s): LIPASE, AMYLASE in the last 168 hours. No results for input(s): AMMONIA in the last 168 hours. Coagulation profile No results for input(s): INR, PROTIME in the last 168 hours. COVID-19 Labs  Recent Labs    01/15/19 0415 01/16/19 0400 01/17/19 0415 01/17/19 0425  DDIMER 8.14* 3.76*  --  3.16*  FERRITIN 768* 692* 777*  --   CRP <0.8 <0.8 <0.8  --     Lab Results  Component Value Date   SARSCOV2NAA POSITIVE (A) 01/05/2019    CBC: Recent Labs  Lab 01/13/19 0625 01/14/19 0133 01/15/19 0415 01/16/19 0400 01/17/19 0425  WBC 15.0* 10.4 9.4 10.8* 10.3  NEUTROABS 12.6* 8.8* 7.6 8.7* 8.1*  HGB 14.8 13.8 12.9* 12.6* 12.8*  HCT 45.2 42.6 40.2 38.9* 39.7  MCV 91.1 91.4 91.6 92.6 91.3  PLT 215 212 222 211 207   Cardiac Enzymes: No results for input(s): CKTOTAL, CKMB, CKMBINDEX,  TROPONINI in the last 168 hours. BNP (last 3 results) No results for input(s): PROBNP in the last 8760 hours. CBG: Recent Labs  Lab 01/16/19 0738 01/16/19 1156 01/16/19 1640 01/16/19 2051 01/17/19 0737  GLUCAP 81 82 105* 151* 91   D-Dimer: Recent Labs    01/16/19 0400 01/17/19 0425  DDIMER 3.76* 3.16*   Hgb A1c: No results for input(s): HGBA1C in the last 72 hours. Lipid Profile: No results for input(s): CHOL, HDL, LDLCALC, TRIG, CHOLHDL, LDLDIRECT in the last 72 hours. Thyroid function studies: No results for input(s): TSH, T4TOTAL, T3FREE, THYROIDAB in the last 72 hours.  Invalid input(s): FREET3 Anemia work up: [employer]    01/16/19  0400 01/17/19 0415  FERRITIN 692* 777*   Sepsis Labs: Recent Labs  Lab 01/12/19 1131 01/12/19 1253  01/13/19 0659 01/14/19 0133 01/15/19 0415 01/16/19 0400 01/17/19 0425  PROCALCITON <0.10  --   --  <0.10 <0.10 <0.10  --   --   WBC 7.4  --    < >  --  10.4 9.4 10.8* 10.3  LATICACIDVEN  --  2.1*  --   --   --   --   --   --    < > = values in this interval not displayed.   Microbiology Recent Results (from the past 240 hour(s))  Blood Culture (routine x 2)     Status: None (Preliminary result)   Collection Time: 01/12/19 11:36 AM   Specimen: BLOOD  Result Value Ref Range Status   Specimen Description BLOOD RIGHT ANTECUBITAL  Final   Special Requests   Final    BOTTLES DRAWN AEROBIC AND ANAEROBIC Blood Culture results may not be optimal due to an inadequate volume of blood received in culture bottles   Culture   Final    NO GROWTH 4 DAYS Performed at [facility], [place] [address], [place], [place] [address]    Report Status PENDING  Incomplete     Medications:   . albuterol  2 puff Inhalation Q6H  . atorvastatin  60 mg Oral Daily  . dexamethasone  6 mg Oral Q24H  . enoxaparin (LOVENOX) injection  75 mg Subcutaneous Q24H  . famotidine  20 mg Oral BID  . insulin aspart  0-9 Units Subcutaneous TID WC  .  levothyroxine  100 mcg Oral QAC breakfast  . lisinopril  5 mg Oral Daily  . sodium chloride flush  3 mL Intravenous Q12H  . vitamin C  500 mg Oral Daily  . zinc sulfate  220 mg Oral Daily   Continuous Infusions: . remdesivir 100 mg in NS 250 mL 100 mg (01/16/19 1042)      LOS: 5 days   Charlynne Cousins  [facility]  01/17/2019, 8:32 AM

## 2019-01-18 ENCOUNTER — Inpatient Hospital Stay (HOSPITAL_COMMUNITY): Payer: Medicare Other

## 2019-01-18 DIAGNOSIS — I2694 Multiple subsegmental pulmonary emboli without acute cor pulmonale: Secondary | ICD-10-CM

## 2019-01-18 DIAGNOSIS — M7989 Other specified soft tissue disorders: Secondary | ICD-10-CM

## 2019-01-18 DIAGNOSIS — I2699 Other pulmonary embolism without acute cor pulmonale: Secondary | ICD-10-CM

## 2019-01-18 LAB — CBC
HCT: 44.2 % (ref 39.0–52.0)
Hemoglobin: 14.4 g/dL (ref 13.0–17.0)
MCH: 29.9 pg (ref 26.0–34.0)
MCHC: 32.6 g/dL (ref 30.0–36.0)
MCV: 91.7 fL (ref 80.0–100.0)
Platelets: 237 10*3/uL (ref 150–400)
RBC: 4.82 MIL/uL (ref 4.22–5.81)
RDW: 15.9 % — ABNORMAL HIGH (ref 11.5–15.5)
WBC: 15.9 10*3/uL — ABNORMAL HIGH (ref 4.0–10.5)
nRBC: 0 % (ref 0.0–0.2)

## 2019-01-18 LAB — GLUCOSE, CAPILLARY
Glucose-Capillary: 107 mg/dL — ABNORMAL HIGH (ref 70–99)
Glucose-Capillary: 115 mg/dL — ABNORMAL HIGH (ref 70–99)

## 2019-01-18 LAB — HEPARIN LEVEL (UNFRACTIONATED)
Heparin Unfractionated: 0.83 IU/mL — ABNORMAL HIGH (ref 0.30–0.70)
Heparin Unfractionated: 0.98 IU/mL — ABNORMAL HIGH (ref 0.30–0.70)
Heparin Unfractionated: 0.31 IU/mL (ref 0.30–0.70)

## 2019-01-18 LAB — D-DIMER, QUANTITATIVE: D-Dimer, Quant: 2.64 ug/mL-FEU — ABNORMAL HIGH (ref 0.00–0.50)

## 2019-01-18 MED ORDER — HEPARIN (PORCINE) 25000 UT/250ML-% IV SOLN
1450.0000 [IU]/h | INTRAVENOUS | Status: AC
  Administered 2019-01-18 (×2): 1450 [IU]/h via INTRAVENOUS
  Filled 2019-01-18: qty 250

## 2019-01-18 NOTE — Progress Notes (Signed)
ANTICOAGULATION CONSULT NOTE - Follow Up Consult  Pharmacy Consult for heparin Indication: pulmonary embolus  No Known Allergies  Patient Measurements: Height: 6\' 2"  (188 cm) Weight: (!) 336 lb 3.2 oz (152.5 kg) IBW/kg (Calculated) : 82.2 Heparin Dosing Weight: 118 kg  Vital Signs: Temp: 98.1 F (36.7 C) (11/05 2011) Temp Source: Oral (11/05 2011) BP: 110/66 (11/05 2011)  Labs: Recent Labs    01/16/19 0400 01/17/19 0425 01/17/19 0935 01/17/19 2250 01/18/19 0903 01/18/19 2205  HGB 12.6* 12.8*  --   --  14.4  --   HCT 38.9* 39.7  --   --  44.2  --   PLT 211 207  --   --  237  --   APTT  --   --  26  --   --   --   LABPROT  --   --  14.4  --   --   --   INR  --   --  1.1  --   --   --   HEPARINUNFRC  --   --   --  0.83* 0.98* 0.31  CREATININE 0.99 0.99  --   --   --   --     Estimated Creatinine Clearance: 108.3 mL/min (by C-G formula based on SCr of 0.99 mg/dL).   Medications:  Infusions:  . heparin 1,450 Units/hr (01/18/19 2127)    Assessment: 68 y/o M admitted to [place] w/ COVID-19 PNA previously on Lovenox 0.5 mg/kg/day for DVT prophylaxis with last dose 11/3 ordered IV UFH for new PE.   01/19/2019 0000 UPDATE: Heparin level now 0.31 IU/mL --> now in therapeutic goal range   Goal of Therapy:  Heparin level 0.3-0.7 units/ml Monitor platelets by anticoagulation protocol: Yes   Plan:   Continue heparin IV infusion at 1450 units/hr for HL of 0.31 IU/mL Confirmatory Heparin level in 8 hours after starting Daily heparin level and CBC  Despina Pole, Pharm. D. Clinical Pharmacist 01/19/2019 12:00 AM

## 2019-01-18 NOTE — Progress Notes (Signed)
ANTICOAGULATION CONSULT NOTE - Follow Up Consult  Pharmacy Consult for heparin Indication: pulmonary embolus  No Known Allergies  Patient Measurements: Height: 6\' 2"  (188 cm) Weight: (!) 336 lb 3.2 oz (152.5 kg) IBW/kg (Calculated) : 82.2 Heparin Dosing Weight: 118 kg  Vital Signs: Temp: 98.6 F (37 C) (11/05 0736) Temp Source: Oral (11/05 0736) BP: 149/78 (11/05 0736) Pulse Rate: 79 (11/05 0736)  Labs: Recent Labs    01/16/19 0400 01/17/19 0425 01/17/19 0935 01/17/19 2250  HGB 12.6* 12.8*  --   --   HCT 38.9* 39.7  --   --   PLT 211 207  --   --   APTT  --   --  26  --   LABPROT  --   --  14.4  --   INR  --   --  1.1  --   HEPARINUNFRC  --   --   --  0.83*  CREATININE 0.99 0.99  --   --     Estimated Creatinine Clearance: 108.3 mL/min (by C-G formula based on SCr of 0.99 mg/dL).   Medications:  Infusions:  . heparin 1,700 Units/hr (01/18/19 0336)    Assessment: 68 y/o M admitted to [place] w/ COVID-19 PNA previously on Lovenox 0.5 mg/kg/day for DVT prophylaxis with last dose 11/3 ordered IV UFH for new PE.   Heparin level 0.98 (heparin infusing through IV on right arm, heparin level drawn from left side per RN.)  Supratherapeutic and increased despite rate decrease from 1900 to 1700 units/hr CBC wnl No bleeding or complications reported.   Goal of Therapy:  Heparin level 0.3-0.7 units/ml Monitor platelets by anticoagulation protocol: Yes   Plan:  HOLD heparin drip x1 hour Decrease to heparin IV infusion at 1450 units/hr Heparin level 8 hours after starting Daily heparin level and CBC   Gretta Arab PharmD, BCPS Clinical pharmacist phone 7am- 5pm: [phone] 01/18/2019 9:22 AM

## 2019-01-18 NOTE — Progress Notes (Signed)
ANTICOAGULATION CONSULT NOTE  Pharmacy Consult for Heparin Indication: pulmonary embolus  No Known Allergies  Patient Measurements: Height: 6\' 2"  (188 cm) Weight: (!) 336 lb 3.2 oz (152.5 kg) IBW/kg (Calculated) : 82.2 Heparin Dosing Weight: 118 kg  Vital Signs: Temp: 98.5 F (36.9 C) (11/04 1935) Temp Source: Oral (11/04 1935) BP: 151/58 (11/04 1935) Pulse Rate: 81 (11/04 1935)  Labs: Recent Labs    01/15/19 0415 01/16/19 0400 01/17/19 0425 01/17/19 0935 01/17/19 2250  HGB 12.9* 12.6* 12.8*  --   --   HCT 40.2 38.9* 39.7  --   --   PLT 222 211 207  --   --   APTT  --   --   --  26  --   LABPROT  --   --   --  14.4  --   INR  --   --   --  1.1  --   HEPARINUNFRC  --   --   --   --  0.83*  CREATININE 1.03 0.99 0.99  --   --     Estimated Creatinine Clearance: 108.3 mL/min (by C-G formula based on SCr of 0.99 mg/dL).   Medical History: Past Medical History:  Diagnosis Date  . Benign essential HTN 01/13/2019  . Diabetes mellitus without complication ([place])   . GERD (gastroesophageal reflux disease)   . Hyperlipidemia   . Thyroid disease     Assessment: 68 y/o M admitted to [place] w/ COVID-19 PNA previously on Lovenox 0.5 mg/kg/day for DVT prophylaxis with last dose 11/3 ordered IV UFH for new PE.   Goal of Therapy:  Heparin level 0.3-0.7 units/ml Monitor platelets by anticoagulation protocol: Yes   Plan:   Decrease heparin infusion to  1700 units/hr for HL of 0.83 IU/mL Re-check anti-Xa level in ~8 hours and daily while on heparin Continue to monitor H&H and platelets  Despina Pole, Pharm. D. Clinical Pharmacist 01/18/2019 1:28 AM

## 2019-01-18 NOTE — Progress Notes (Signed)
[facility] PROGRESS NOTE    Progress Note  Larry Richmond  [id number] DOB: 1950-12-13 DOA: 01/12/2019 PCP: Default, Provider, MD     Brief Narrative:   Larry Richmond is an 68 y.o. male past medical history of essential hypertension, diabetes mellitus type 2 controlled, hyperlipidemia, chronic respiratory failure with hypoxia tobacco use disorder, COPD and obstructive sleep apnea, morbid obesity status post thyroidectomy and noncompliant presents with persistent cough and shortness of breath was hospitalized from 01/05/2019 to 01/11/2019 for COVID-19 viral pneumonia.  During this hospitalization he received 5 days remdesivir plasmic tremor and steroids.  He also completed 10-day course of antibiotics as an outpatient.  At the time of discharge he did not require antibiotics however after going home he reports a worsening cough unable to sleep at night fever headache nausea vomiting and diarrhea so he decided to come back to the ED.  The ED saturations were 92% respiration was 26-29 on 2 L of nasal cannula and lactic acid of 2.1 chest x-ray showed worsening multifocal pneumonia.  Assessment/Plan:   Acute respiratory failure with hypoxia due to acute pulmonary embolism: On admission he was tachycardic and hypoxic with inflammatory markers especially his D-dimer was significantly elevated, it was greater than 20 which was up from when he was discharged in the hospital which was was 0.8. CTA  of the chest done on 01/17/2019 showed segmental pulmonary PEs in the right lower lobe. Lower extremity Dopplers pending. He was started on IV heparin. I spoken to him about anticoagulation he would like to be on Eliquis.  Tobacco use disorder: Continues to smoke 1 pack/day.  He declined a nicotine patch.  COPD: Continue incentive spirometry. He is currently stable he has no wheezing on physical exam.   Obstructive sleep apnea: Noncompliant.  Essential hypertension: Continue lisinopril.   Diabetes mellitus type 2 with complications: Q000111Q his hemoglobin A1c was 6.0. Metformin held on admission continue sliding scale insulin.  HDL: LDL 76 continue Lipitor.  Hyperthyroidism: Continue Synthroid.  Morbid obesity ([place]): Counseling    DVT prophylaxis: heparin Family Communication:none Disposition Plan/Barrier to D/C: home in am Code Status:     Code Status Orders  (From admission, onward)         Start     Ordered   01/12/19 1529  Full code  Continuous     01/12/19 1532        Code Status History    Date Active Date Inactive Code Status Order ID Comments User Context   01/05/2019 1727 01/11/2019 1709 Full Code [id number]  Sid Falcon, MD ED   08/11/2015 0003 08/12/2015 1416 Full Code [id number]  Theressa Millard, MD Inpatient   02/18/2012 1804 02/24/2012 1508 Full Code [id number]  Ezequiel Essex, MD ED   Advance Care Planning Activity        IV Access:    Peripheral IV   Procedures and diagnostic studies:   Ct Angio Chest Pe W Or Wo Contrast  Addendum Date: 01/17/2019   ADDENDUM REPORT: 01/17/2019 11:49 ADDENDUM: Critical Value/emergent results were called by telephone at the time of interpretation on 01/17/2019 at 11:34 a.m. to [place] , who verbally acknowledged these results. Electronically Signed   By: Marin Olp M.D.   On: 01/17/2019 11:49   Result Date: 01/17/2019 CLINICAL DATA:  Shortness of breath. Suspect heart failure. Elevated D-dimer. EXAM: CT ANGIOGRAPHY CHEST WITH CONTRAST TECHNIQUE: Multidetector CT imaging of the chest was performed using the standard protocol during bolus administration of  intravenous contrast. Multiplanar CT image reconstructions and MIPs were obtained to evaluate the vascular anatomy. CONTRAST:  180mL OMNIPAQUE IOHEXOL 350 MG/ML SOLN COMPARISON:  02/17/2016 FINDINGS: Cardiovascular: Thoracic aorta is normal caliber without aneurysm or dissection. There is mild calcified plaque over the  thoracic aorta. Calcified plaque over the left main, left anterior descending and right coronary arteries. Pulmonary arterial system is well opacified. Suggestion of a couple tiny peripheral subsegmental emboli over the right lower lobar pulmonary arteries. Mediastinum/Nodes: No mediastinal or hilar adenopathy. Remaining mediastinal structures are unremarkable. Previous thyroidectomy. Lungs/Pleura: Emphysematous bleb over the right apex unchanged. Lungs are adequately inflated demonstrate patchy bilateral airspace process with peripheral and peribronchovascular predilection. Findings likely due to infection in this COVID-19 positive patient. No effusion. Airways are otherwise unremarkable. Upper Abdomen: Stable 2 cm left adrenal nodule likely adenoma. No acute findings. Musculoskeletal: No acute findings. Review of the MIP images confirms the above findings. IMPRESSION: 1. Patchy bilateral airspace process likely multifocal pneumonia in this COVID-19 positive patient. 2. Subtle findings likely representing small subsegmental pulmonary emboli over right lower lobe pulmonary arteries. 3. Aortic Atherosclerosis (ICD10-I70.0) and Emphysema (ICD10-J43.9). Atherosclerotic coronary artery disease. 4.  Stable 2 cm left adrenal nodule likely an adenoma. Electronically Signed: By: Marin Olp M.D. On: 01/17/2019 11:21     Medical Consultants:    None.  Anti-Infectives:   None  Subjective:    Larry Richmond no new complaints in a good mood this morning and happy to be in the hospital.  Objective:    Vitals:   01/17/19 1623 01/17/19 1935 01/18/19 0558 01/18/19 0736  BP: 137/69 (!) 151/58 118/63 (!) 149/78  Pulse: 67 81  79  Resp: 20 19  18   Temp: 98.3 F (36.8 C) 98.5 F (36.9 C) 98.7 F (37.1 C) 98.6 F (37 C)  TempSrc: Oral Oral Oral Oral  SpO2: 93% 91%  94%  Weight:      Height:       SpO2: 94 % O2 Flow Rate (L/min): 2 L/min FiO2 (%): 94 %   Intake/Output Summary (Last 24 hours) at  01/18/2019 0821 Last data filed at 01/18/2019 0600 Gross per 24 hour  Intake 1304.3 ml  Output 2100 ml  Net -795.7 ml   Filed Weights   01/13/19 0019  Weight: (!) 152.5 kg    Exam: General exam: In no acute distress. Respiratory system: Good air movement and clear to auscultation. Cardiovascular system: S1 & S2 heard, RRR. No JVD. Gastrointestinal system: Abdomen is nondistended, soft and nontender.  Central nervous system: Alert and oriented. No focal neurological deficits. Extremities: No pedal edema. Skin: No rashes, lesions or ulcers Psychiatry: Judgement and insight appear normal. Mood & affect appropriate.    Data Reviewed:    Labs: Basic Metabolic Panel: Recent Labs  Lab 01/13/19 0625 01/14/19 0133 01/15/19 0415 01/16/19 0400 01/17/19 0425  NA 133* 134* 131* 133* 133*  K 4.0 4.9 5.2* 4.4 4.2  CL 102 104 101 102 102  CO2 22 21* 22 23 23   GLUCOSE 118* 112* 97 109* 104*  BUN 20 16 23 22  26*  CREATININE 0.98 0.93 1.03 0.99 0.99  CALCIUM 8.4* 8.7* 8.8* 8.6* 8.5*  MG 2.0 2.1 2.0 2.0 2.0  PHOS 2.7 3.1 3.8 4.2 4.5   GFR Estimated Creatinine Clearance: 108.3 mL/min (by C-G formula based on SCr of 0.99 mg/dL). Liver Function Tests: Recent Labs  Lab 01/13/19 0625 01/14/19 0133 01/15/19 0415 01/16/19 0400 01/17/19 0425  AST 32 28 27 25  23  ALT 53* 49* 52* 48* 48*  ALKPHOS 56 49 49 46 41  BILITOT 0.9 0.6 0.7 0.6 0.8  PROT 7.1 6.7 6.4* 6.1* 6.3*  ALBUMIN 3.1* 3.0* 3.1* 2.9* 3.1*   No results for input(s): LIPASE, AMYLASE in the last 168 hours. No results for input(s): AMMONIA in the last 168 hours. Coagulation profile Recent Labs  Lab 01/17/19 0935  INR 1.1   COVID-19 Labs  Recent Labs    01/16/19 0400 01/17/19 0415 01/17/19 0425 01/17/19 0925  DDIMER 3.76*  --  3.16* 3.09*  FERRITIN 692* 777*  --   --   CRP <0.8 <0.8  --   --     Lab Results  Component Value Date   SARSCOV2NAA POSITIVE (A) 01/05/2019    CBC: Recent Labs  Lab 01/13/19  0625 01/14/19 0133 01/15/19 0415 01/16/19 0400 01/17/19 0425  WBC 15.0* 10.4 9.4 10.8* 10.3  NEUTROABS 12.6* 8.8* 7.6 8.7* 8.1*  HGB 14.8 13.8 12.9* 12.6* 12.8*  HCT 45.2 42.6 40.2 38.9* 39.7  MCV 91.1 91.4 91.6 92.6 91.3  PLT 215 212 222 211 207   Cardiac Enzymes: No results for input(s): CKTOTAL, CKMB, CKMBINDEX, TROPONINI in the last 168 hours. BNP (last 3 results) No results for input(s): PROBNP in the last 8760 hours. CBG: Recent Labs  Lab 01/16/19 2051 01/17/19 0737 01/17/19 1144 01/17/19 1621 01/17/19 2059  GLUCAP 151* 91 120* 110* 129*   D-Dimer: Recent Labs    01/17/19 0425 01/17/19 0925  DDIMER 3.16* 3.09*   Hgb A1c: No results for input(s): HGBA1C in the last 72 hours. Lipid Profile: No results for input(s): CHOL, HDL, LDLCALC, TRIG, CHOLHDL, LDLDIRECT in the last 72 hours. Thyroid function studies: No results for input(s): TSH, T4TOTAL, T3FREE, THYROIDAB in the last 72 hours.  Invalid input(s): FREET3 Anemia work up: Recent Labs    01/16/19 0400 01/17/19 0415  FERRITIN 692* 777*   Sepsis Labs: Recent Labs  Lab 01/12/19 1131 01/12/19 1253  01/13/19 0659 01/14/19 0133 01/15/19 0415 01/16/19 0400 01/17/19 0425  PROCALCITON <0.10  --   --  <0.10 <0.10 <0.10  --   --   WBC 7.4  --    < >  --  10.4 9.4 10.8* 10.3  LATICACIDVEN  --  2.1*  --   --   --   --   --   --    < > = values in this interval not displayed.   Microbiology Recent Results (from the past 240 hour(s))  Blood Culture (routine x 2)     Status: None   Collection Time: 01/12/19 11:36 AM   Specimen: BLOOD  Result Value Ref Range Status   Specimen Description BLOOD RIGHT ANTECUBITAL  Final   Special Requests   Final    BOTTLES DRAWN AEROBIC AND ANAEROBIC Blood Culture results may not be optimal due to an inadequate volume of blood received in culture bottles   Culture   Final    NO GROWTH 5 DAYS Performed at [facility], [place] [address], [place], [place] [address]     Report Status 01/17/2019 FINAL  Final     Medications:   . albuterol  2 puff Inhalation Q6H  . atorvastatin  60 mg Oral Daily  . dexamethasone  6 mg Oral Q24H  . famotidine  20 mg Oral BID  . insulin aspart  0-9 Units Subcutaneous TID WC  . levothyroxine  100 mcg Oral QAC breakfast  . lisinopril  5  mg Oral Daily  . sodium chloride flush  3 mL Intravenous Q12H  . vitamin C  500 mg Oral Daily  . zinc sulfate  220 mg Oral Daily   Continuous Infusions: . heparin 1,700 Units/hr (01/18/19 0336)      LOS: 6 days   Charlynne Cousins  [facility]  01/18/2019, 8:21 AM

## 2019-01-18 NOTE — Plan of Care (Signed)
  Problem: Education: Goal: Knowledge of risk factors and measures for prevention of condition will improve 01/18/2019 1143 by Orvan Falconer, RN Outcome: Progressing 01/18/2019 1143 by Orvan Falconer, RN Outcome: Progressing   Problem: Coping: Goal: Psychosocial and spiritual needs will be supported 01/18/2019 1143 by Orvan Falconer, RN Outcome: Progressing 01/18/2019 1143 by Orvan Falconer, RN Outcome: Progressing   Problem: Respiratory: Goal: Will maintain a patent airway 01/18/2019 1143 by Orvan Falconer, RN Outcome: Progressing 01/18/2019 1143 by Orvan Falconer, RN Outcome: Progressing Goal: Complications related to the disease process, condition or treatment will be avoided or minimized 01/18/2019 1143 by Orvan Falconer, RN Outcome: Progressing 01/18/2019 1143 by Orvan Falconer, RN Outcome: Progressing   Problem: Education: Goal: Knowledge of General Education information will improve Description: Including pain rating scale, medication(s)/side effects and non-pharmacologic comfort measures 01/18/2019 1143 by Orvan Falconer, RN Outcome: Progressing 01/18/2019 1143 by Orvan Falconer, RN Outcome: Progressing   Problem: Health Behavior/Discharge Planning: Goal: Ability to manage health-related needs will improve 01/18/2019 1143 by Orvan Falconer, RN Outcome: Progressing 01/18/2019 1143 by Orvan Falconer, RN Outcome: Progressing   Problem: Clinical Measurements: Goal: Ability to maintain clinical measurements within normal limits will improve 01/18/2019 1143 by Orvan Falconer, RN Outcome: Progressing 01/18/2019 1143 by Orvan Falconer, RN Outcome: Progressing Goal: Will remain free from infection 01/18/2019 1143 by Orvan Falconer, RN Outcome: Progressing 01/18/2019 1143 by Orvan Falconer, RN Outcome: Progressing Goal: Diagnostic test results will improve 01/18/2019 1143 by Orvan Falconer, RN Outcome:  Progressing 01/18/2019 1143 by Orvan Falconer, RN Outcome: Progressing Goal: Respiratory complications will improve 01/18/2019 1143 by Orvan Falconer, RN Outcome: Progressing 01/18/2019 1143 by Orvan Falconer, RN Outcome: Progressing Goal: Cardiovascular complication will be avoided 01/18/2019 1143 by Orvan Falconer, RN Outcome: Progressing 01/18/2019 1143 by Orvan Falconer, RN Outcome: Progressing   Problem: Activity: Goal: Risk for activity intolerance will decrease 01/18/2019 1143 by Orvan Falconer, RN Outcome: Progressing 01/18/2019 1143 by Orvan Falconer, RN Outcome: Progressing   Problem: Nutrition: Goal: Adequate nutrition will be maintained 01/18/2019 1143 by Orvan Falconer, RN Outcome: Progressing 01/18/2019 1143 by Orvan Falconer, RN Outcome: Progressing   Problem: Coping: Goal: Level of anxiety will decrease 01/18/2019 1143 by Orvan Falconer, RN Outcome: Progressing 01/18/2019 1143 by Orvan Falconer, RN Outcome: Progressing   Problem: Elimination: Goal: Will not experience complications related to bowel motility 01/18/2019 1143 by Orvan Falconer, RN Outcome: Progressing 01/18/2019 1143 by Orvan Falconer, RN Outcome: Progressing Goal: Will not experience complications related to urinary retention 01/18/2019 1143 by Orvan Falconer, RN Outcome: Progressing 01/18/2019 1143 by Orvan Falconer, RN Outcome: Progressing   Problem: Pain Managment: Goal: General experience of comfort will improve 01/18/2019 1143 by Orvan Falconer, RN Outcome: Progressing 01/18/2019 1143 by Orvan Falconer, RN Outcome: Progressing   Problem: Safety: Goal: Ability to remain free from injury will improve 01/18/2019 1143 by Orvan Falconer, RN Outcome: Progressing 01/18/2019 1143 by Orvan Falconer, RN Outcome: Progressing   Problem: Skin Integrity: Goal: Risk for impaired skin integrity will decrease 01/18/2019 1143 by  Orvan Falconer, RN Outcome: Progressing 01/18/2019 1143 by Orvan Falconer, RN Outcome: Progressing

## 2019-01-18 NOTE — Progress Notes (Signed)
Venous duplex lower ext.  has been completed. Refer to [facility] under chart review to view preliminary results.   01/18/2019  4:22 PM Del Wiseman, Bonnye Fava

## 2019-01-18 NOTE — Progress Notes (Signed)
A/O no noted respiratory distress. Pt was a little upset after he was able to go. Educated pt, now he is thankful the doctor made the decision. Pt slept on his side. Staff will continue to monitor and meet needs. Informed daughter of pt's status.

## 2019-01-18 NOTE — Plan of Care (Signed)

## 2019-01-19 DIAGNOSIS — I2699 Other pulmonary embolism without acute cor pulmonale: Secondary | ICD-10-CM

## 2019-01-19 LAB — CBC
HCT: 37.4 % — ABNORMAL LOW (ref 39.0–52.0)
Hemoglobin: 12.1 g/dL — ABNORMAL LOW (ref 13.0–17.0)
MCH: 29.7 pg (ref 26.0–34.0)
MCHC: 32.4 g/dL (ref 30.0–36.0)
MCV: 91.9 fL (ref 80.0–100.0)
Platelets: 207 10*3/uL (ref 150–400)
RBC: 4.07 MIL/uL — ABNORMAL LOW (ref 4.22–5.81)
RDW: 15.9 % — ABNORMAL HIGH (ref 11.5–15.5)
WBC: 12.1 10*3/uL — ABNORMAL HIGH (ref 4.0–10.5)
nRBC: 0 % (ref 0.0–0.2)

## 2019-01-19 LAB — GLUCOSE, CAPILLARY
Glucose-Capillary: 89 mg/dL (ref 70–99)
Glucose-Capillary: 93 mg/dL (ref 70–99)

## 2019-01-19 LAB — D-DIMER, QUANTITATIVE: D-Dimer, Quant: 1.74 ug/mL-FEU — ABNORMAL HIGH (ref 0.00–0.50)

## 2019-01-19 MED ORDER — APIXABAN 5 MG PO TABS: 5.0000 mg | ORAL_TABLET | Freq: Two times a day (BID) | ORAL | Status: DC

## 2019-01-19 MED ORDER — APIXABAN 5 MG PO TABS: 5.0000 mg | ORAL_TABLET | Freq: Two times a day (BID) | ORAL | 3 refills | Status: DC

## 2019-01-19 MED ORDER — APIXABAN 5 MG PO TABS: 10.0000 mg | ORAL_TABLET | Freq: Two times a day (BID) | ORAL | 0 refills | Status: DC

## 2019-01-19 MED ORDER — LISINOPRIL 5 MG PO TABS: 5.0000 mg | ORAL_TABLET | Freq: Every day | ORAL | 0 refills | Status: DC

## 2019-01-19 MED ORDER — APIXABAN 5 MG PO TABS
10.0000 mg | ORAL_TABLET | Freq: Two times a day (BID) | ORAL | Status: DC
  Administered 2019-01-19: 10 mg via ORAL
  Filled 2019-01-19: qty 2

## 2019-01-19 NOTE — Discharge Instructions (Signed)
Information on my medicine - ELIQUIS (apixaban)  This medication education was reviewed with me or my healthcare representative as part of my discharge preparation.  The pharmacist that spoke with me during my hospital stay was:  Suryansh, [facility]  Why was Eliquis prescribed for you? Eliquis was prescribed to treat blood clots that may have been found in the veins of your legs (deep vein thrombosis) or in your lungs (pulmonary embolism) and to reduce the risk of them occurring again.  What do You need to know about Eliquis ? The starting dose is 10 mg (two 5 mg tablets) taken TWICE daily for the FIRST SEVEN (7) DAYS, then on  11/13  the dose is reduced to ONE 5 mg tablet taken TWICE daily.  Eliquis may be taken with or without food.   Try to take the dose about the same time in the morning and in the evening. If you have difficulty swallowing the tablet whole please discuss with your pharmacist how to take the medication safely.  Take Eliquis exactly as prescribed and DO NOT stop taking Eliquis without talking to the doctor who prescribed the medication.  Stopping may increase your risk of developing a new blood clot.  Refill your prescription before you run out.  After discharge, you should have regular check-up appointments with your healthcare provider that is prescribing your Eliquis.    What do you do if you miss a dose? If a dose of ELIQUIS is not taken at the scheduled time, take it as soon as possible on the same day and twice-daily administration should be resumed. The dose should not be doubled to make up for a missed dose.  Important Safety Information A possible side effect of Eliquis is bleeding. You should call your healthcare provider right away if you experience any of the following: ? Bleeding from an injury or your nose that does not stop. ? Unusual colored urine (red or dark brown) or unusual colored stools (red or black). ? Unusual bruising for unknown  reasons. ? A serious fall or if you hit your head (even if there is no bleeding).  Some medicines may interact with Eliquis and might increase your risk of bleeding or clotting while on Eliquis. To help avoid this, consult your healthcare provider or pharmacist prior to using any new prescription or non-prescription medications, including herbals, vitamins, non-steroidal anti-inflammatory drugs (NSAIDs) and supplements.  This website has more information on Eliquis (apixaban): http://www.eliquis.com/eliquis/home

## 2019-01-19 NOTE — Progress Notes (Signed)
Pt. O2 90% while ambulating

## 2019-01-19 NOTE — Discharge Summary (Signed)
Physician Discharge Summary  Larry Richmond [id number] DOB: [birth year] DOA: 01/12/2019  PCP: Default, Provider, MD  Admit date: 01/12/2019 Discharge date: 01/19/2019  Admitted From: home Disposition:  Home  Recommendations for Outpatient Follow-up:  1. Follow up with PCP in 1-2 weeks 2. Please obtain BMP/CBC in one week   Home Health:No Equipment/Devices:None  Discharge Condition:stable CODE STATUS:Full Diet recommendation: Heart Healthy   Brief/Interim Summary: 68 y.o. male past medical history of essential hypertension, diabetes mellitus type 2 controlled, hyperlipidemia, chronic respiratory failure with hypoxia tobacco use disorder, COPD and obstructive sleep apnea, morbid obesity status post thyroidectomy and noncompliant presents with persistent cough and shortness of breath was hospitalized from 01/05/2019 to 01/11/2019 for COVID-19 viral pneumonia.  During this hospitalization he received 5 days remdesivir plasmic tremor and steroids.  He also completed 10-day course of antibiotics as an outpatient.  At the time of discharge he did not require antibiotics however after going home he reports a worsening cough unable to sleep at night fever headache nausea vomiting and diarrhea so he decided to come back to the ED.  The ED saturations were 92% respiration was 26-29 on 2 L of nasal cannula and lactic acid of 2.1 chest x-ray showed worsening multifocal pneumonia  Discharge Diagnoses:  Principal Problem:   Pneumonia due to COVID-19 virus Active Problems:   Tobacco use disorder   Acute on chronic respiratory failure with hypoxia (HCC)   Hypothyroidism   Morbid obesity (HCC)   HLD (hyperlipidemia)   Lactic acidosis   Diabetes mellitus type 2, controlled (HCC)   Benign essential HTN   COPD (chronic obstructive pulmonary disease) (HCC)   OSA on CPAP   Medically noncompliant   Acute pulmonary embolism ([place]) Acute respiratory failure with hypoxia due to acute pulmonary  embolism: On admission he was tachycardic and hypoxic with inflammatory markers specially D-dimer significantly elevated greater than 20, compared to when he was discharged from the hospital which was 0.8. A CT scan with contrast to rule out a PE was done on 01/17/2019 that showed subsegmental PE in the right lower lobe.  He was started on IV per in which he tolerated.  He was transitioned to oral Eliquis with you continue as an outpatient for total 6 months.   Lower extremity Doppler was negative.  Tobacco use: Continue smoke 1 pack/day.  COPD: Remained stable no wheezing on physical exam.  Obstructive sleep apnea noncompliant with his machine.  Essential hypertension: Continue current medications no changes made.  Diabetes mellitus type 2 with complications: Hemoglobin A1c was 6.0 continue Metformin as an outpatient.  Hyperlipidemia: Excellent continue statin therapy.  Hypothyroidism: Continue Synthroid.   Discharge Instructions  Discharge Instructions    Diet - low sodium heart healthy   Complete by: As directed    Increase activity slowly   Complete by: As directed      Allergies as of 01/19/2019   No Known Allergies     Medication List    STOP taking these medications   levofloxacin 500 MG tablet Commonly known as: Levaquin     TAKE these medications   acetaminophen 500 MG tablet Commonly known as: TYLENOL Take 1 tablet (500 mg total) by mouth every 6 (six) hours as needed. What changed:   when to take this  reasons to take this   apixaban 5 MG Tabs tablet Commonly known as: ELIQUIS Take 2 tablets (10 mg total) by mouth 2 (two) times daily.   apixaban 5 MG Tabs tablet Commonly known as:  ELIQUIS Take 1 tablet (5 mg total) by mouth 2 (two) times daily. Start taking on: January 26, 2019   atorvastatin 40 MG tablet Commonly known as: LIPITOR Take 40 mg by mouth daily.   dexamethasone 6 MG tablet Commonly known as: DECADRON Take 1 tablet (6 mg  total) by mouth daily.   diphenoxylate-atropine 2.5-0.025 MG tablet Commonly known as: LOMOTIL Take 1 tablet by mouth 4 (four) times daily as needed for diarrhea or loose stools.   hydrOXYzine 25 MG tablet Commonly known as: ATARAX/VISTARIL Take 1 tablet (25 mg total) by mouth at bedtime as needed for up to 21 days (Sleep). What changed: reasons to take this   levothyroxine 100 MCG tablet Commonly known as: SYNTHROID Take 100 mcg by mouth daily before breakfast.   lisinopril 5 MG tablet Commonly known as: ZESTRIL Take 1 tablet (5 mg total) by mouth daily. Start taking on: January 20, 2019   metFORMIN 500 MG tablet Commonly known as: GLUCOPHAGE Take 500 mg by mouth daily with breakfast.       No Known Allergies  Consultations:  None   Procedures/Studies: Ct Angio Chest Pe W Or Wo Contrast  Addendum Date: 01/17/2019   ADDENDUM REPORT: 01/17/2019 11:49 ADDENDUM: Critical Value/emergent results were called by telephone at the time of interpretation on 01/17/2019 at 11:34 a.m. to [place] , who verbally acknowledged these results. Electronically Signed   By: Marin Olp M.D.   On: 01/17/2019 11:49   Result Date: 01/17/2019 CLINICAL DATA:  Shortness of breath. Suspect heart failure. Elevated D-dimer. EXAM: CT ANGIOGRAPHY CHEST WITH CONTRAST TECHNIQUE: Multidetector CT imaging of the chest was performed using the standard protocol during bolus administration of intravenous contrast. Multiplanar CT image reconstructions and MIPs were obtained to evaluate the vascular anatomy. CONTRAST:  130mL OMNIPAQUE IOHEXOL 350 MG/ML SOLN COMPARISON:  02/17/2016 FINDINGS: Cardiovascular: Thoracic aorta is normal caliber without aneurysm or dissection. There is mild calcified plaque over the thoracic aorta. Calcified plaque over the left main, left anterior descending and right coronary arteries. Pulmonary arterial system is well opacified. Suggestion of a couple tiny peripheral  subsegmental emboli over the right lower lobar pulmonary arteries. Mediastinum/Nodes: No mediastinal or hilar adenopathy. Remaining mediastinal structures are unremarkable. Previous thyroidectomy. Lungs/Pleura: Emphysematous bleb over the right apex unchanged. Lungs are adequately inflated demonstrate patchy bilateral airspace process with peripheral and peribronchovascular predilection. Findings likely due to infection in this COVID-19 positive patient. No effusion. Airways are otherwise unremarkable. Upper Abdomen: Stable 2 cm left adrenal nodule likely adenoma. No acute findings. Musculoskeletal: No acute findings. Review of the MIP images confirms the above findings. IMPRESSION: 1. Patchy bilateral airspace process likely multifocal pneumonia in this COVID-19 positive patient. 2. Subtle findings likely representing small subsegmental pulmonary emboli over right lower lobe pulmonary arteries. 3. Aortic Atherosclerosis (ICD10-I70.0) and Emphysema (ICD10-J43.9). Atherosclerotic coronary artery disease. 4.  Stable 2 cm left adrenal nodule likely an adenoma. Electronically Signed: By: Marin Olp M.D. On: 01/17/2019 11:21   Ct Abdomen Pelvis W Contrast  Result Date: 01/02/2019 CLINICAL DATA:  Acute abdominal pain EXAM: CT ABDOMEN AND PELVIS WITH CONTRAST TECHNIQUE: Multidetector CT imaging of the abdomen and pelvis was performed using the standard protocol following bolus administration of intravenous contrast. CONTRAST:  18mL OMNIPAQUE IOHEXOL 300 MG/ML  SOLN COMPARISON:  CT scan 02/18/2016 FINDINGS: Lower chest: The heart is normal in size. No pericardial effusion. Coronary artery calcifications are noted. There are patchy basilar scarring changes but no acute pulmonary findings or worrisome pulmonary  lesions. Hepatobiliary: A few small low-attenuation liver lesions appears stable and likely benign cyst. There is also a vague lesion in segment 7 on image number 17 measuring approximately 2 cm. It is  difficult to see on the prior CT scan. It could be a hemangioma. There is also mild diffuse fatty infiltration of the liver and this could be an area of more focal fat. No intra or extrahepatic biliary dilatation. The gallbladder appears normal. Pancreas: No mass, inflammation or ductal dilatation. Scattered parenchymal calcifications may suggest prior pancreatitis. Spleen: Normal size.  No focal lesions. Adrenals/Urinary Tract: Stable bilateral adrenal gland nodules most consistent with benign adenomas. Simple appearing left renal cyst. No worrisome renal lesions or hydroureteronephrosis. The bladder is unremarkable. Stomach/Bowel: The stomach, duodenum, small bowel and colon are unremarkable. No acute inflammatory changes, mass lesions or obstructive findings. The terminal ileum and appendix are normal. No significant diverticulosis or findings for acute diverticulitis. Vascular/Lymphatic: Age advanced atherosclerotic calcifications involving the aorta and iliac arteries. No aneurysm or focal dissection. The branch vessels are patent. The major venous structures are patent. No mesenteric or retroperitoneal mass or adenopathy. Reproductive: The prostate gland and seminal vesicles are unremarkable. Other: No pelvic mass or adenopathy. No free pelvic fluid collections. No inguinal mass or adenopathy. No abdominal wall hernia or subcutaneous lesions. Musculoskeletal: No significant bony findings. Stable benign lipoma between the gluteus minimus and medius muscles on the left. IMPRESSION: 1. No acute abdominal/pelvic findings. 2. Small scattered low-attenuation liver lesions, likely benign cysts. There is an indeterminate 2 cm lesion in the right hepatic lobe. Recommend MRI abdomen without and with contrast for further evaluation. 3. Stable benign-appearing bilateral adrenal gland adenomas. 4. Mild diffuse fatty infiltration of the liver. 5. Advanced vascular calcifications but no aneurysm or dissection or occlusion.  Electronically Signed   By: Marijo Sanes M.D.   On: 01/02/2019 15:47   Dg Chest Portable 1 View  Result Date: 01/12/2019 CLINICAL DATA:  Shortness of breath.  COVID positive. EXAM: PORTABLE CHEST 1 VIEW COMPARISON:  January 05, 2019 FINDINGS: There is airspace consolidation throughout both lower lobe regions which appear similar on the right and slightly increased on the left compared to 1 week prior. There is new airspace opacity in the right upper lobe. There is stable scarring in the right upper lobe with a large focal bulla in the right upper lobe toward the apex. Left upper lobe is clear. There is mild cardiomegaly with pulmonary vascularity normal. No adenopathy. No bone lesions. IMPRESSION: Multifocal airspace opacity consistent with widespread pneumonia. Increase in opacity in the left lower lung region and new increased opacity in the right upper lobe. Stable opacity right lower lung region. Stable scarring and prominent bulla in the right upper lobe. Stable cardiac prominence.  No evident adenopathy. Electronically Signed   By: Lowella Grip III M.D.   On: 01/12/2019 11:52   Dg Chest Port 1 View  Result Date: 01/05/2019 CLINICAL DATA:  Shortness of breath EXAM: PORTABLE CHEST 1 VIEW COMPARISON:  01/02/2019 FINDINGS: Bullous emphysematous changes within the lungs. Increased markings in the lung bases, likely scarring although acute infiltrates cannot be completely excluded. Heart is normal size. No effusions. IMPRESSION: COPD/chronic changes. Increased markings in the lung bases could reflect scarring although superimposed acute infiltrates cannot be completely excluded. Electronically Signed   By: Rolm Baptise M.D.   On: 01/05/2019 12:26   Dg Chest Portable 1 View  Result Date: 01/02/2019 CLINICAL DATA:  Epigastric abdominal pain and left chest pain.  EXAM: PORTABLE CHEST 1 VIEW COMPARISON:  Chest x-ray 08/10/2015 FINDINGS: The heart is borderline enlarged. There is mild tortuosity and  calcification of the thoracic aorta. Chronic appearing lung changes with bullous emphysema and areas of pulmonary scarring. There are bibasilar scarring changes without definite superimposed infiltrates or effusions. 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 No definite acute overlying pulmonary process. Electronically Signed   By: Marijo Sanes M.D.   On: 01/02/2019 13:22   Vas [place] Lower Extremity Venous (dvt)  Result Date: 01/18/2019  Lower Venous Study Indications: Covid positive.  Risk Factors: Confirmed PE. Limitations: Body habitus. Comparison Study: No priors. Performing Technologist: Oda Cogan RDMS, RVT  Examination Guidelines: A complete evaluation includes B-mode imaging, spectral Doppler, color Doppler, and power Doppler as needed of all accessible portions of each vessel. Bilateral testing is considered an integral part of a complete examination. Limited examinations for reoccurring indications may be performed as noted.  +---------+---------------+---------+-----------+----------+-----------------+ RIGHT    CompressibilityPhasicitySpontaneityPropertiesThrombus Aging    +---------+---------------+---------+-----------+----------+-----------------+ CFV      Full           Yes      Yes                                    +---------+---------------+---------+-----------+----------+-----------------+ SFJ      Full                                                           +---------+---------------+---------+-----------+----------+-----------------+ FV Prox  Full                                                           +---------+---------------+---------+-----------+----------+-----------------+ FV Mid   Full                                                           +---------+---------------+---------+-----------+----------+-----------------+ FV DistalFull                                                            +---------+---------------+---------+-----------+----------+-----------------+ PFV      Full                                                           +---------+---------------+---------+-----------+----------+-----------------+ POP      Full           Yes      Yes                                    +---------+---------------+---------+-----------+----------+-----------------+  PTV                                                   poorly visualized +---------+---------------+---------+-----------+----------+-----------------+ PERO                                                  poorly visualized +---------+---------------+---------+-----------+----------+-----------------+   +---------+---------------+---------+-----------+----------+-----------------+ LEFT     CompressibilityPhasicitySpontaneityPropertiesThrombus Aging    +---------+---------------+---------+-----------+----------+-----------------+ CFV      Full           Yes      Yes                                    +---------+---------------+---------+-----------+----------+-----------------+ SFJ      Full                                                           +---------+---------------+---------+-----------+----------+-----------------+ FV Prox  Full                                                           +---------+---------------+---------+-----------+----------+-----------------+ FV Mid   Full                                                           +---------+---------------+---------+-----------+----------+-----------------+ FV DistalFull                                                           +---------+---------------+---------+-----------+----------+-----------------+ PFV      Full                                                           +---------+---------------+---------+-----------+----------+-----------------+ POP      Full           Yes      Yes                                     +---------+---------------+---------+-----------+----------+-----------------+ PTV  poorly visualized +---------+---------------+---------+-----------+----------+-----------------+ PERO                                                  poorly visualized +---------+---------------+---------+-----------+----------+-----------------+     Summary: Right: There is no evidence of deep vein thrombosis in the lower extremity. However, portions of this examination were limited- see technologist comments above. Left: There is no evidence of deep vein thrombosis proximal to the inguinal ligament or in the common femoral vein.  *See table(s) above for measurements and observations.    Preliminary     Subjective: No complaints feels great happy to be better, he is also happy that he is going home today.  Discharge Exam: Vitals:   01/19/19 0648 01/19/19 0826  BP:  131/77  Pulse:  60  Resp: 17 17  Temp: (!) 97.5 F (36.4 C) 97.8 F (36.6 C)  SpO2: 95% 96%   Vitals:   01/18/19 0736 01/18/19 2011 01/19/19 0648 01/19/19 0826  BP: (!) 149/78 110/66  131/77  Pulse: 79   60  Resp: 18  17 17   Temp: 98.6 F (37 C) 98.1 F (36.7 C) (!) 97.5 F (36.4 C) 97.8 F (36.6 C)  TempSrc: Oral Oral Oral Oral  SpO2: 94%  95% 96%  Weight:      Height:        General: Pt is alert, awake, not in acute distress Cardiovascular: RRR, S1/S2 +, no rubs, no gallops Respiratory: CTA bilaterally, no wheezing, no rhonchi Abdominal: Soft, NT, ND, bowel sounds + Extremities: no edema, no cyanosis    The results of significant diagnostics from this hospitalization (including imaging, microbiology, ancillary and laboratory) are listed below for reference.     Microbiology: Recent Results (from the past 240 hour(s))  Blood Culture (routine x 2)     Status: None   Collection Time: 01/12/19 11:36 AM   Specimen: BLOOD  Result  Value Ref Range Status   Specimen Description BLOOD RIGHT ANTECUBITAL  Final   Special Requests   Final    BOTTLES DRAWN AEROBIC AND ANAEROBIC Blood Culture results may not be optimal due to an inadequate volume of blood received in culture bottles   Culture   Final    NO GROWTH 5 DAYS Performed at [facility], [place] [address], [place], [place] [address]    Report Status 01/17/2019 FINAL  Final     Labs: BNP (last 3 results) No results for input(s): BNP in the last 8760 hours. Basic Metabolic Panel: Recent Labs  Lab 01/13/19 0625 01/14/19 0133 01/15/19 0415 01/16/19 0400 01/17/19 0425  NA 133* 134* 131* 133* 133*  K 4.0 4.9 5.2* 4.4 4.2  CL 102 104 101 102 102  CO2 22 21* 22 23 23   GLUCOSE 118* 112* 97 109* 104*  BUN 20 16 23 22  26*  CREATININE 0.98 0.93 1.03 0.99 0.99  CALCIUM 8.4* 8.7* 8.8* 8.6* 8.5*  MG 2.0 2.1 2.0 2.0 2.0  PHOS 2.7 3.1 3.8 4.2 4.5   Liver Function Tests: Recent Labs  Lab 01/13/19 0625 01/14/19 0133 01/15/19 0415 01/16/19 0400 01/17/19 0425  AST 32 28 27 25 23   ALT 53* 49* 52* 48* 48*  ALKPHOS 56 49 49 46 41  BILITOT 0.9 0.6 0.7 0.6 0.8  PROT 7.1 6.7 6.4* 6.1* 6.3*  ALBUMIN 3.1* 3.0* 3.1* 2.9* 3.1*  No results for input(s): LIPASE, AMYLASE in the last 168 hours. No results for input(s): AMMONIA in the last 168 hours. CBC: Recent Labs  Lab 01/13/19 0625 01/14/19 0133 01/15/19 0415 01/16/19 0400 01/17/19 0425 01/18/19 0903 01/19/19 0135  WBC 15.0* 10.4 9.4 10.8* 10.3 15.9* 12.1*  NEUTROABS 12.6* 8.8* 7.6 8.7* 8.1*  --   --   HGB 14.8 13.8 12.9* 12.6* 12.8* 14.4 12.1*  HCT 45.2 42.6 40.2 38.9* 39.7 44.2 37.4*  MCV 91.1 91.4 91.6 92.6 91.3 91.7 91.9  PLT 215 212 222 211 207 237 207   Cardiac Enzymes: No results for input(s): CKTOTAL, CKMB, CKMBINDEX, TROPONINI in the last 168 hours. BNP: Invalid input(s): POCBNP CBG: Recent Labs  Lab 01/17/19 1621 01/17/19 2059 01/18/19 1152 01/18/19 2121 01/19/19 0825  GLUCAP 110*  129* 107* 115* 89   D-Dimer Recent Labs    01/18/19 0903 01/19/19 0135  DDIMER 2.64* 1.74*   Hgb A1c No results for input(s): HGBA1C in the last 72 hours. Lipid Profile No results for input(s): CHOL, HDL, LDLCALC, TRIG, CHOLHDL, LDLDIRECT in the last 72 hours. Thyroid function studies No results for input(s): TSH, T4TOTAL, T3FREE, THYROIDAB in the last 72 hours.  Invalid input(s): FREET3 Anemia work up Recent Labs    01/17/19 0415  FERRITIN 777*   Urinalysis    Component Value Date/Time   COLORURINE AMBER (A) 01/02/2019 1252   APPEARANCEUR HAZY (A) 01/02/2019 1252   LABSPEC 1.025 01/02/2019 1252   PHURINE 5.0 01/02/2019 1252   GLUCOSEU NEGATIVE 01/02/2019 1252   [place] 01/02/2019 1252   [place] 01/02/2019 1252   KETONESUR 5 (A) 01/02/2019 1252   PROTEINUR 30 (A) 01/02/2019 1252   NITRITE NEGATIVE 01/02/2019 1252   LEUKOCYTESUR NEGATIVE 01/02/2019 1252   Sepsis Labs Invalid input(s): PROCALCITONIN,  WBC,  LACTICIDVEN Microbiology Recent Results (from the past 240 hour(s))  Blood Culture (routine x 2)     Status: None   Collection Time: 01/12/19 11:36 AM   Specimen: BLOOD  Result Value Ref Range Status   Specimen Description BLOOD RIGHT ANTECUBITAL  Final   Special Requests   Final    BOTTLES DRAWN AEROBIC AND ANAEROBIC Blood Culture results may not be optimal due to an inadequate volume of blood received in culture bottles   Culture   Final    NO GROWTH 5 DAYS Performed at [facility], [place] [address] [place], [place] [address]    Report Status 01/17/2019 FINAL  Final     Time coordinating discharge: Over 40 minutes  SIGNED:   Charlynne Cousins, MD  [facility] 01/19/2019, 11:56 AM Pager   If 7PM-7AM, please contact night-coverage [email or web address] Password [email or web address]

## 2019-01-19 NOTE — Progress Notes (Signed)
ANTICOAGULATION CONSULT NOTE - Initial Consult  Pharmacy Consult for Apixaban Indication: pulmonary embolus  No Known Allergies  Patient Measurements: Height: 6\' 2"  (188 cm) Weight: (!) 336 lb 3.2 oz (152.5 kg) IBW/kg (Calculated) : 82.2   Vital Signs: Temp: 97.8 F (36.6 C) (11/06 0826) Temp Source: Oral (11/06 0826) BP: 131/77 (11/06 0826) Pulse Rate: 60 (11/06 0826)  Labs: Recent Labs    01/17/19 0425 01/17/19 0935 01/17/19 2250 01/18/19 0903 01/18/19 2205 01/19/19 0135  HGB 12.8*  --   --  14.4  --  12.1*  HCT 39.7  --   --  44.2  --  37.4*  PLT 207  --   --  237  --  207  APTT  --  26  --   --   --   --   LABPROT  --  14.4  --   --   --   --   INR  --  1.1  --   --   --   --   HEPARINUNFRC  --   --  0.83* 0.98* 0.31  --   CREATININE 0.99  --   --   --   --   --     Estimated Creatinine Clearance: 108.3 mL/min (by C-G formula based on SCr of 0.99 mg/dL).   Medical History: Past Medical History:  Diagnosis Date  . Benign essential HTN 01/13/2019  . Diabetes mellitus without complication ([place])   . GERD (gastroesophageal reflux disease)   . Hyperlipidemia   . Thyroid disease    Assessment: 68 y/o admitted with COVID-19 PNA developed PE. Patient has been on IV UFH with plans to transition to apixaban and d/c home today.    Plan:  - Will initiate apixaban 10 mg bid x 7 days followed by 5 mg bid - Will discontinue heparin drip at the time of first apixaban dose.  - Patient education  Jonluke, Jinkins 01/19/2019,9:01 AM

## 2019-11-02 ENCOUNTER — Telehealth: Payer: Self-pay | Admitting: Gastroenterology

## 2019-11-02 NOTE — Telephone Encounter (Signed)
Hi Dr. Fuller Plan,   We received a referral from [facility] for patient to have a repeat colonoscopy. Patient had one done in 2017. I obtained op\path reports in addition to his records for you to review.  Please advise on scheduling.  Thank you

## 2019-11-07 NOTE — Telephone Encounter (Signed)
I do not have the records to review.

## 2019-11-08 NOTE — Telephone Encounter (Signed)
Received records back from Dr. Fuller Plan, okay to schedule colonoscopy- with extended bowel prep. Called patient, no answer and no voicemail, phone just rings. Will try patient back.

## 2020-01-21 ENCOUNTER — Encounter: Payer: Self-pay | Admitting: Gastroenterology

## 2020-01-21 ENCOUNTER — Ambulatory Visit (INDEPENDENT_AMBULATORY_CARE_PROVIDER_SITE_OTHER): Payer: No Typology Code available for payment source | Admitting: Gastroenterology

## 2020-01-21 VITALS — BP 154/82 | HR 64 | Ht 72.5 in | Wt 369.0 lb

## 2020-01-21 DIAGNOSIS — Z86010 Personal history of colonic polyps: Secondary | ICD-10-CM | POA: Diagnosis not present

## 2020-01-21 DIAGNOSIS — Z7901 Long term (current) use of anticoagulants: Secondary | ICD-10-CM

## 2020-01-21 DIAGNOSIS — J9610 Chronic respiratory failure, unspecified whether with hypoxia or hypercapnia: Secondary | ICD-10-CM

## 2020-01-21 DIAGNOSIS — K219 Gastro-esophageal reflux disease without esophagitis: Secondary | ICD-10-CM | POA: Diagnosis not present

## 2020-01-21 MED ORDER — NA SULFATE-K SULFATE-MG SULF 17.5-3.13-1.6 GM/177ML PO SOLN: 1.0000 | Freq: Once | ORAL | 0 refills | Status: DC

## 2020-01-21 MED ORDER — NA SULFATE-K SULFATE-MG SULF 17.5-3.13-1.6 GM/177ML PO SOLN: 1.0000 | Freq: Once | ORAL | 0 refills | Status: AC

## 2020-01-21 NOTE — Patient Instructions (Signed)
If you are age 71 or older, your body mass index should be between 23-30. Your Body mass index is 49.36 kg/m. If this is out of the aforementioned range listed, please consider follow up with your Primary Care Provider.  If you are age 14 or younger, your body mass index should be between 19-25. Your Body mass index is 49.36 kg/m. If this is out of the aformentioned range listed, please consider follow up with your Primary Care Provider.   You have been scheduled for a colonoscopy. Please follow written instructions given to you at your visit today.  Please pick up your prep supplies at the pharmacy within the next 1-3 days. If you use inhalers (even only as needed), please bring them with you on the day of your procedure.  Due to recent changes in healthcare laws, you may see the results of your imaging and laboratory studies on MyChart before your provider has had a chance to review them.  We understand that in some cases there may be results that are confusing or concerning to you. Not all laboratory results come back in the same time frame and the provider may be waiting for multiple results in order to interpret others.  Please give [place] 48 hours in order for your provider to thoroughly review all the results before contacting the office for clarification of your results.   Thank you for choosing me and [facility].  Pricilla Riffle. Dagoberto Ligas., MD., Marval Regal

## 2020-01-21 NOTE — Progress Notes (Signed)
History of Present Illness: This is a 69 year old male referred by Ashok Norris, MD ([place]) for the evaluation of history of adenomatous colon polyps, GERD and intermittent upper abdominal pain/lower sternal pain.  He was referred from the [place] system specifically for colonoscopy.  Multiple comorbidities including COPD, chronic resp failure, OSA on CPAP, Covid-19 PNA in 2020, history of PE in 2020 maintained on Eliquis, morbid obesity, diabetes mellitus. Previous colonoscopy at the [facility] in December 2016 had a poor prep so he underwent a 2-day prep and had a repeat colonoscopy in June 2017 showing 6 small polyps 5 with tubular adenomas.  An EGD performed December 2016 showed hiatal hernia esophagitis and H. pylori gastritis.  He states he was treated for H. pylori although I do not have the records.  He is currently taking pantoprazole 40 mg twice daily with fair to good control of reflux symptoms.  He often has pain following meals that he localizes to his lower sternum and epigastrium.  He was hospitalized in October 2020 for Covid-19 pneumonia.  He states his respiratory symptoms have not returned to baseline following COVID-19.  CT scan of the abdomen and pelvis performed on January 02, 2019 during his hospitalization showed small scattered low-attenuation liver lesions, likely benign cysts, a 2 cm indeterminate lesion in the right hepatic lobe, stable bilateral benign-appearing adrenal gland adenomas, mild diffuse fatty infiltration of the liver, advanced vascular calcifications without aneurysm.  MRI abdomen with and without contrast was recommended after discharge.  He states he occasionally has loose stools due to certain food intolerances.  No other GI complaints. Denies weight loss, constipation, change in stool caliber, melena, hematochezia, nausea, vomiting, dysphagia.    No Known Allergies Outpatient Medications Prior to Visit  Medication Sig Dispense Refill  . acetaminophen  (TYLENOL) 500 MG tablet Take 1 tablet (500 mg total) by mouth every 6 (six) hours as needed. (Patient taking differently: Take 500 mg by mouth every 4 (four) hours as needed for mild pain or fever. ) 30 tablet 0  . apixaban (ELIQUIS) 5 MG TABS tablet Take 2 tablets (10 mg total) by mouth 2 (two) times daily. 14 tablet 0  . apixaban (ELIQUIS) 5 MG TABS tablet Take 1 tablet (5 mg total) by mouth 2 (two) times daily. 60 tablet 3  . atorvastatin (LIPITOR) 40 MG tablet Take 40 mg by mouth daily.    [name] dexamethasone (DECADRON) 6 MG tablet Take 1 tablet (6 mg total) by mouth daily. 4 tablet 0  . diphenoxylate-atropine (LOMOTIL) 2.5-0.025 MG tablet Take 1 tablet by mouth 4 (four) times daily as needed for diarrhea or loose stools.    [name] levothyroxine (SYNTHROID) 100 MCG tablet Take 100 mcg by mouth daily before breakfast.    . lisinopril (ZESTRIL) 5 MG tablet Take 1 tablet (5 mg total) by mouth daily. 30 tablet 0  . metFORMIN (GLUCOPHAGE) 500 MG tablet Take 500 mg by mouth daily with breakfast.     No facility-administered medications prior to visit.   Past Medical History:  Diagnosis Date  . Anxiety   . Arthritis   . Benign essential HTN 01/13/2019  . Colon polyps   . Depression   . Diabetes mellitus without complication ([place])   . GERD (gastroesophageal reflux disease)   . HTN (hypertension)   . Hyperlipidemia   . Obesity   . Pneumonia   . Pulmonary embolism ([place])   . Sleep apnea   . Thyroid disease   . Tubular  adenoma of colon 2017   Past Surgical History:  Procedure Laterality Date  . THYROID SURGERY     Social History   Socioeconomic History  . Marital status: Single    Spouse name: Not on file  . Number of children: 3  . Years of education: Not on file  . Highest education level: Not on file  Occupational History  . Not on file  Tobacco Use  . Smoking status: Current Some Day Smoker    Types: Cigarettes  . Smokeless tobacco: Never Used  . Tobacco comment: on occasions   Vaping Use  . Vaping Use: Never used  Substance and Sexual Activity  . Alcohol use: Yes    Comment: occasional  . Drug use: Yes    Types: Marijuana    Comment: past hx of cocaine.denies use now.  . Sexual activity: Not on file  Other Topics Concern  . Not on file  Social History Narrative  . Not on file   Social Determinants of Health   Financial Resource Strain:   . Difficulty of Paying Living Expenses: Not on file  Food Insecurity:   . Worried About Charity fundraiser in the Last Year: Not on file  . Ran Out of Food in the Last Year: Not on file  Transportation Needs:   . Lack of Transportation (Medical): Not on file  . Lack of Transportation (Non-Medical): Not on file  Physical Activity:   . Days of Exercise per Week: Not on file  . Minutes of Exercise per Session: Not on file  Stress:   . Feeling of Stress : Not on file  Social Connections:   . Frequency of Communication with Friends and Family: Not on file  . Frequency of Social Gatherings with Friends and Family: Not on file  . Attends Religious Services: Not on file  . Active Member of Clubs or Organizations: Not on file  . Attends Archivist Meetings: Not on file  . Marital Status: Not on file   Family History  Problem Relation Age of Onset  . Alzheimer's disease Mother   . Cancer Father        type unknown  . Kidney disease Father       Review of Systems: Pertinent positive and negative review of systems were noted in the above HPI section. All other review of systems were otherwise negative.   Physical Exam: General: Well developed, well nourished, obese, no acute distress Head: Normocephalic and atraumatic Eyes:  sclerae anicteric, EOMI Ears: Normal auditory acuity Mouth: Not examined, mask on during Covid-19 pandemic Neck: Supple, no masses or thyromegaly Lungs: Clear throughout to auscultation Heart: Regular rate and rhythm; no murmurs, rubs or bruits Abdomen: Soft, non tender and  non distended. No masses, hepatosplenomegaly or hernias noted. Normal Bowel sounds Rectal: Not done Musculoskeletal: Symmetrical with no gross deformities  Skin: No lesions on visible extremities Pulses:  Normal pulses noted Extremities: No clubbing, cyanosis, edema or deformities noted Neurological: Alert oriented x 4, grossly nonfocal Cervical Nodes:  No significant cervical adenopathy Inguinal Nodes: No significant inguinal adenopathy Psychological:  Alert and cooperative. Normal mood and affect   Assessment and Recommendations:  1. Personal history of adenomatous colon polyps.  Multiple adenomatous colon polyps ana a 3-year interval surveillance colonoscopy was recommended.  He is at higher risk for cardiopulmonary complications from colonoscopy and sedation, which is the reason he was referred from the [place] system.  In addition he has higher risk of post procedure  bleeding due to anticoagulation needs.  After a discussion he would like to proceed with colonoscopy.  Ample opportunity was allowed for questions which I addressed to his satisfaction.  The risks (including bleeding, perforation, infection, missed lesions, medication reactions and possible hospitalization or surgery if complications occur), benefits, and alternatives to colonoscopy with possible biopsy and possible polypectomy were discussed with the patient and they consent to proceed.   2. GERD, upper abdominal pain/lower sternal pain. Follow antireflux measures.  Continue pantoprazole 40 mg twice daily. Consider a trial of another PPI, defer to his [place], for [place] options.  A component of his symptoms could be musculoskeletal.  Rule out biliary causes.  Return to his [place] for further evaluation with consideration of abdominal ultrasound or abdominal MRI/MRCP see #5.  3.  History of H. pylori gastritis apparently treated in 2017.  4.  COPD, OSA on CPAP, history of Covid-19 PNA, chronic respiratory failure.  Follow up with PCP.   5.  Abnormal CT of the liver with small scattered low-attenuation liver lesions, likely benign cysts and a indeterminate 2 cm lesion in the right hepatic lobe.  MRI of the abdomen with and without contrast was recommended for further evaluation. The patient does not recall an MRI after his hospitalization in 2020.  Return to his [place] for further evaluation and consideration of MRI abdomen with and without contrast.  6.  Hepatic steatosis.  7. Morbid obesity.   8. History of pulmonary embolism over 1 year ago.  Hold Eliquis 2 days before procedure - will instruct when and how to resume after procedure. Low but real risk of cardiovascular event such as heart attack, stroke, embolism, thrombosis or ischemia/infarct of other organs off Eliquis explained and need to seek urgent help if this occurs. The patient consents to proceed. Will communicate by phone or EMR with patient's prescribing provider to confirm that holding Eliquis is reasonable in this case.    cc: Ashok Norris, MD       [facility]

## 2020-01-22 ENCOUNTER — Telehealth: Payer: Self-pay | Admitting: Gastroenterology

## 2020-01-22 ENCOUNTER — Telehealth: Payer: Self-pay

## 2020-01-22 NOTE — Telephone Encounter (Signed)
Patient called stated he was suppose to call us with the PCP info which is the following: Dr Vanice Sarah at [facility]

## 2020-01-22 NOTE — Telephone Encounter (Signed)
Faxed clearance letter to [facility] clinic Dr. Felton Clinton to respond.

## 2020-01-31 NOTE — Telephone Encounter (Signed)
Re-faxed letter to [facility] clinic Dr. Felton Clinton

## 2020-02-04 NOTE — Telephone Encounter (Signed)
Received fax from Dr. Felton Clinton stating patient can hold Eliquis up to 3 days prior to his procedure. Called patient and informed him he can hold his Eliquis 2 days prior to his procedure per our recommendations. Patient verbalized understanding.

## 2020-03-18 ENCOUNTER — Encounter: Payer: Self-pay | Admitting: Gastroenterology

## 2020-03-18 ENCOUNTER — Ambulatory Visit (AMBULATORY_SURGERY_CENTER): Payer: No Typology Code available for payment source | Admitting: Gastroenterology

## 2020-03-18 ENCOUNTER — Other Ambulatory Visit: Payer: Self-pay

## 2020-03-18 VITALS — BP 141/73 | HR 55 | Temp 96.9°F | Resp 19 | Ht 72.0 in | Wt 369.0 lb

## 2020-03-18 DIAGNOSIS — D123 Benign neoplasm of transverse colon: Secondary | ICD-10-CM

## 2020-03-18 DIAGNOSIS — D125 Benign neoplasm of sigmoid colon: Secondary | ICD-10-CM

## 2020-03-18 DIAGNOSIS — Z86010 Personal history of colonic polyps: Secondary | ICD-10-CM | POA: Diagnosis not present

## 2020-03-18 DIAGNOSIS — D124 Benign neoplasm of descending colon: Secondary | ICD-10-CM

## 2020-03-18 DIAGNOSIS — D122 Benign neoplasm of ascending colon: Secondary | ICD-10-CM

## 2020-03-18 DIAGNOSIS — D120 Benign neoplasm of cecum: Secondary | ICD-10-CM

## 2020-03-18 DIAGNOSIS — Z860101 Personal history of adenomatous and serrated colon polyps: Secondary | ICD-10-CM

## 2020-03-18 MED ORDER — SODIUM CHLORIDE 0.9 % IV SOLN: 500.0000 mL | INTRAVENOUS | Status: DC

## 2020-03-18 NOTE — Progress Notes (Signed)
Report to PACU, RN, vss, BBS= Clear.  

## 2020-03-18 NOTE — Patient Instructions (Addendum)
Handouts Provided:  Polyps, High Fiber Diet and Diverticulosis  RESUME Eliquis in 3 days at prior dose.  YOU HAD AN ENDOSCOPIC PROCEDURE TODAY AT THE [facility]:   Refer to the procedure report that was given to you for any specific questions about what was found during the examination.  If the procedure report does not answer your questions, please call your gastroenterologist to clarify.  If you requested that your care partner not be given the details of your procedure findings, then the procedure report has been included in a sealed envelope for you to review at your convenience later.  YOU SHOULD EXPECT: Some feelings of bloating in the abdomen. Passage of more gas than usual.  Walking can help get rid of the air that was put into your GI tract during the procedure and reduce the bloating. If you had a lower endoscopy (such as a colonoscopy or flexible sigmoidoscopy) you may notice spotting of blood in your stool or on the toilet paper. If you underwent a bowel prep for your procedure, you may not have a normal bowel movement for a few days.  Please Note:  You might notice some irritation and congestion in your nose or some drainage.  This is from the oxygen used during your procedure.  There is no need for concern and it should clear up in a day or so.  SYMPTOMS TO REPORT IMMEDIATELY:   Following lower endoscopy (colonoscopy or flexible sigmoidoscopy):  Excessive amounts of blood in the stool  Significant tenderness or worsening of abdominal pains  Swelling of the abdomen that is new, acute  Fever of 100F or higher  For urgent or emergent issues, a gastroenterologist can be reached at any hour by calling [phone]. Do not use MyChart messaging for urgent concerns.    DIET:  We do recommend a small meal at first, but then you may proceed to your regular diet.  Drink plenty of fluids but you should avoid alcoholic beverages for 24 hours.  ACTIVITY:  You should plan to  take it easy for the rest of today and you should NOT DRIVE or use heavy machinery until tomorrow (because of the sedation medicines used during the test).    FOLLOW UP: Our staff will call the number listed on your records 48-72 hours following your procedure to check on you and address any questions or concerns that you may have regarding the information given to you following your procedure. If we do not reach you, we will leave a message.  We will attempt to reach you two times.  During this call, we will ask if you have developed any symptoms of COVID 19. If you develop any symptoms (ie: fever, flu-like symptoms, shortness of breath, cough etc.) before then, please call [phone].  If you test positive for Covid 19 in the 2 weeks post procedure, please call and report this information to [place].    If any biopsies were taken you will be contacted by phone or by letter within the next 1-3 weeks.  Please call us at [phone] if you have not heard about the biopsies in 3 weeks.    SIGNATURES/CONFIDENTIALITY: You and/or your care partner have signed paperwork which will be entered into your electronic medical record.  These signatures attest to the fact that that the information above on your After Visit Summary has been reviewed and is understood.  Full responsibility of the confidentiality of this discharge information lies with you and/or your care-partner.

## 2020-03-18 NOTE — Progress Notes (Signed)
Called to room to assist during endoscopic procedure.  Patient ID and intended procedure confirmed with present staff. Received instructions for my participation in the procedure from the performing physician.  

## 2020-03-18 NOTE — Op Note (Addendum)
[name] Patient Name: Larry Richmond Procedure Date: 03/18/2020 9:03 AM MRN: [id number] Endoscopist: Ladene Artist , MD Age: 70 Referring MD:  Date of Birth: Oct 22, 1950 Gender: Male Account #: [id number] Procedure:                Colonoscopy Indications:              Surveillance: Personal history of adenomatous                            polyps on last colonoscopy > 3 years ago Medicines:                Monitored Anesthesia Care Procedure:                Pre-Anesthesia Assessment:                           - Prior to the procedure, a History and Physical                            was performed, and patient medications and                            allergies were reviewed. The patient's tolerance of                            previous anesthesia was also reviewed. The risks                            and benefits of the procedure and the sedation                            options and risks were discussed with the patient.                            All questions were answered, and informed consent                            was obtained. Prior Anticoagulants: The patient has                            taken Eliquis (apixaban), last dose was 2 days                            prior to procedure. ASA Grade Assessment: III - A                            patient with severe systemic disease. After                            reviewing the risks and benefits, the patient was                            deemed in satisfactory condition to undergo the  procedure.                           After obtaining informed consent, the colonoscope                            was passed under direct vision. Throughout the                            procedure, the patient's blood pressure, pulse, and                            oxygen saturations were monitored continuously. The                            Olympus CF-HQ190L (Serial# [id number]) Colonoscope was                             introduced through the anus and advanced to the the                            cecum, identified by appendiceal orifice and                            ileocecal valve. The ileocecal valve, appendiceal                            orifice, and rectum were photographed. The quality                            of the bowel preparation was good. The colonoscopy                            was somewhat difficult due to the patient's body                            habitus with substantial respiratory motion cauing                            ongoing movement in several areas of the colon. The                            patient tolerated the procedure well. Scope In: 9:21:33 AM Scope Out: 9:48:40 AM Scope Withdrawal Time: 0 hours 24 minutes 26 seconds  Total Procedure Duration: 0 hours 27 minutes 7 seconds  Findings:                 The perianal and digital rectal examinations were                            normal.                           Thirteen sessile polyps were found in the sigmoid  colon (2), descending colon (2), transverse colon                            (7), ascending colon (1) and cecum (1). The polyps                            were 6 to 10 mm in size. These polyps were removed                            with a cold snare. Resection and retrieval were                            complete.                           There was a medium-sized lipoma, 15 mm in diameter,                            in the ascending colon.                           Multiple medium-mouthed diverticula were found in                            the left colon.                           Internal hemorrhoids were found during                            retroflexion. The hemorrhoids were small and Grade                            I (internal hemorrhoids that do not prolapse).                           The exam was otherwise without abnormality on                             direct and retroflexion views. Complications:            No immediate complications. Estimated blood loss:                            None. Estimated Blood Loss:     Estimated blood loss: none. Impression:               - Thirteen 6 to 10 mm polyps in the sigmoid colon,                            in the descending colon, in the transverse colon,                            in the ascending colon and in the cecum, removed  with a cold snare. Resected and retrieved.                           - Ascending colon lipoma.                           - Diverticulosis in the left colon.                           - Internal hemorrhoids.                           - The examination was otherwise normal on direct                            and retroflexion views. Recommendation:           - Repeat colonoscopy after studies are complete for                            surveillance based on pathology results with an                            extended bowel prep.                           - Resume Eliquis (apixaban) in 3 days at prior                            dose. Refer to managing physician for further                            adjustment of therapy.                           - Patient has a contact number available for                            emergencies. The signs and symptoms of potential                            delayed complications were discussed with the                            patient. Return to normal activities tomorrow.                            Written discharge instructions were provided to the                            patient.                           - High fiber diet.                           - Continue present medications.                           -  Await pathology results.                           - No aspirin, ibuprofen, naproxen, or other                            non-steroidal anti-inflammatory drugs for 2 weeks                             after polyp removal. Meryl Dare, MD 03/18/2020 9:59:30 AM This report has been signed electronically.

## 2020-03-20 ENCOUNTER — Telehealth: Payer: Self-pay

## 2020-03-20 ENCOUNTER — Telehealth: Payer: Self-pay | Admitting: *Deleted

## 2020-03-20 NOTE — Telephone Encounter (Signed)
Attempted f/u phone call. No answer. No voicemail.

## 2020-03-20 NOTE — Telephone Encounter (Signed)
  Follow up Call-  Call back number 03/18/2020  Post procedure Call Back phone  # [phone]  Permission to leave phone message Yes  Some recent data might be hidden     2nd follow up call made.  NAULM

## 2020-03-21 ENCOUNTER — Encounter: Payer: Self-pay | Admitting: Gastroenterology

## 2020-11-04 ENCOUNTER — Emergency Department (HOSPITAL_COMMUNITY)
Admission: EM | Admit: 2020-11-04 | Discharge: 2020-11-05 | Disposition: A | Discharge status: Home / Self Care | Payer: No Typology Code available for payment source | Attending: Emergency Medicine | Admitting: Emergency Medicine

## 2020-11-04 ENCOUNTER — Emergency Department (HOSPITAL_COMMUNITY): Payer: No Typology Code available for payment source

## 2020-11-04 ENCOUNTER — Other Ambulatory Visit: Payer: Self-pay

## 2020-11-04 DIAGNOSIS — R059 Cough, unspecified: Secondary | ICD-10-CM | POA: Diagnosis not present

## 2020-11-04 DIAGNOSIS — Z20822 Contact with and (suspected) exposure to covid-19: Secondary | ICD-10-CM | POA: Insufficient documentation

## 2020-11-04 DIAGNOSIS — R0602 Shortness of breath: Secondary | ICD-10-CM | POA: Diagnosis not present

## 2020-11-04 DIAGNOSIS — Z5321 Procedure and treatment not carried out due to patient leaving prior to being seen by health care provider: Secondary | ICD-10-CM | POA: Insufficient documentation

## 2020-11-04 DIAGNOSIS — R509 Fever, unspecified: Secondary | ICD-10-CM | POA: Insufficient documentation

## 2020-11-04 DIAGNOSIS — Z8616 Personal history of COVID-19: Secondary | ICD-10-CM | POA: Diagnosis not present

## 2020-11-04 LAB — CBC WITH DIFFERENTIAL/PLATELET
Abs Immature Granulocytes: 0.02 10*3/uL (ref 0.00–0.07)
Basophils Absolute: 0 10*3/uL (ref 0.0–0.1)
Basophils Relative: 0 %
Eosinophils Absolute: 0.2 10*3/uL (ref 0.0–0.5)
Eosinophils Relative: 3 %
HCT: 44.5 % (ref 39.0–52.0)
Hemoglobin: 14 g/dL (ref 13.0–17.0)
Immature Granulocytes: 0 %
Lymphocytes Relative: 19 %
Lymphs Abs: 1.1 10*3/uL (ref 0.7–4.0)
MCH: 29.9 pg (ref 26.0–34.0)
MCHC: 31.5 g/dL (ref 30.0–36.0)
MCV: 94.9 fL (ref 80.0–100.0)
Monocytes Absolute: 0.5 10*3/uL (ref 0.1–1.0)
Monocytes Relative: 8 %
Neutro Abs: 4.3 10*3/uL (ref 1.7–7.7)
Neutrophils Relative %: 70 %
Platelets: 157 10*3/uL (ref 150–400)
RBC: 4.69 MIL/uL (ref 4.22–5.81)
RDW: 16.4 % — ABNORMAL HIGH (ref 11.5–15.5)
WBC: 6 10*3/uL (ref 4.0–10.5)
nRBC: 0 % (ref 0.0–0.2)

## 2020-11-04 LAB — COMPREHENSIVE METABOLIC PANEL
ALT: 22 U/L (ref 0–44)
AST: 21 U/L (ref 15–41)
Albumin: 3.6 g/dL (ref 3.5–5.0)
Alkaline Phosphatase: 49 U/L (ref 38–126)
Anion gap: 9 (ref 5–15)
BUN: 11 mg/dL (ref 8–23)
CO2: 19 mmol/L — ABNORMAL LOW (ref 22–32)
Calcium: 9.2 mg/dL (ref 8.9–10.3)
Chloride: 109 mmol/L (ref 98–111)
Creatinine, Ser: 1.15 mg/dL (ref 0.61–1.24)
GFR, Estimated: >60 mL/min (ref >60)
Glucose, Bld: 88 mg/dL (ref 70–99)
Potassium: 4.1 mmol/L (ref 3.5–5.1)
Sodium: 137 mmol/L (ref 135–145)
Total Bilirubin: 1.3 mg/dL — ABNORMAL HIGH (ref 0.3–1.2)
Total Protein: 7.4 g/dL (ref 6.5–8.1)

## 2020-11-04 LAB — BRAIN NATRIURETIC PEPTIDE: B Natriuretic Peptide: 26.9 pg/mL (ref 0.0–100.0)

## 2020-11-04 LAB — TROPONIN I (HIGH SENSITIVITY): Troponin I (High Sensitivity): 5 ng/L (ref <18)

## 2020-11-04 NOTE — ED Triage Notes (Signed)
Pt reports shortness of breath. Pt reports when he coughs it feels like he is about to pass out.

## 2020-11-04 NOTE — ED Provider Notes (Signed)
Emergency Medicine Provider Triage Evaluation Note  Larry Richmond , a 70 y.o. male  was evaluated in triage.  Pt complains of cough and shortness of breath worsening over the past 3 days.  He states that he coughed so much that he did have a syncopal episode.  He states that that happened yesterday.  He denies any known sick contacts.  He reports subjective fevers and chills.  He did have COVID over a year ago and was at [facility].  He did have PEs at that time however has been off of any anticoagulants for "a while."    Review of Systems  Positive: Cough, shortness of breath Negative: [place] striking head when fell, chest pain.   Physical Exam  BP (!) 160/76 (BP Location: Left Arm)   Pulse 73   Temp 97.6 F (36.4 C) (Oral)   Resp 18   Ht '6\' 2"'$  (1.88 m)   Wt (!) 149.7 kg   SpO2 97%   BMI 42.37 kg/m  Gen:   Awake, has frequent coughing spells when he gets diaphoretic and short of breath. Resp:  Normal effort until he has a coughing spell MSK:   Moves extremities without difficulty  Other:  Patient is awake and alert.  Speech is not slurred.  Lungs without obvious wheezes or rhonchi bilaterally.   Medical Decision Making  Medically screening exam initiated at 8:55 PM.  Appropriate orders placed.  Larry Richmond was informed that the remainder of the evaluation will be completed by another provider, this initial triage assessment does not replace that evaluation, and the importance of remaining in the ED until their evaluation is complete.  Note: Portions of this report may have been transcribed using voice recognition software. Every effort was made to ensure accuracy; however, inadvertent computerized transcription errors may be present    Larry Richmond 11/04/20 2057    Larry Pick, MD 11/05/20 [phone]

## 2020-11-05 LAB — RESP PANEL BY RT-PCR (FLU A&B, COVID) ARPGX2
Influenza A by PCR: NEGATIVE
Influenza B by PCR: NEGATIVE
SARS Coronavirus 2 by RT PCR: NEGATIVE

## 2020-11-05 LAB — TROPONIN I (HIGH SENSITIVITY): Troponin I (High Sensitivity): 6 ng/L (ref <18)

## 2020-11-05 NOTE — ED Notes (Signed)
Patient states his wife is on the way to pick him up to leave. Encouraged patient to stay.

## 2020-12-16 ENCOUNTER — Encounter (HOSPITAL_COMMUNITY): Payer: Self-pay | Admitting: Radiology

## 2021-05-12 ENCOUNTER — Encounter: Payer: Self-pay | Admitting: Gastroenterology

## 2021-05-12 ENCOUNTER — Ambulatory Visit (INDEPENDENT_AMBULATORY_CARE_PROVIDER_SITE_OTHER): Payer: No Typology Code available for payment source | Admitting: Gastroenterology

## 2021-05-12 VITALS — BP 138/72 | HR 53 | Ht 73.0 in | Wt 348.0 lb

## 2021-05-12 DIAGNOSIS — R1013 Epigastric pain: Secondary | ICD-10-CM | POA: Diagnosis not present

## 2021-05-12 DIAGNOSIS — R079 Chest pain, unspecified: Secondary | ICD-10-CM

## 2021-05-12 NOTE — Progress Notes (Signed)
° ° °  History of Present Illness: This is a 71 year old male who relates frequent postprandial epigastric pain.  His symptoms have not improved on pantoprazole twice daily.  He has a second symptom of fairly constant lower sternal pain.  Similar symptoms were identified in his November 2021 office visit.  He has multiple comorbidities including COPD, OSA on CPAP, morbid obesity and diabetes mellitus.  EGD performed in December 2016 showed a small hiatal hernia, esophagitis and H. pylori gastritis.  He states he was treated for H. pylori although I do not have those records.  He states he feels epigastric pain with liquids and solids.  He notes no difficulty swallowing.  He was previously treated with anticoagulants following a PE and they have been discontinued.  Current Medications, Allergies, Past Medical History, Past Surgical History, Family History and Social History were reviewed in [employer] record.   Physical Exam: General: Well developed, well nourished, no acute distress Head: Normocephalic and atraumatic Eyes: Sclerae anicteric, EOMI Ears: Normal auditory acuity Mouth: Not examined, mask on during Covid-19 pandemic Lungs: Clear throughout to auscultation Chest: Tenderness over lower sternum and xiphoid process. Heart: Regular rate and rhythm; no murmurs, rubs or bruits Abdomen: Soft, non tender and non distended. No masses, hepatosplenomegaly or hernias noted. Normal Bowel sounds Rectal: Not done Musculoskeletal: Symmetrical with no gross deformities  Pulses:  Normal pulses noted Extremities: No clubbing, cyanosis, edema or deformities noted Neurological: Alert oriented x 4, grossly nonfocal Psychological:  Alert and cooperative. Normal mood and affect   Assessment and Recommendations:  Postprandial epigastric pain not improved on pantoprazole.  Rule out ulcer, esophagitis, H. pylori.  Continue pantoprazole 40 mg twice daily for now.  Closely follow  antireflux measures.  Schedule EGD.  He is at a higher risk for procedure and anesthesia related complications due to his comorbidities.  He tolerated colonoscopy in January 2022 without difficulties.  The risks (including bleeding, perforation, infection, missed lesions, medication reactions and possible hospitalization or surgery if complications occur), benefits, and alternatives to endoscopy with possible biopsy and possible dilation were discussed with the patient and they consent to proceed.   Consider RUQ [place] if EGD is nondiagnostic. Chest wall pain - lower sternum, xiphoid process.  Further follow-up and management with his PCP. Personal history of multiple adenomatous colon polyps due for surveillance colonoscopy in January 2024. Hepatic steatosis.  Optimal control of diabetes.  Long-term carb modified, fat modified, weight loss diet supervised by his PCP.  COPD and OSA on CPAP. Morbid obesity, BMI 45.91

## 2021-05-12 NOTE — Patient Instructions (Signed)
You have been scheduled for an endoscopy. Please follow written instructions given to you at your visit today. If you use inhalers (even only as needed), please bring them with you on the day of your procedure.  The [facility] providers would like to encourage you to use MYCHART to communicate with providers for non-urgent requests or questions.  Due to long hold times on the telephone, sending your provider a message by MYCHART may be a faster and more efficient way to get a response.  Please allow 48 business hours for a response.  Please remember that this is for non-urgent requests.   Due to recent changes in healthcare laws, you may see the results of your imaging and laboratory studies on MyChart before your provider has had a chance to review them.  We understand that in some cases there may be results that are confusing or concerning to you. Not all laboratory results come back in the same time frame and the provider may be waiting for multiple results in order to interpret others.  Please give us 48 hours in order for your provider to thoroughly review all the results before contacting the office for clarification of your results.   Thank you for choosing me and [facility].  Malcolm T. Stark, Jr., MD., FACG  

## 2021-06-04 ENCOUNTER — Ambulatory Visit (AMBULATORY_SURGERY_CENTER): Payer: No Typology Code available for payment source | Admitting: Gastroenterology

## 2021-06-04 ENCOUNTER — Encounter: Payer: Self-pay | Admitting: Gastroenterology

## 2021-06-04 ENCOUNTER — Other Ambulatory Visit: Payer: Self-pay

## 2021-06-04 VITALS — BP 120/63 | HR 56 | Temp 96.9°F | Resp 12 | Ht 73.0 in | Wt 348.0 lb

## 2021-06-04 DIAGNOSIS — K449 Diaphragmatic hernia without obstruction or gangrene: Secondary | ICD-10-CM

## 2021-06-04 DIAGNOSIS — R1013 Epigastric pain: Secondary | ICD-10-CM

## 2021-06-04 DIAGNOSIS — K2100 Gastro-esophageal reflux disease with esophagitis, without bleeding: Secondary | ICD-10-CM | POA: Diagnosis not present

## 2021-06-04 MED ORDER — FAMOTIDINE 40 MG PO TABS: 40.0000 mg | ORAL_TABLET | Freq: Every day | ORAL | 1 refills | Status: DC

## 2021-06-04 MED ORDER — SODIUM CHLORIDE 0.9 % IV SOLN: 500.0000 mL | Freq: Once | INTRAVENOUS | Status: DC

## 2021-06-04 NOTE — Progress Notes (Signed)
See 05/12/2021 H&P, no changes.  ?

## 2021-06-04 NOTE — Progress Notes (Signed)
Called to room to assist during endoscopic procedure.  Patient ID and intended procedure confirmed with present staff. Received instructions for my participation in the procedure from the performing physician.  

## 2021-06-04 NOTE — Patient Instructions (Signed)
YOU HAD AN ENDOSCOPIC PROCEDURE TODAY AT THE [facility]:   Refer to the procedure report that was given to you for any specific questions about what was found during the examination.  If the procedure report does not answer your questions, please call your gastroenterologist to clarify.  If you requested that your care partner not be given the details of your procedure findings, then the procedure report has been included in a sealed envelope for you to review at your convenience later.  YOU SHOULD EXPECT: Some feelings of bloating in the abdomen. Passage of more gas than usual.  Walking can help get rid of the air that was put into your GI tract during the procedure and reduce the bloating. If you had a lower endoscopy (such as a colonoscopy or flexible sigmoidoscopy) you may notice spotting of blood in your stool or on the toilet paper. If you underwent a bowel prep for your procedure, you may not have a normal bowel movement for a few days.  Please Note:  You might notice some irritation and congestion in your nose or some drainage.  This is from the oxygen used during your procedure.  There is no need for concern and it should clear up in a day or so.  SYMPTOMS TO REPORT IMMEDIATELY:    Following upper endoscopy (EGD)  Vomiting of blood or coffee ground material  New chest pain or pain under the shoulder blades  Painful or persistently difficult swallowing  New shortness of breath  Fever of 100F or higher  Black, tarry-looking stools  For urgent or emergent issues, a gastroenterologist can be reached at any hour by calling [phone]. Do not use MyChart messaging for urgent concerns.    DIET:  We do recommend a small meal at first, but then you may proceed to your regular diet.  Drink plenty of fluids but you should avoid alcoholic beverages for 24 hours.  ACTIVITY:  You should plan to take it easy for the rest of today and you should NOT DRIVE or use heavy machinery  until tomorrow (because of the sedation medicines used during the test).    FOLLOW UP: Our staff will call the number listed on your records 48-72 hours following your procedure to check on you and address any questions or concerns that you may have regarding the information given to you following your procedure. If we do not reach you, we will leave a message.  We will attempt to reach you two times.  During this call, we will ask if you have developed any symptoms of COVID 19. If you develop any symptoms (ie: fever, flu-like symptoms, shortness of breath, cough etc.) before then, please call [phone].  If you test positive for Covid 19 in the 2 weeks post procedure, please call and report this information to us.    If any biopsies were taken you will be contacted by phone or by letter within the next 1-3 weeks.  Please call us at [phone] if you have not heard about the biopsies in 3 weeks.    SIGNATURES/CONFIDENTIALITY: You and/or your care partner have signed paperwork which will be entered into your electronic medical record.  These signatures attest to the fact that that the information above on your After Visit Summary has been reviewed and is understood.  Full responsibility of the confidentiality of this discharge information lies with you and/or your care-partner. 

## 2021-06-04 NOTE — Progress Notes (Signed)
VS completed by [place]. ? ? ?Medical history reviewed and updated. ? ?

## 2021-06-04 NOTE — Op Note (Signed)
[facility] ?Patient Name: Larry Richmond ?Procedure Date: 06/04/2021 9:36 AM ?MRN: [id number] ?Endoscopist: Ladene Artist , MD ?Age: 71 ?Referring MD:  ?Date of Birth: [birth year] ?Gender: Male ?Account #: [id number] ?Procedure:                Upper GI endoscopy ?Indications:              Epigastric abdominal pain ?Medicines:                Monitored Anesthesia Care ?Procedure:                Pre-Anesthesia Assessment: ?                          - Prior to the procedure, a History and Physical  ?                          was performed, and patient medications and  ?                          allergies were reviewed. The patient's tolerance of  ?                          previous anesthesia was also reviewed. The risks  ?                          and benefits of the procedure and the sedation  ?                          options and risks were discussed with the patient.  ?                          All questions were answered, and informed consent  ?                          was obtained. Prior Anticoagulants: The patient has  ?                          taken no previous anticoagulant or antiplatelet  ?                          agents. ASA Grade Assessment: III - A patient with  ?                          severe systemic disease. After reviewing the risks  ?                          and benefits, the patient was deemed in  ?                          satisfactory condition to undergo the procedure. ?                          After obtaining informed consent, the endoscope was  ?  passed under direct vision. Throughout the  ?                          procedure, the patient's blood pressure, pulse, and  ?                          oxygen saturations were monitored continuously. The  ?                          Endoscope was introduced through the mouth, and  ?                          advanced to the second part of duodenum. The upper  ?                          GI endoscopy was accomplished  without difficulty.  ?                          The patient tolerated the procedure well. ?Scope In: ?Scope Out: ?Findings:                 LA Grade C (one or more mucosal breaks continuous  ?                          between tops of 2 or more mucosal folds, less than  ?                          75% circumference) esophagitis with no bleeding was  ?                          found in the distal esophagus. ?                          The exam of the esophagus was otherwise normal. ?                          A medium-sized hiatal hernia was present measuring  ?                          5 cm, Hill Grade III. ?                          The exam of the stomach was otherwise normal.  ?                          Biopsies obtained with cold forceps. ?                          Patchy mildly erythematous mucosa without active  ?                          bleeding and with no stigmata of bleeding was found  ?  in the duodenal bulb. ?                          The exam of the duodenum was otherwise normal. ?Complications:            No immediate complications. ?Estimated Blood Loss:     Estimated blood loss was minimal. ?Impression:               - LA Grade C reflux esophagitis with no bleeding. ?                          - Medium-sized hiatal hernia. ?                          - Stomach otherwise appeared normal. Biopsied. ?                          - Erythematous duodenopathy. ?Recommendation:           - Patient has a contact number available for  ?                          emergencies. The signs and symptoms of potential  ?                          delayed complications were discussed with the  ?                          patient. Return to normal activities tomorrow.  ?                          Written discharge instructions were provided to the  ?                          patient. ?                          - Resume previous diet. ?                          - Follow antireflux measures. ?                           - Continue present medications including  ?                          pantoprazole 40 mg po bid, #60, 1 refill. ?                          - Famotidine 40 mg po hs #30, 1 refill. ?                          - Await pathology results. ?                          - GI follow up prn. ?                          -  Follow up with PCP at [facility] for ongoing care. ?Ladene Artist, MD ?06/04/2021 9:58:16 AM ?This report has been signed electronically. ?

## 2021-06-04 NOTE — Progress Notes (Signed)
To Pacu, VSS. Report to [email or web address] 

## 2021-06-08 ENCOUNTER — Telehealth: Payer: Self-pay | Admitting: *Deleted

## 2021-06-08 NOTE — Telephone Encounter (Signed)
No answer on first attempt follow up call. Left message.  ?

## 2021-06-08 NOTE — Telephone Encounter (Signed)
No answer on second attempt follow up call.  ? ?

## 2021-06-15 ENCOUNTER — Encounter: Payer: Self-pay | Admitting: Gastroenterology

## 2021-07-02 IMAGING — CT CT ABD-PELV W/ CM
2 of 5 series · 16 of 46 positions shown, 18 images · IV contrast (APPLIED)
Comparison: CT scan 02/18/2016

CLINICAL DATA: Acute abdominal pain

EXAM:
CT ABDOMEN AND PELVIS WITH CONTRAST
TECHNIQUE: Multidetector CT imaging of the abdomen and pelvis was performed
using the standard protocol following bolus administration of
intravenous contrast.
CONTRAST:  100mL OMNIPAQUE IOHEXOL 300 MG/ML  SOLN

[Series 2: axial st · axial · 0.98mm/px · z∈[-497,-77]mm · 13 of 98 slices shown, 15 images]
[im 7/98  soft-tissue]
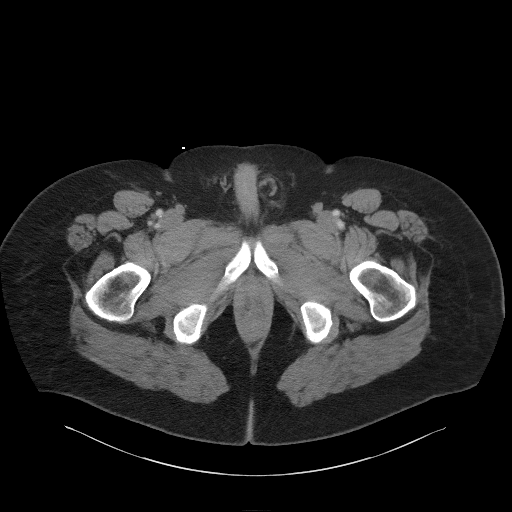
[im 7/98  bone]
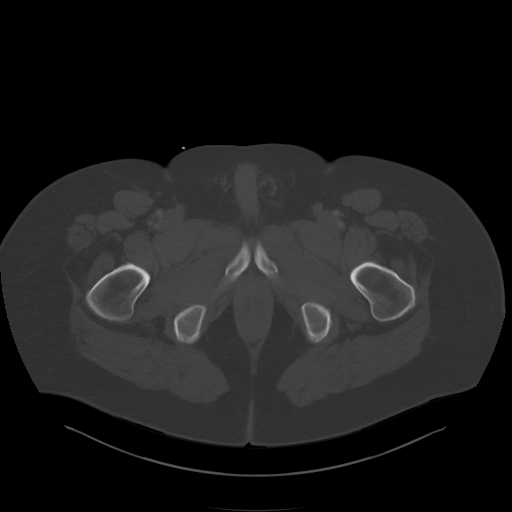
[im 13/98  soft-tissue]
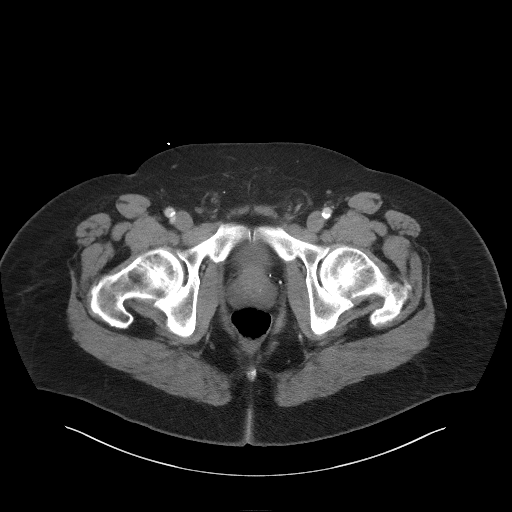
[im 20/98  soft-tissue]
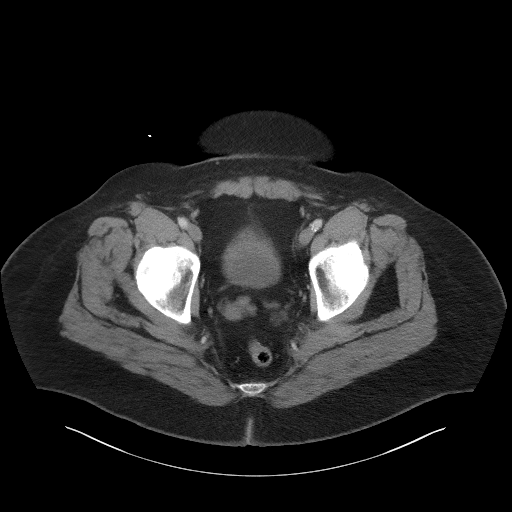
[im 26/98  soft-tissue]
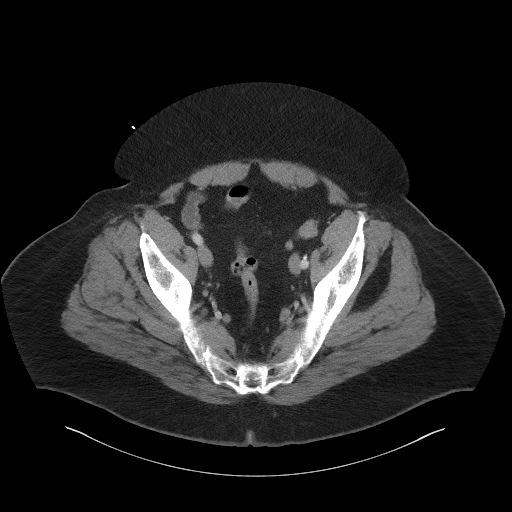
[im 33/98  soft-tissue]
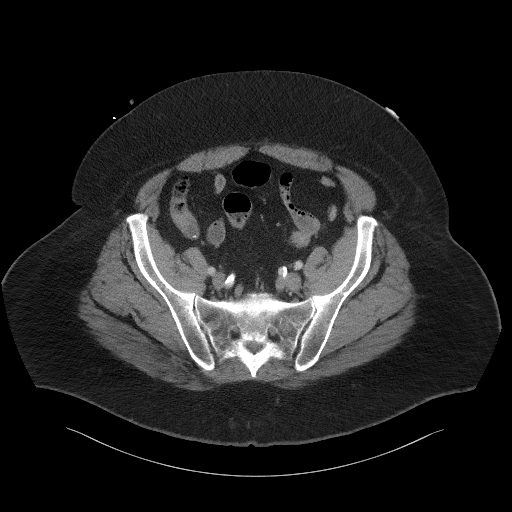
[im 39/98  soft-tissue]
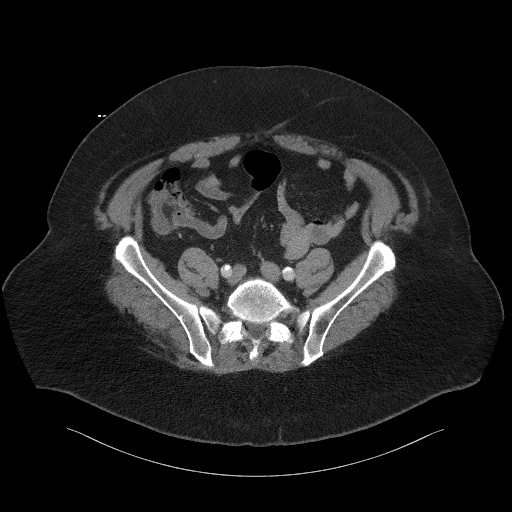
[im 52/98  soft-tissue]
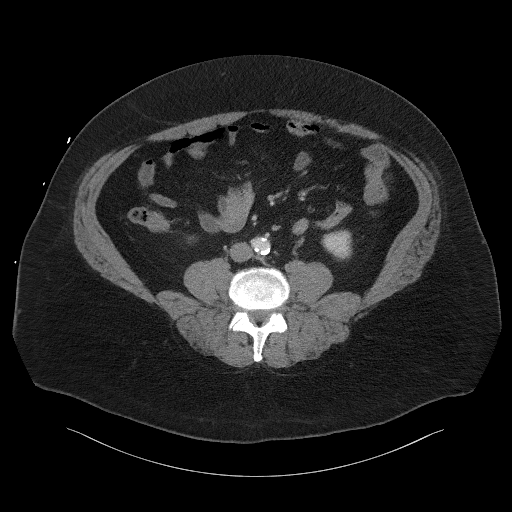
[im 59/98  soft-tissue]
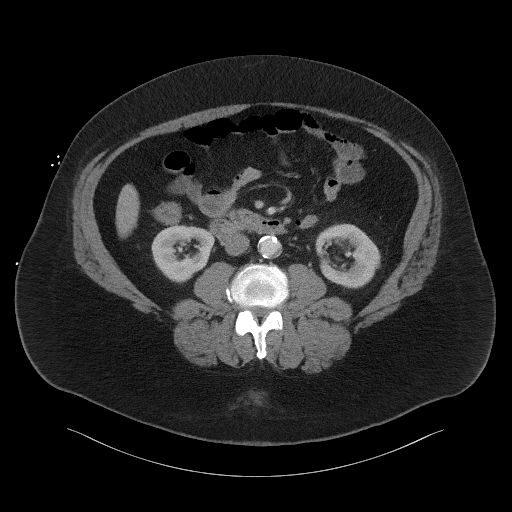
[im 65/98  soft-tissue]
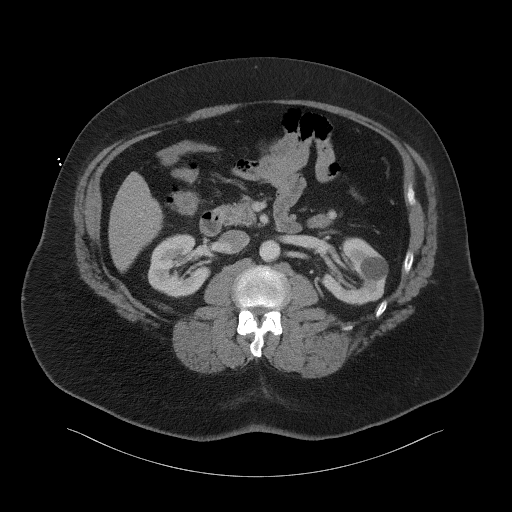
[im 65/98  bone]
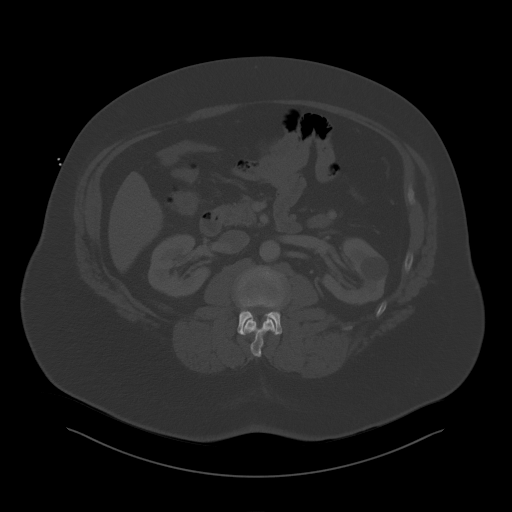
[im 72/98  soft-tissue]
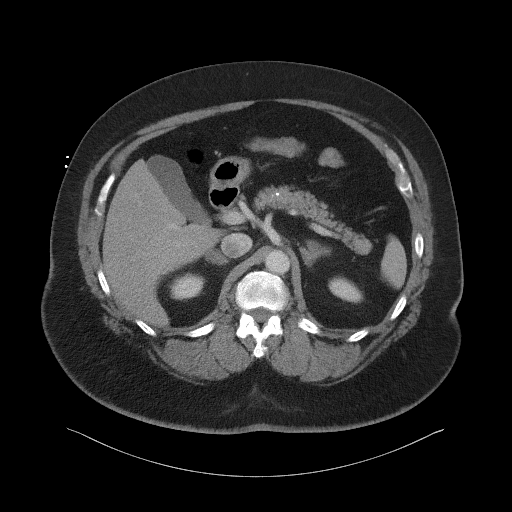
[im 78/98  soft-tissue]
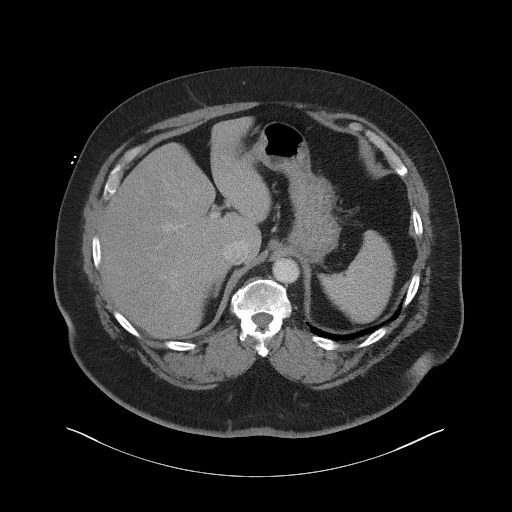
[im 85/98  soft-tissue]
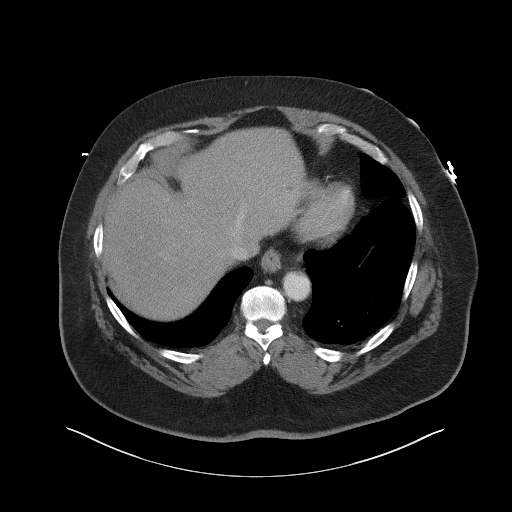
[im 91/98  soft-tissue]
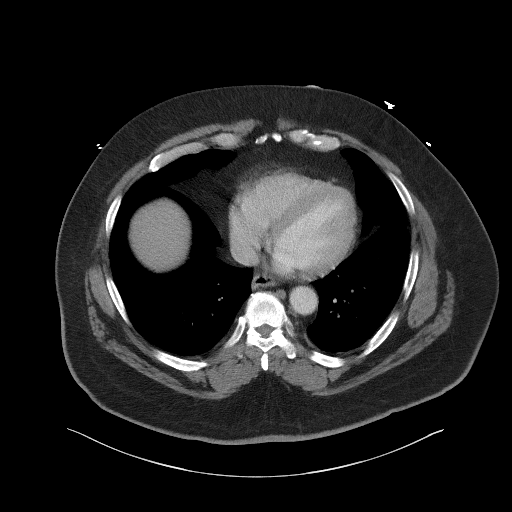

[Series 4: coronal st · coronal · 0.88mm/px · 3 of 114 slices shown]
[im 38/114  soft-tissue]
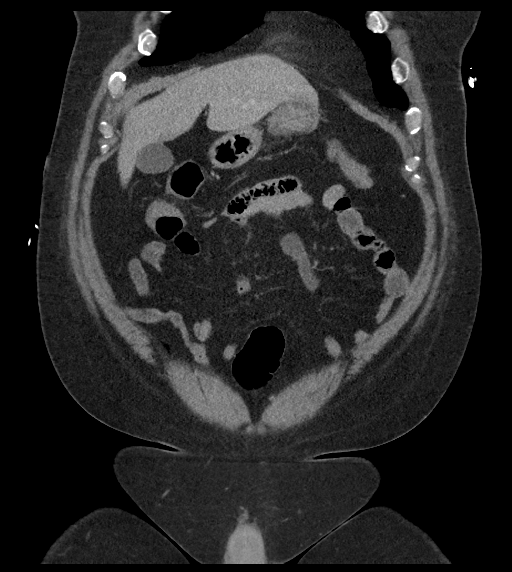
[im 51/114  soft-tissue]
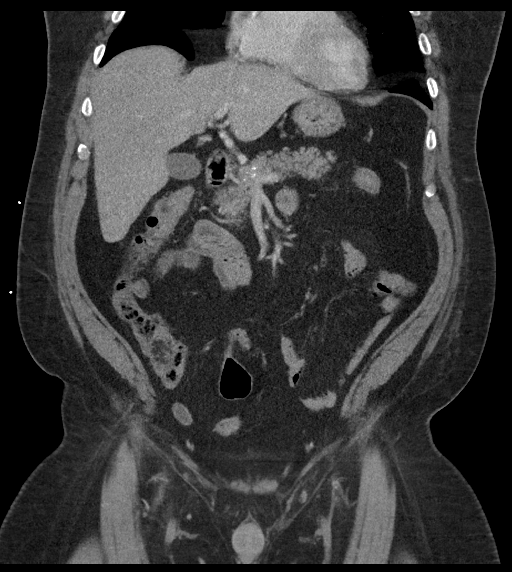
[im 63/114  soft-tissue]
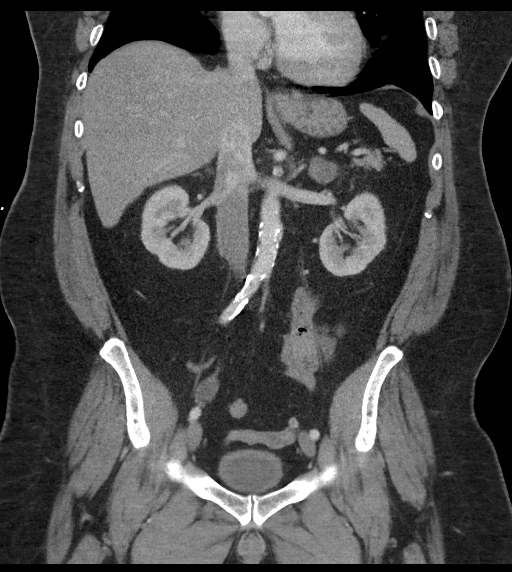

[16 of 46 positions shown; findings below may reference images not displayed]

FINDINGS: Lower chest: The heart is normal in size. No pericardial effusion.
Coronary artery calcifications are noted. There are patchy basilar
scarring changes but no acute pulmonary findings or worrisome
pulmonary lesions.

Hepatobiliary: A few small low-attenuation liver lesions appears
stable and likely benign cyst. There is also a vague lesion in
segment 7 on image number 17 measuring approximately 2 cm. It is
difficult to see on the prior CT scan. It could be a hemangioma.
There is also mild diffuse fatty infiltration of the liver and this
could be an area of more focal fat.

No intra or extrahepatic biliary dilatation. The gallbladder appears
normal.

Pancreas: No mass, inflammation or ductal dilatation. Scattered
parenchymal calcifications may suggest prior pancreatitis.

Spleen: Normal size.  No focal lesions.

Adrenals/Urinary Tract: Stable bilateral adrenal gland nodules most
consistent with benign adenomas.

Simple appearing left renal cyst. No worrisome renal lesions or
hydroureteronephrosis.

The bladder is unremarkable.

Stomach/Bowel: The stomach, duodenum, small bowel and colon are
unremarkable. No acute inflammatory changes, mass lesions or
obstructive findings. The terminal ileum and appendix are normal. No
significant diverticulosis or findings for acute diverticulitis.

Vascular/Lymphatic: Age advanced atherosclerotic calcifications
involving the aorta and iliac arteries. No aneurysm or focal
dissection. The branch vessels are patent. The major venous
structures are patent.

No mesenteric or retroperitoneal mass or adenopathy.

Reproductive: The prostate gland and seminal vesicles are
unremarkable.

Other: No pelvic mass or adenopathy. No free pelvic fluid
collections. No inguinal mass or adenopathy. No abdominal wall
hernia or subcutaneous lesions.

Musculoskeletal: No significant bony findings. Stable benign lipoma
between the gluteus minimus and medius muscles on the left.
IMPRESSION: 1. No acute abdominal/pelvic findings.
2. Small scattered low-attenuation liver lesions, likely benign
cysts. There is an indeterminate 2 cm lesion in the right hepatic
lobe. Recommend MRI abdomen without and with contrast for further
evaluation.
3. Stable benign-appearing bilateral adrenal gland adenomas.
4. Mild diffuse fatty infiltration of the liver.
5. Advanced vascular calcifications but no aneurysm or dissection or
occlusion.

## 2021-07-12 IMAGING — DX DG CHEST 1V PORT
2 series · 2 of 2 positions shown · non-contrast
Comparison: January 05, 2019

CLINICAL DATA: Shortness of breath.  COVID positive.

EXAM:
PORTABLE CHEST 1 VIEW

[chest ap (1 of 2)]
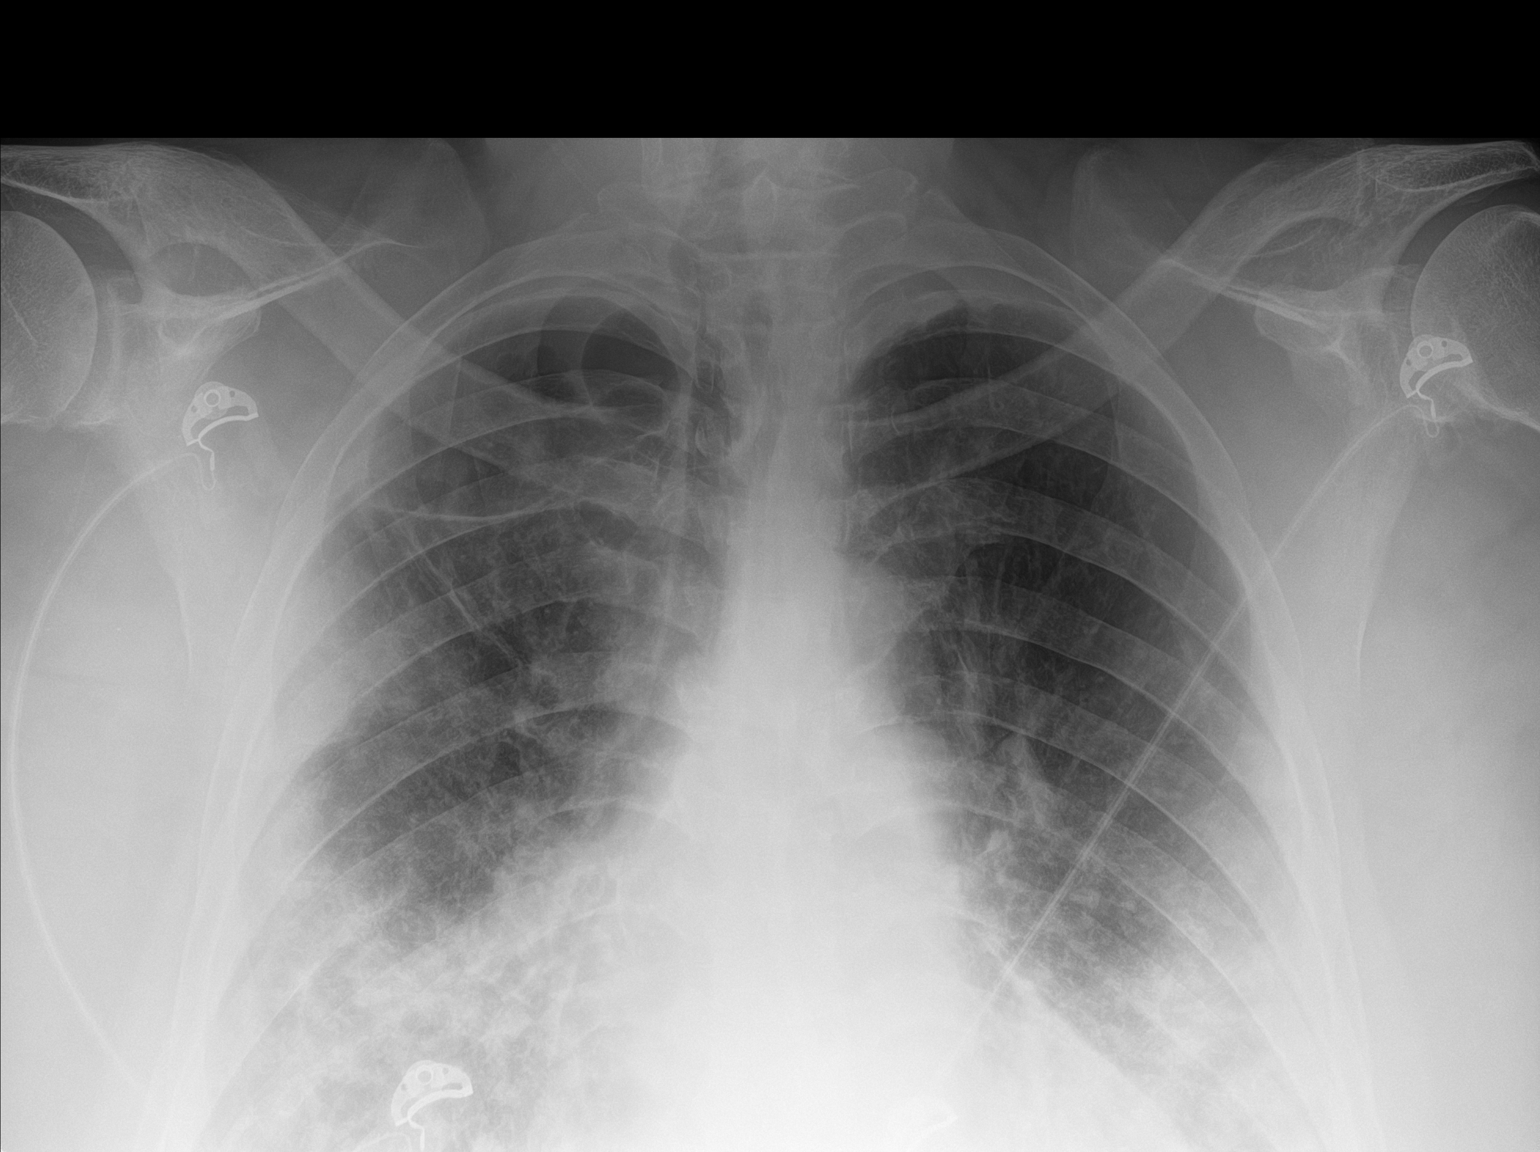

[chest ap (2 of 2)]
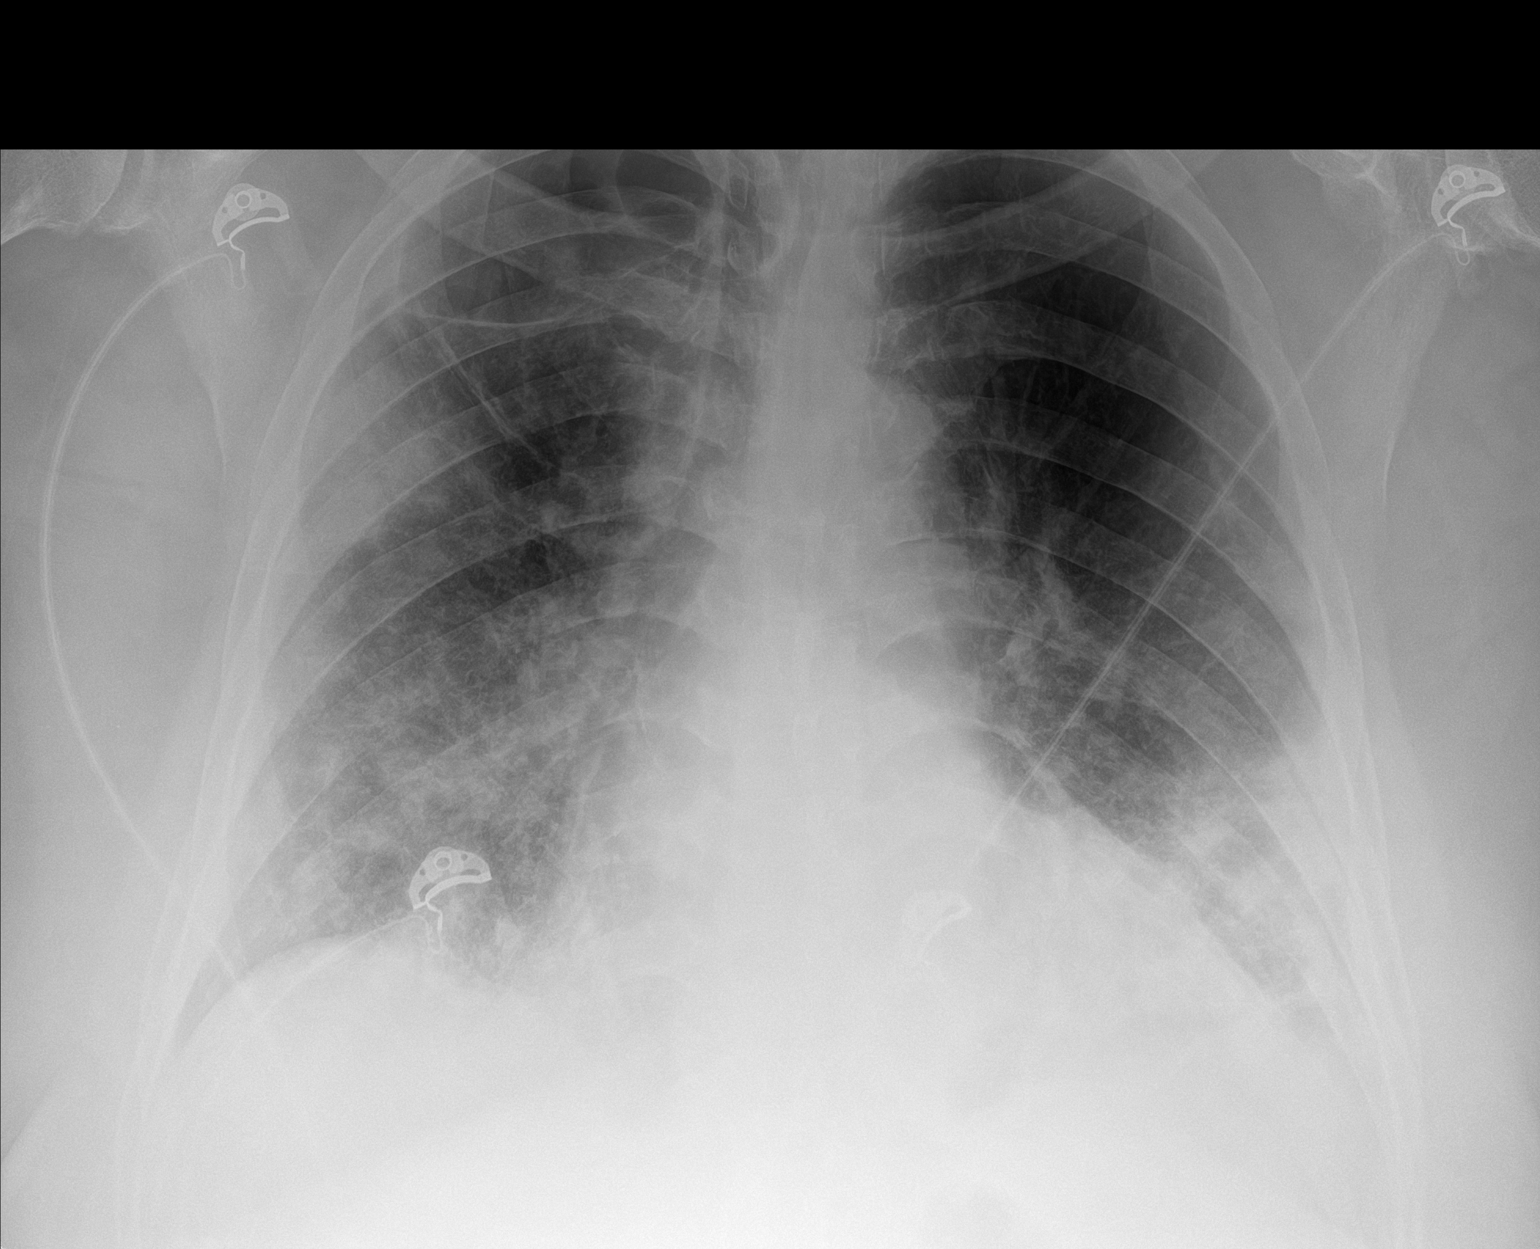

[2 of 2 positions shown; findings below may reference images not displayed]

FINDINGS: There is airspace consolidation throughout both lower lobe regions
which appear similar on the right and slightly increased on the left
compared to 1 week prior. There is new airspace opacity in the right
upper lobe. There is stable scarring in the right upper lobe with a
large focal bulla in the right upper lobe toward the apex. Left
upper lobe is clear. There is mild cardiomegaly with pulmonary
vascularity normal. No adenopathy. No bone lesions.
IMPRESSION: Multifocal airspace opacity consistent with widespread pneumonia.
Increase in opacity in the left lower lung region and new increased
opacity in the right upper lobe. Stable opacity right lower lung
region.

Stable scarring and prominent bulla in the right upper lobe.

Stable cardiac prominence.  No evident adenopathy.

## 2022-03-31 ENCOUNTER — Encounter: Payer: Self-pay | Admitting: Gastroenterology

## 2022-06-30 ENCOUNTER — Other Ambulatory Visit (HOSPITAL_COMMUNITY): Payer: Self-pay | Admitting: Chiropractic Medicine

## 2022-06-30 DIAGNOSIS — M25562 Pain in left knee: Secondary | ICD-10-CM

## 2022-06-30 DIAGNOSIS — M5416 Radiculopathy, lumbar region: Secondary | ICD-10-CM

## 2022-06-30 DIAGNOSIS — M545 Low back pain, unspecified: Secondary | ICD-10-CM

## 2022-07-21 ENCOUNTER — Ambulatory Visit (HOSPITAL_COMMUNITY)
Admission: RE | Admit: 2022-07-21 | Discharge: 2022-07-21 | Disposition: A | Discharge status: Home / Self Care | Payer: No Typology Code available for payment source | Source: Ambulatory Visit | Attending: Chiropractic Medicine | Admitting: Chiropractic Medicine

## 2022-07-21 DIAGNOSIS — M545 Low back pain, unspecified: Secondary | ICD-10-CM | POA: Insufficient documentation

## 2022-07-21 DIAGNOSIS — M25562 Pain in left knee: Secondary | ICD-10-CM | POA: Insufficient documentation

## 2022-07-21 DIAGNOSIS — M5416 Radiculopathy, lumbar region: Secondary | ICD-10-CM | POA: Diagnosis present

## 2023-01-01 ENCOUNTER — Emergency Department (HOSPITAL_COMMUNITY)
Admission: EM | Admit: 2023-01-01 | Discharge: 2023-01-01 | Disposition: A | Discharge status: Home / Self Care | Payer: No Typology Code available for payment source | Attending: Emergency Medicine | Admitting: Emergency Medicine

## 2023-01-01 ENCOUNTER — Encounter (HOSPITAL_COMMUNITY): Payer: Self-pay

## 2023-01-01 ENCOUNTER — Emergency Department (HOSPITAL_COMMUNITY): Payer: No Typology Code available for payment source

## 2023-01-01 ENCOUNTER — Other Ambulatory Visit: Payer: Self-pay

## 2023-01-01 DIAGNOSIS — R079 Chest pain, unspecified: Secondary | ICD-10-CM | POA: Diagnosis present

## 2023-01-01 DIAGNOSIS — R0682 Tachypnea, not elsewhere classified: Secondary | ICD-10-CM | POA: Diagnosis not present

## 2023-01-01 DIAGNOSIS — Z7984 Long term (current) use of oral hypoglycemic drugs: Secondary | ICD-10-CM | POA: Diagnosis not present

## 2023-01-01 DIAGNOSIS — Z79899 Other long term (current) drug therapy: Secondary | ICD-10-CM | POA: Diagnosis not present

## 2023-01-01 DIAGNOSIS — R600 Localized edema: Secondary | ICD-10-CM | POA: Diagnosis not present

## 2023-01-01 DIAGNOSIS — I10 Essential (primary) hypertension: Secondary | ICD-10-CM | POA: Diagnosis not present

## 2023-01-01 DIAGNOSIS — E119 Type 2 diabetes mellitus without complications: Secondary | ICD-10-CM | POA: Diagnosis not present

## 2023-01-01 LAB — CBC WITH DIFFERENTIAL/PLATELET
Abs Immature Granulocytes: 0.02 10*3/uL (ref 0.00–0.07)
Basophils Absolute: 0 10*3/uL (ref 0.0–0.1)
Basophils Relative: 0 %
Eosinophils Absolute: 0.1 10*3/uL (ref 0.0–0.5)
Eosinophils Relative: 1 %
HCT: 47.3 % (ref 39.0–52.0)
Hemoglobin: 14.7 g/dL (ref 13.0–17.0)
Immature Granulocytes: 0 %
Lymphocytes Relative: 13 %
Lymphs Abs: 1.1 10*3/uL (ref 0.7–4.0)
MCH: 28.9 pg (ref 26.0–34.0)
MCHC: 31.1 g/dL (ref 30.0–36.0)
MCV: 93.1 fL (ref 80.0–100.0)
Monocytes Absolute: 0.3 10*3/uL (ref 0.1–1.0)
Monocytes Relative: 4 %
Neutro Abs: 6.9 10*3/uL (ref 1.7–7.7)
Neutrophils Relative %: 82 %
Platelets: 163 10*3/uL (ref 150–400)
RBC: 5.08 MIL/uL (ref 4.22–5.81)
RDW: 16.3 % — ABNORMAL HIGH (ref 11.5–15.5)
WBC: 8.4 10*3/uL (ref 4.0–10.5)
nRBC: 0 % (ref 0.0–0.2)

## 2023-01-01 LAB — BASIC METABOLIC PANEL
Anion gap: 12 (ref 5–15)
BUN: 17 mg/dL (ref 8–23)
CO2: 22 mmol/L (ref 22–32)
Calcium: 9.2 mg/dL (ref 8.9–10.3)
Chloride: 105 mmol/L (ref 98–111)
Creatinine, Ser: 1.38 mg/dL — ABNORMAL HIGH (ref 0.61–1.24)
GFR, Estimated: 54 mL/min — ABNORMAL LOW (ref >60)
Glucose, Bld: 89 mg/dL (ref 70–99)
Potassium: 4.6 mmol/L (ref 3.5–5.1)
Sodium: 139 mmol/L (ref 135–145)

## 2023-01-01 LAB — TROPONIN I (HIGH SENSITIVITY)
Troponin I (High Sensitivity): 7 ng/L (ref <18)
Troponin I (High Sensitivity): 8 ng/L (ref <18)

## 2023-01-01 LAB — D-DIMER, QUANTITATIVE: D-Dimer, Quant: 0.52 ug{FEU}/mL — ABNORMAL HIGH (ref 0.00–0.50)

## 2023-01-01 LAB — BRAIN NATRIURETIC PEPTIDE: B Natriuretic Peptide: 29.4 pg/mL (ref 0.0–100.0)

## 2023-01-01 MED ORDER — MORPHINE SULFATE (PF) 4 MG/ML IV SOLN
4.0000 mg | Freq: Once | INTRAVENOUS | Status: AC
  Administered 2023-01-01: 4 mg via INTRAVENOUS
  Filled 2023-01-01: qty 1

## 2023-01-01 MED ORDER — DIPHENHYDRAMINE HCL 50 MG/ML IJ SOLN
25.0000 mg | Freq: Once | INTRAMUSCULAR | Status: AC
  Administered 2023-01-01: 25 mg via INTRAVENOUS
  Filled 2023-01-01: qty 1

## 2023-01-01 NOTE — ED Notes (Signed)
Please update family [phone]

## 2023-01-01 NOTE — Discharge Instructions (Addendum)
You were seen in the emergency department for your chest pain.  Your workup showed no signs of heart attack or stress on your heart.  It is unclear what is causing your pain at this time but you do have some risk factors for heart disease and we have given you a referral for cardiology to follow-up with.  You can take Tylenol as needed for pain.  You should return to the emergency department for significantly worsening pain, severe shortness of breath, if you pass out or any other new or concerning symptoms.

## 2023-01-01 NOTE — ED Triage Notes (Signed)
Pt bib ems from home c/o c/p that woke pt out sleep. Pt BP 280 palpated. Pt denies cardiac hx  Pt received 2 nitroglycerin and 324 mg aspirin en route. Pt states it didn't improve pain.    BP 166/90 HR 56

## 2023-01-01 NOTE — ED Provider Notes (Signed)
[place] EMERGENCY DEPARTMENT AT [facility] Provider Note   CSN: [id number] Arrival date & time: 01/01/23  4132     History  Chief Complaint  Patient presents with   Chest Pain    Larry Richmond is a 72 y.o. male.  Patient is a 72 year old male with a past medical history of hyperthyroidism, hypertension, diabetes, prior PE provoked by COVID no longer on [facility] presenting to the emergency department with chest pain.  The patient states that he woke from his sleep around 6-6 30 this morning with chest pain.  He states it feels like a midsternal pressure type of pain.  He reports associated diaphoresis and mild shortness of breath.  He denies any nausea or vomiting.  He denies any recent fever or cough or lower extremity swelling.  He denies any known cardiac history.  He denies any recent hospitalization or surgery, recent long travel in the car or plane, hormone use or cancer history.  EMS did give two sublingual nitro and 324 mg of chewable aspirin and route without any improvement of symptoms.  The history is provided by the patient.       Home Medications Prior to Admission medications   Medication Sig Start Date End Date Taking? Authorizing Provider  albuterol (VENTOLIN HFA) 108 (90 Base) MCG/ACT inhaler Inhale 2 puffs into the lungs in the morning, at noon, in the evening, and at bedtime.    [provider]  atorvastatin (LIPITOR) 40 MG tablet Take 40 mg by mouth daily.    [provider]  dexamethasone (DECADRON) 6 MG tablet Take 1 tablet (6 mg total) by mouth daily. 01/11/19   Leatha Gilding, MD  diphenoxylate-atropine (LOMOTIL) 2.5-0.025 MG tablet Take 1 tablet by mouth 4 (four) times daily as needed for diarrhea or loose stools.    [provider]  famotidine (PEPCID) 40 MG tablet Take 1 tablet (40 mg total) by mouth daily. 06/04/21   Meryl Dare, MD  levothyroxine (SYNTHROID) 100 MCG tablet Take 100 mcg by mouth daily before  breakfast.    [provider]  lisinopril (ZESTRIL) 5 MG tablet Take 1 tablet (5 mg total) by mouth daily. 01/20/19   Marinda Elk, MD  metFORMIN (GLUCOPHAGE) 500 MG tablet Take 500 mg by mouth daily with breakfast.    [provider]  pantoprazole (PROTONIX) 40 MG tablet Take 40 mg by mouth 2 (two) times daily before a meal.    [provider]      Allergies    Patient has no known allergies.    Review of Systems   Review of Systems  Physical Exam Updated Vital Signs BP (!) 155/91   Pulse 71   Temp 98.1 F (36.7 C) (Oral)   Resp (!) 29   Ht 6\' 2"  (1.88 m)   Wt (!) 158.8 kg   SpO2 100%   BMI 44.94 kg/m  Physical Exam Vitals and nursing note reviewed.  Constitutional:      General: He is not in acute distress.    Appearance: Normal appearance. He is obese.  HENT:     Head: Normocephalic and atraumatic.     Nose: Nose normal.     Mouth/Throat:     Mouth: Mucous membranes are moist.     Pharynx: Oropharynx is clear.  Eyes:     Extraocular Movements: Extraocular movements intact.     Conjunctiva/sclera: Conjunctivae normal.  Cardiovascular:     Rate and Rhythm: Normal rate and  regular rhythm.     Heart sounds: Normal heart sounds.  Pulmonary:     Breath sounds: Normal breath sounds.     Comments: Mildly tachypneic and conversationally dyspneic but speaking in full sentences, no retractions Abdominal:     General: Abdomen is flat.     Palpations: Abdomen is soft.     Tenderness: There is no abdominal tenderness.  Musculoskeletal:        General: Normal range of motion.     Cervical back: Normal range of motion.     Right lower leg: Edema (trace) present.     Left lower leg: Edema (trace) present.  Skin:    General: Skin is warm and dry.  Neurological:     General: No focal deficit present.     Mental Status: He is alert and oriented to person, place, and time.  Psychiatric:        Mood and Affect: Mood normal.        Behavior:  Behavior normal.     ED Results / Procedures / Treatments   Labs (all labs ordered are listed, but only abnormal results are displayed) Labs Reviewed  CBC WITH DIFFERENTIAL/PLATELET - Abnormal; Notable for the following components:      Result Value   RDW 16.3 (*)    All other components within normal limits  BASIC METABOLIC PANEL - Abnormal; Notable for the following components:   Creatinine, Ser 1.38 (*)    GFR, Estimated 54 (*)    All other components within normal limits  D-DIMER, QUANTITATIVE (NOT AT [facility]) - Abnormal; Notable for the following components:   D-Dimer, Quant 0.52 (*)    All other components within normal limits  BRAIN NATRIURETIC PEPTIDE  TROPONIN I (HIGH SENSITIVITY)  TROPONIN I (HIGH SENSITIVITY)    EKG EKG Interpretation Date/Time:  Saturday January 01 2023 12:13:46 EDT Ventricular Rate:  61 PR Interval:  201 QRS Duration:  82 QT Interval:  385 QTC Calculation: 388 R Axis:   51  Text Interpretation: Sinus rhythm Atrial premature complexes Since last tracing of earlier today No significant change was found Confirmed by Elayne Snare ([id number]) on 01/01/2023 12:24:39 PM  Radiology DG Chest 2 View  Result Date: 01/01/2023 CLINICAL DATA:  Chest pain. EXAM: CHEST - 2 VIEW COMPARISON:  11/04/2020. FINDINGS: Low lung volume. There are probable atelectatic changes at the left lung base. Correlate clinically for superimposed pneumonia. Bilateral lung fields are otherwise clear. Bilateral costophrenic angles are clear. Stable cardio-mediastinal silhouette, considering the AP technique and low lung volume. No acute osseous abnormalities. The soft tissues are within normal limits. IMPRESSION: 1. Low lung volume. Left basilar atelectasis. Correlate clinically for superimposed pneumonia. Electronically Signed   By: Jules Schick M.D.   On: 01/01/2023 09:05    Procedures Procedures    Medications Ordered in ED Medications  morphine (PF) 4 MG/ML injection 4 mg  (4 mg Intravenous Given 01/01/23 0934)  diphenhydrAMINE (BENADRYL) injection 25 mg (25 mg Intravenous Given 01/01/23 0956)    ED Course/ Medical Decision Making/ A&P Clinical Course as of 01/01/23 1353  Sat Jan 01, 2023  6578 Patient complained of some itching with the morphine. Will be given benadryl. [VK]  1033 Mild increased Cr from baseline, initial troponin negative. Repeat trop and d-dimer pending. [VK]  1119 Age-adjusted d-dimer negative making PE unlikely. [VK]  1141 Chest pain has improved. BNP pending, machine was down causing delay. Possible infiltrate on CXR but no fever or cough or other signs of  pneumonia making this unlikely. [VK]  1258 BNP normal. Patient is stable for discharge home, will be given cardiology follow up with significant cardiac risk factors. [VK]    Clinical Course User Index [VK] Rexford Maus, DO                                 Medical Decision Making This patient presents to the ED with chief complaint(s) of chest pain with pertinent past medical history of hyperthyroid, HTN, DM, COPD, prior PE which further complicates the presenting complaint. The complaint involves an extensive differential diagnosis and also carries with it a high risk of complications and morbidity.    The differential diagnosis includes ACS, arrhythmia, anemia, pneumonia, pneumothorax, pulmonary edema, pleural effusion, considering PE he is low risk by Wells criteria, gastritis, GERD  Additional history obtained: Additional history obtained from EMS  Records reviewed Care Everywhere/External Records  ED Course and Reassessment: On patient's arrival to the emergency department he is hemodynamically stable in no acute distress, mildly uncomfortable appearing and mildly tachypneic.  I reviewed EKG by EMS that showed no acute ischemic changes, will have repeat EKG here.  Will have labs including troponin and D-dimer, low risk for prior history of PE but no other signs of PE on  exam.  Will be given morphine for pain and will be closely reassessed.  Independent labs interpretation:  The following labs were independently interpreted: Within normal range  Independent visualization of imaging: - I independently visualized the following imaging with scope of interpretation limited to determining acute life threatening conditions related to emergency care: Chest x-ray, which revealed atelectasis otherwise no acute disease  Consultation: - Consulted or discussed management/test interpretation w/ external professional: N/A  Consideration for admission or further workup: Patient has no emergent conditions requiring admission or further work-up at this time and is stable for discharge home with primary care and cardiology follow-up  Social Determinants of health: N/A    Amount and/or Complexity of Data Reviewed Labs: ordered. Radiology: ordered.  Risk Prescription drug management.          Final Clinical Impression(s) / ED Diagnoses Final diagnoses:  Nonspecific chest pain    Rx / DC Orders ED Discharge Orders          Ordered    Ambulatory referral to Cardiology       Comments: If you have not heard from the Cardiology office within the next 72 hours please call [phone].   01/01/23 1259              Rexford Maus, DO 01/01/23 1353

## 2023-02-28 ENCOUNTER — Encounter: Payer: Self-pay | Admitting: Cardiovascular Disease

## 2023-02-28 ENCOUNTER — Ambulatory Visit
Payer: No Typology Code available for payment source | Attending: Cardiovascular Disease | Admitting: Cardiovascular Disease

## 2023-02-28 VITALS — BP 124/68 | HR 68 | Ht 74.0 in | Wt 360.0 lb

## 2023-02-28 DIAGNOSIS — R0789 Other chest pain: Secondary | ICD-10-CM

## 2023-02-28 DIAGNOSIS — F17200 Nicotine dependence, unspecified, uncomplicated: Secondary | ICD-10-CM

## 2023-02-28 DIAGNOSIS — G4733 Obstructive sleep apnea (adult) (pediatric): Secondary | ICD-10-CM

## 2023-02-28 DIAGNOSIS — I10 Essential (primary) hypertension: Secondary | ICD-10-CM

## 2023-02-28 DIAGNOSIS — E782 Mixed hyperlipidemia: Secondary | ICD-10-CM

## 2023-02-28 DIAGNOSIS — I2699 Other pulmonary embolism without acute cor pulmonale: Secondary | ICD-10-CM

## 2023-02-28 LAB — HEPATIC FUNCTION PANEL
ALT: 17 [IU]/L (ref 0–44)
AST: 17 [IU]/L (ref 0–40)
Albumin: 3.8 g/dL (ref 3.8–4.8)
Alkaline Phosphatase: 55 [IU]/L (ref 44–121)
Bilirubin Total: 0.7 mg/dL (ref 0.0–1.2)
Bilirubin, Direct: 0.23 mg/dL (ref 0.00–0.40)
Total Protein: 7 g/dL (ref 6.0–8.5)

## 2023-02-28 LAB — LIPID PANEL
Chol/HDL Ratio: 5.2 {ratio} — ABNORMAL HIGH (ref 0.0–5.0)
Cholesterol, Total: 236 mg/dL — ABNORMAL HIGH (ref 100–199)
HDL: 45 mg/dL (ref >39)
LDL Chol Calc (NIH): 178 mg/dL — ABNORMAL HIGH (ref 0–99)
Triglycerides: 73 mg/dL (ref 0–149)
VLDL Cholesterol Cal: 13 mg/dL (ref 5–40)

## 2023-02-28 NOTE — Assessment & Plan Note (Signed)
BMI 46 ?

## 2023-02-28 NOTE — Assessment & Plan Note (Signed)
Patient was seen in the ER 01/01/2023 with atypical chest pain with a negative workup.  Does have positive risk factors.  He said no subsequent symptoms.  If CTA performed 11//20 did reveal coronary calcification in the left main, LAD and RCA.  I am going to get a coronary calcium score to risk stratify.

## 2023-02-28 NOTE — Assessment & Plan Note (Signed)
History of hyperlipidemia on statin therapy.  We will recheck a lipid liver profile today. 

## 2023-02-28 NOTE — Patient Instructions (Addendum)
Medication Instructions:  Your physician recommends that you continue on your current medications as directed. Please refer to the Current Medication list given to you today.  *If you need a refill on your cardiac medications before your next appointment, please call your pharmacy*   Lab Work: Your physician recommends that you have labs drawn today: Lipid/liver panel  If you have labs (blood work) drawn today and your tests are completely normal, you will receive your results only by: MyChart Message (if you have MyChart) OR A paper copy in the mail If you have any lab test that is abnormal or we need to change your treatment, we will call you to review the results.   Testing/Procedures: Dr. Allyson Sabal has ordered a CT coronary calcium score.   Test locations:  [facility]  [facility]  This is $99 out of pocket.   Coronary CalciumScan A coronary calcium scan is an imaging test used to look for deposits of calcium and other fatty materials (plaques) in the inner lining of the blood vessels of the heart (coronary arteries). These deposits of calcium and plaques can partly clog and narrow the coronary arteries without producing any symptoms or warning signs. This puts a person at risk for a heart attack. This test can detect these deposits before symptoms develop. Tell a health care provider about: Any allergies you have. All medicines you are taking, including vitamins, herbs, eye drops, creams, and over-the-counter medicines. Any problems you or family members have had with anesthetic medicines. Any blood disorders you have. Any surgeries you have had. Any medical conditions you have. Whether you are pregnant or may be pregnant. What are the risks? Generally, this is a safe procedure. However, problems may occur, including: Harm to a pregnant woman and her unborn baby. This test involves the  use of radiation. Radiation exposure can be dangerous to a pregnant woman and her unborn baby. If you are pregnant, you generally should not have this procedure done. Slight increase in the risk of cancer. This is because of the radiation involved in the test. What happens before the procedure? No preparation is needed for this procedure. What happens during the procedure? You will undress and remove any jewelry around your neck or chest. You will put on a hospital gown. Sticky electrodes will be placed on your chest. The electrodes will be connected to an electrocardiogram (ECG) machine to record a tracing of the electrical activity of your heart. A CT scanner will take pictures of your heart. During this time, you will be asked to lie still and hold your breath for 2-3 seconds while a picture of your heart is being taken. The procedure may vary among health care providers and hospitals. What happens after the procedure? You can get dressed. You can return to your normal activities. It is up to you to get the results of your test. Ask your health care provider, or the department that is doing the test, when your results will be ready. Summary A coronary calcium scan is an imaging test used to look for deposits of calcium and other fatty materials (plaques) in the inner lining of the blood vessels of the heart (coronary arteries). Generally, this is a safe procedure. Tell your health care provider if you are pregnant or may be pregnant. No preparation is needed for this procedure. A CT scanner will take pictures of your heart. You can return to your normal  activities after the scan is done. This information is not intended to replace advice given to you by your health care provider. Make sure you discuss any questions you have with your health care provider. Document Released: 08/28/2007 Document Revised: 01/19/2016 Document Reviewed: 01/19/2016 Elsevier Interactive Patient Education  2017  [employer].    Follow-Up: At [facility], you and your health needs are our priority.  As part of our continuing mission to provide you with exceptional heart care, we have created designated Provider Care Teams.  These Care Teams include your primary Cardiologist (physician) and Advanced Practice Providers (APPs -  Physician Assistants and Nurse Practitioners) who all work together to provide you with the care you need, when you need it.  We recommend signing up for the patient portal called "MyChart".  Sign up information is provided on this After Visit Summary.  MyChart is used to connect with patients for Virtual Visits (Telemedicine).  Patients are able to view lab/test results, encounter notes, upcoming appointments, etc.  Non-urgent messages can be sent to your provider as well.   To learn more about what you can do with MyChart, go to [email or web address].    Your next appointment:   6 month(s)  Provider:   Micah Flesher, PA-C, Marjie Skiff, PA-C, Azalee Course, PA-C, Bernadene Person, NP, or Reather Littler, NP       Then, Nanetta Batty, MD will plan to see you again in 12 month(s).

## 2023-02-28 NOTE — Assessment & Plan Note (Signed)
History of essential hypertension blood pressure measured today at 124/68.  He is on lisinopril.

## 2023-02-28 NOTE — Progress Notes (Signed)
02/28/2023 Larry Richmond   1950/04/15  [id number]  Primary Physician Clinic, [facility] Primary Cardiologist: Runell Gess MD Nicholes Calam[place], [place]  HPI:  Larry Richmond is a 72 y.o. severely overweight divorced (engaged) African-American male, father of 3, grandfather of 2 grandchildren who is a retired Psychologist, occupational and Agricultural consultant.  He was referred by the emergency room with presented on 01/01/2023 with atypical chest pain.  His risk factors include ongoing tobacco abuse of one third of a pack a day having smoked for 60 years.  He has treated hypertension, diabetes and hyperlipidemia.  There is no family history for heart disease.  He does have back and knee issues as well.  He apparently had COVID-pneumonia back in November 2020.  A chest CT at that time showed a pulmonary embolus for which she was placed on oral anticoagulation for a period of time.  Incidentally noted coronary calcifications in the left main, LAD and RCA symptoms of GERD which respond to antireflux medications.   Current Meds  Medication Sig   albuterol (VENTOLIN HFA) 108 (90 Base) MCG/ACT inhaler Inhale 2 puffs into the lungs in the morning, at noon, in the evening, and at bedtime.   atorvastatin (LIPITOR) 40 MG tablet Take 40 mg by mouth daily.   famotidine (PEPCID) 40 MG tablet Take 1 tablet (40 mg total) by mouth daily.   levothyroxine (SYNTHROID) 100 MCG tablet Take 100 mcg by mouth daily before breakfast.   metFORMIN (GLUCOPHAGE) 500 MG tablet Take 500 mg by mouth daily with breakfast.   pantoprazole (PROTONIX) 40 MG tablet Take 40 mg by mouth 2 (two) times daily before a meal.   [DISCONTINUED] dexamethasone (DECADRON) 6 MG tablet Take 1 tablet (6 mg total) by mouth daily.   [DISCONTINUED] diphenoxylate-atropine (LOMOTIL) 2.5-0.025 MG tablet Take 1 tablet by mouth 4 (four) times daily as needed for diarrhea or loose stools.     No Known Allergies  Social History   Socioeconomic History    Marital status: Significant Other    Spouse name: Not on file   Number of children: 3   Years of education: Not on file   Highest education level: Not on file  Occupational History   Not on file  Tobacco Use   Smoking status: Some Days    Types: Cigarettes   Smokeless tobacco: Never   Tobacco comments:    on occasions  Vaping Use   Vaping status: Never Used  Substance and Sexual Activity   Alcohol use: Yes    Comment: occasional   Drug use: Yes    Types: Marijuana    Comment: Mar 15 2020 last use   Sexual activity: Not on file  Other Topics Concern   Not on file  Social History Narrative   Not on file   Social Drivers of Health   Financial Resource Strain: Not on file  Food Insecurity: Not on file  Transportation Needs: Not on file  Physical Activity: Not on file  Stress: Not on file  Social Connections: Unknown (05/26/2022)   [facility]   Social Network    Social Network: Not on file  Intimate Partner Violence: Unknown (05/26/2022)   Received from [facility], [facility]   HITS    Physically Hurt: Not on file    Insult or Talk Down To: Not on file    Threaten Physical Harm: Not on file    Scream or Curse: Not on  file     Review of Systems: General: negative for chills, fever, night sweats or weight changes.  Cardiovascular: negative for chest pain, dyspnea on exertion, edema, orthopnea, palpitations, paroxysmal nocturnal dyspnea or shortness of breath Dermatological: negative for rash Respiratory: negative for cough or wheezing Urologic: negative for hematuria Abdominal: negative for nausea, vomiting, diarrhea, bright red blood per rectum, melena, or hematemesis Neurologic: negative for visual changes, syncope, or dizziness All other systems reviewed and are otherwise negative except as noted above.    Blood pressure 124/68, pulse 68, height 6\' 2"  (1.88 m), weight (!) 360 lb (163.3 kg).  General appearance: alert and no  distress Neck: no adenopathy, no carotid bruit, no JVD, supple, symmetrical, trachea midline, and thyroid not enlarged, symmetric, no tenderness/mass/nodules Lungs: clear to auscultation bilaterally Heart: regular rate and rhythm, S1, S2 normal, no murmur, click, rub or gallop Extremities: extremities normal, atraumatic, no cyanosis or edema Pulses: Decreased pedal pulses Skin: Skin color, texture, turgor normal. No rashes or lesions Neurologic: Grossly normal  EKG not performed today      ASSESSMENT AND PLAN:   Morbid obesity (HCC) BMI 46.  Tobacco use disorder Currently smoking one third of a pack a day for the last 60 years.  We talked about the importance of smoking cessation.  HLD (hyperlipidemia) History of hyperlipidemia on statin therapy.  We will recheck a lipid liver profile today.  Benign essential HTN History of essential hypertension blood pressure measured today at 124/68.  He is on lisinopril.  Acute pulmonary embolism (HCC) History of acute pulmonary embolism 11//20 by CTA in the setting of COVID-pneumonia.  At the time the CT scan did not mention coronary calcification the left main, LAD and RCA.  OSA on CPAP History of obstructive sleep apnea wearing CPAP occasionally.  Atypical chest pain Patient was seen in the ER 01/01/2023 with atypical chest pain with a negative workup.  Does have positive risk factors.  He said no subsequent symptoms.  If CTA performed 11//20 did reveal coronary calcification in the left main, LAD and RCA.  I am going to get a coronary calcium score to risk stratify.     Runell Gess MD FACP,FACC,FAHA, [facility] 02/28/2023 9:50 AM

## 2023-02-28 NOTE — Assessment & Plan Note (Signed)
History of obstructive sleep apnea wearing CPAP occasionally.

## 2023-02-28 NOTE — Assessment & Plan Note (Signed)
Currently smoking one third of a pack a day for the last 60 years.  We talked about the importance of smoking cessation.

## 2023-02-28 NOTE — Assessment & Plan Note (Signed)
History of acute pulmonary embolism 11//20 by CTA in the setting of COVID-pneumonia.  At the time the CT scan did not mention coronary calcification the left main, LAD and RCA.

## 2023-04-19 ENCOUNTER — Other Ambulatory Visit (HOSPITAL_BASED_OUTPATIENT_CLINIC_OR_DEPARTMENT_OTHER): Payer: No Typology Code available for payment source

## 2023-04-28 ENCOUNTER — Other Ambulatory Visit: Payer: Self-pay

## 2023-04-28 DIAGNOSIS — E782 Mixed hyperlipidemia: Secondary | ICD-10-CM

## 2023-05-06 ENCOUNTER — Emergency Department (HOSPITAL_COMMUNITY): Payer: No Typology Code available for payment source

## 2023-05-06 ENCOUNTER — Emergency Department (HOSPITAL_COMMUNITY)
Admission: EM | Admit: 2023-05-06 | Discharge: 2023-05-07 | Disposition: A | Discharge status: Home / Self Care | Payer: No Typology Code available for payment source | Attending: Emergency Medicine | Admitting: Emergency Medicine

## 2023-05-06 ENCOUNTER — Encounter (HOSPITAL_COMMUNITY): Payer: Self-pay | Admitting: *Deleted

## 2023-05-06 ENCOUNTER — Other Ambulatory Visit: Payer: Self-pay

## 2023-05-06 DIAGNOSIS — I10 Essential (primary) hypertension: Secondary | ICD-10-CM | POA: Diagnosis not present

## 2023-05-06 DIAGNOSIS — Z7984 Long term (current) use of oral hypoglycemic drugs: Secondary | ICD-10-CM | POA: Diagnosis not present

## 2023-05-06 DIAGNOSIS — E119 Type 2 diabetes mellitus without complications: Secondary | ICD-10-CM | POA: Insufficient documentation

## 2023-05-06 DIAGNOSIS — Z79899 Other long term (current) drug therapy: Secondary | ICD-10-CM | POA: Insufficient documentation

## 2023-05-06 DIAGNOSIS — M25511 Pain in right shoulder: Secondary | ICD-10-CM | POA: Diagnosis present

## 2023-05-06 LAB — CBC
HCT: 44.8 % (ref 39.0–52.0)
Hemoglobin: 14.2 g/dL (ref 13.0–17.0)
MCH: 30 pg (ref 26.0–34.0)
MCHC: 31.7 g/dL (ref 30.0–36.0)
MCV: 94.7 fL (ref 80.0–100.0)
Platelets: 208 10*3/uL (ref 150–400)
RBC: 4.73 MIL/uL (ref 4.22–5.81)
RDW: 17.3 % — ABNORMAL HIGH (ref 11.5–15.5)
WBC: 5.8 10*3/uL (ref 4.0–10.5)
nRBC: 0 % (ref 0.0–0.2)

## 2023-05-06 LAB — COMPREHENSIVE METABOLIC PANEL
ALT: 13 U/L (ref 0–44)
AST: 16 U/L (ref 15–41)
Albumin: 3.5 g/dL (ref 3.5–5.0)
Alkaline Phosphatase: 41 U/L (ref 38–126)
Anion gap: 8 (ref 5–15)
BUN: 13 mg/dL (ref 8–23)
CO2: 24 mmol/L (ref 22–32)
Calcium: 9.1 mg/dL (ref 8.9–10.3)
Chloride: 105 mmol/L (ref 98–111)
Creatinine, Ser: 1.36 mg/dL — ABNORMAL HIGH (ref 0.61–1.24)
GFR, Estimated: 55 mL/min — ABNORMAL LOW (ref >60)
Glucose, Bld: 96 mg/dL (ref 70–99)
Potassium: 4.5 mmol/L (ref 3.5–5.1)
Sodium: 137 mmol/L (ref 135–145)
Total Bilirubin: 1.2 mg/dL (ref 0.0–1.2)
Total Protein: 7.5 g/dL (ref 6.5–8.1)

## 2023-05-06 LAB — TROPONIN I (HIGH SENSITIVITY): Troponin I (High Sensitivity): 6 ng/L (ref <18)

## 2023-05-06 MED ORDER — HYDROCODONE-ACETAMINOPHEN 5-325 MG PO TABS
1.0000 | ORAL_TABLET | Freq: Once | ORAL | Status: AC
  Administered 2023-05-06: 1 via ORAL
  Filled 2023-05-06: qty 1

## 2023-05-06 MED ORDER — HYDROCODONE-ACETAMINOPHEN 5-325 MG PO TABS
1.0000 | ORAL_TABLET | Freq: Four times a day (QID) | ORAL | 0 refills | Status: DC | PRN
Start: 2023-05-06 — End: 2023-05-06

## 2023-05-06 MED ORDER — CYCLOBENZAPRINE HCL 10 MG PO TABS: 10.0000 mg | ORAL_TABLET | Freq: Two times a day (BID) | ORAL | 0 refills | Status: DC | PRN

## 2023-05-06 MED ORDER — HYDROCODONE-ACETAMINOPHEN 5-325 MG PO TABS
1.0000 | ORAL_TABLET | Freq: Four times a day (QID) | ORAL | 0 refills | Status: DC | PRN
Start: 2023-05-06 — End: 2023-05-07

## 2023-05-06 NOTE — ED Provider Notes (Signed)
 [place] EMERGENCY DEPARTMENT AT [facility] Provider Note   CSN: [id number] Arrival date & time: 05/06/23  1624     History  Chief Complaint  Patient presents with   Shoulder Pain    Larry Richmond is a 73 y.o. male.   Shoulder Pain    Patient has a history of acid reflux diabetes hyperlipidemia hypertension prior pulm embolism depression arthritis.  Patient presents to the ED with complaints of shoulder pain.  Patient states he has been having symptoms for the last 3 days.  He denies any recent falls or injuries.  Pain is in his right shoulder.  It hurts when he tries to lift his arm.  He denies any fevers chills coughing.  Home Medications Prior to Admission medications   Medication Sig Start Date End Date Taking? Authorizing Provider  albuterol (VENTOLIN HFA) 108 (90 Base) MCG/ACT inhaler Inhale 2 puffs into the lungs in the morning, at noon, in the evening, and at bedtime.    [provider]  atorvastatin (LIPITOR) 40 MG tablet Take 40 mg by mouth daily.    [provider]  cyclobenzaprine (FLEXERIL) 10 MG tablet Take 1 tablet (10 mg total) by mouth 2 (two) times daily as needed for muscle spasms. 05/07/23   Gwyneth Sprout, MD  famotidine (PEPCID) 40 MG tablet Take 1 tablet (40 mg total) by mouth daily. 06/04/21   Meryl Dare, MD  HYDROcodone-acetaminophen (NORCO/VICODIN) 5-325 MG tablet Take 1 tablet by mouth every 6 (six) hours as needed. 05/07/23   Gwyneth Sprout, MD  levothyroxine (SYNTHROID) 100 MCG tablet Take 100 mcg by mouth daily before breakfast.    [provider]  lisinopril (ZESTRIL) 5 MG tablet Take 1 tablet (5 mg total) by mouth daily. Patient not taking: Reported on 02/28/2023 01/20/19   Marinda Elk, MD  metFORMIN (GLUCOPHAGE) 500 MG tablet Take 500 mg by mouth daily with breakfast.    [provider]  pantoprazole (PROTONIX) 40 MG tablet Take 40 mg by mouth 2 (two) times daily before a meal.     [provider]      Allergies    Patient has no known allergies.    Review of Systems   Review of Systems  Physical Exam Updated Vital Signs BP (!) 140/91   Pulse 76   Temp 97.8 F (36.6 C) (Oral)   Resp 20   Ht 1.88 m (6\' 2" )   Wt (!) 163.3 kg   SpO2 94%   BMI 46.22 kg/m  Physical Exam Vitals and nursing note reviewed.  Constitutional:      General: He is not in acute distress.    Appearance: He is well-developed.  HENT:     Head: Normocephalic and atraumatic.     Right Ear: External ear normal.     Left Ear: External ear normal.  Eyes:     General: No scleral icterus.       Right eye: No discharge.        Left eye: No discharge.     Conjunctiva/sclera: Conjunctivae normal.  Neck:     Trachea: No tracheal deviation.  Cardiovascular:     Rate and Rhythm: Normal rate and regular rhythm.  Pulmonary:     Effort: Pulmonary effort is normal. No respiratory distress.     Breath sounds: Normal breath sounds. No stridor. No wheezing or rales.  Abdominal:     General: Bowel sounds are normal. There is no distension.  Palpations: Abdomen is soft.     Tenderness: There is no abdominal tenderness. There is no guarding or rebound.  Musculoskeletal:        General: Tenderness present. No deformity.     Cervical back: Neck supple.     Comments: Mild tenderness palpation right shoulder and trapezius region  Skin:    General: Skin is warm and dry.     Findings: No rash.  Neurological:     General: No focal deficit present.     Mental Status: He is alert.     Cranial Nerves: No cranial nerve deficit, dysarthria or facial asymmetry.     Sensory: No sensory deficit.     Motor: No abnormal muscle tone or seizure activity.     Coordination: Coordination normal.  Psychiatric:        Mood and Affect: Mood normal.     ED Results / Procedures / Treatments   Labs (all labs ordered are listed, but only abnormal results are displayed) Labs Reviewed  COMPREHENSIVE  METABOLIC PANEL - Abnormal; Notable for the following components:      Result Value   Creatinine, Ser 1.36 (*)    GFR, Estimated 55 (*)    All other components within normal limits  CBC - Abnormal; Notable for the following components:   RDW 17.3 (*)    All other components within normal limits  TROPONIN I (HIGH SENSITIVITY)    EKG EKG Interpretation Date/Time:  Friday May 06 2023 17:48:11 EST Ventricular Rate:  88 PR Interval:  158 QRS Duration:  74 QT Interval:  330 QTC Calculation: 399 R Axis:   42  Text Interpretation: Sinus rhythm with Premature atrial complexes Otherwise normal ECG When compared with ECG of 01-Jan-2023 12:13, PREVIOUS ECG IS PRESENT No significant change since last tracing Confirmed by Linwood Dibbles [phone]) on 05/06/2023 10:31:07 PM  Radiology DG Shoulder Right Result Date: 05/06/2023 CLINICAL DATA:  Right shoulder pain EXAM: RIGHT SHOULDER - 2+ VIEW COMPARISON:  None Available. FINDINGS: No fracture or dislocation is seen. Mild degenerative changes of the glenohumeral and acromioclavicular joints. Visualized soft tissues are within normal limits. Visualized right lung is clear. IMPRESSION: Negative. Electronically Signed   By: Charline Bills M.D.   On: 05/06/2023 23:25   DG Chest 2 View Result Date: 05/06/2023 CLINICAL DATA:  Pneumonia 1 month ago, short of breath, right shoulder pain EXAM: CHEST - 2 VIEW COMPARISON:  01/01/2023 FINDINGS: Frontal and lateral views of the chest demonstrate a stable cardiac silhouette. Bullous changes are seen at the right apex, stable. Chronic areas of scarring are seen elsewhere throughout the lungs, greatest at the bases. No acute airspace disease, effusion, or pneumothorax. No acute bony abnormalities. IMPRESSION: 1. 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 No acute airspace disease. Electronically Signed   By: Sharlet Salina M.D.   On: 05/06/2023 18:53    Procedures Procedures    Medications  Ordered in ED Medications  HYDROcodone-acetaminophen (NORCO/VICODIN) 5-325 MG per tablet 1 tablet (1 tablet Oral Given 05/06/23 2305)    ED Course/ Medical Decision Making/ A&P Clinical Course as of 05/07/23 1724  Fri May 06, 2023  2305 Cardiac workup reassuring.  Troponin is normal [JK]  2305 CBC metabolic panel normal.  Chest x-ray without acute abnormalities [JK]    Clinical Course User Index [JK] Linwood Dibbles, MD  Medical Decision Making Amount and/or Complexity of Data Reviewed Labs: ordered. Radiology: ordered.  Risk Prescription drug management.   Presents ED with complaints of shoulder pain.  Appears to be musculoskeletal in nature.  He has palpable tenderness in his right trapezius area.  He has pain with lifting his arm.  Workup is not suggestive of cardiac etiology.  Will discharge home with medications pain.  Discussed outpatient follow-up with orthopedics.        Final Clinical Impression(s) / ED Diagnoses Final diagnoses:  Acute pain of right shoulder    Rx / DC Orders ED Discharge Orders          Ordered    HYDROcodone-acetaminophen (NORCO/VICODIN) 5-325 MG tablet  Every 6 hours PRN,   Status:  Discontinued        05/06/23 2342    cyclobenzaprine (FLEXERIL) 10 MG tablet  2 times daily PRN,   Status:  Discontinued        05/06/23 2342    cyclobenzaprine (FLEXERIL) 10 MG tablet  2 times daily PRN,   Status:  Discontinued        05/06/23 2345    HYDROcodone-acetaminophen (NORCO/VICODIN) 5-325 MG tablet  Every 6 hours PRN,   Status:  Discontinued        05/06/23 2345              Linwood Dibbles, MD 05/07/23 1724

## 2023-05-06 NOTE — ED Notes (Signed)
 Patient transported to X-ray

## 2023-05-06 NOTE — Discharge Instructions (Signed)
The test today in the ED were reassuring.  Take the medication to help with pain in your shoulder.  Consider following up with an orthopedic doctor for further evaluation if the symptoms persist

## 2023-05-06 NOTE — ED Triage Notes (Signed)
The pt is c/o rt shoulder pain for 3 days no known injury his normal meds does not take care of it

## 2023-05-07 ENCOUNTER — Telehealth (HOSPITAL_COMMUNITY): Payer: Self-pay | Admitting: Emergency Medicine

## 2023-05-07 MED ORDER — HYDROCODONE-ACETAMINOPHEN 5-325 MG PO TABS
1.0000 | ORAL_TABLET | Freq: Four times a day (QID) | ORAL | 0 refills | Status: AC | PRN
Start: 2023-05-07 — End: ?

## 2023-05-07 MED ORDER — CYCLOBENZAPRINE HCL 10 MG PO TABS: 10.0000 mg | ORAL_TABLET | Freq: Two times a day (BID) | ORAL | 0 refills | Status: AC | PRN

## 2023-05-07 NOTE — Telephone Encounter (Signed)
 The prescriptions were not received by the pharmacy.  New prescriptions were sent.

## 2023-06-09 NOTE — Progress Notes (Addendum)
 06/10/2023 [name] [id number] 03-Feb-1951  Referring provider: [facility] Primary GI doctor: Dr. Doy Hutching  ASSESSMENT AND PLAN:   History of colon polyps Colonoscopy 2022 with Dr. Russella Dar good bowel prep difficult due to patient's body habitus and respiratory motion showed 13 polyps 6 to 10 mm sigmoid descending transverse descending cecum, ascending colon lipoma, diverticulosis left colon, internal hemorrhoids, recall 2 years BRB in stool this week, has constipation, BM daily with straining, rectal Will plan on colon and EGD at [facility] with 2 day prep We have discussed the risks of bleeding, infection, perforation, medication reactions, and remote risk of death associated with colonoscopy. All questions were answered and the patient acknowledges these risk and wishes to proceed.  GERD with history of esophagitis, 5 cm hiatal hernia Endoscopy 2023 LA grade C esophagitis otherwise normal medium size hiatal hernia 5 [place] grade 3 duodenitis Pathology unremarkable. 05/06/2023 Hgb 14.2 no anemia no leukocytosis normal platelets Pantoprazole 40 mg daily sometimes after food, on pepcid at night, having significant GERD symptoms despite medical therapy Dysphagia for a year, feels gets stuck lower AB with some harding of his AB at the time, has regurgiation/vomiting worse with lying down.  Lifestyle changes discussed, avoid NSAIDS, ETOH, hand out given to the patient Weight loss discussed with the patient Smoking cessation discussed in detail Concern for esophagitis, hiatal hernia, gastritis, etc will repeat EGD with colon Start on protonix 40 mg increase BID, emphasized PPI 30 minutes- 1hour before food I discussed risks of EGD with patient today, including risk of sedation, bleeding or perforation.  Patient provides understanding and gave verbal consent to proceed.. 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  -Patient has been advised to make an attempt to improve diet and exercise patterns to aid in weight loss. -Recommended diet heavy in fruits and veggies and low in animal meats, cheeses, and dairy products, appropriate calorie intake   I have reviewed the clinic note as outlined by Quentin Mulling, PA and agree with the assessment, plan and medical decision making.  Mr. Plante has a history of multiple colon tubular adenomas in 2022 and is due for colon polyp surveillance.  Has also recently had symptoms of blood in stool.  Previously had EGD in 2023 demonstrating LA grade C esophagitis and a hiatal hernia.  He is endorsing symptoms of dysphagia.  It is reasonable to proceed with EGD and colonoscopy at the same time.  I agree with updated imaging to reevaluate previously identified liver lesion in 2020.  If there are abnormalities on [place] consider proceeding with MRI.  Maren Beach, MD   Patient Care Team: [facility] as PCP - General Allyson Sabal Delton See, MD as Consulting Physician (Cardiology)  HISTORY OF PRESENT ILLNESS: 73 y.o. male with a past medical history listed below presents for evaluation for colonoscopy and evaluation of GERD from the [facility].  Discussed the use of AI scribe software for clinical note transcription with the patient, who gave verbal consent to proceed.  History of Present Illness  KEMARION ABBEY is a 73 year old male with a history of polyps and diverticulosis who presents for follow-up of gastrointestinal issues.  He had a colonoscopy in 2022 which revealed 13 polyps ranging from 6 to 10 millimeters, as well as  diverticulosis and hemorrhoids. He experiences occasional bright red blood in his stool and constipation, with bowel movements occurring every morning but sometimes requiring straining.  In 2023, he underwent an endoscopy which showed significant inflammation in the esophagus and a hiatal hernia. He experiences frequent heartburn and is currently taking pantoprazole once daily, although he sometimes forgets to take it before meals. He also uses famotidine in the evening, sometimes taking up to three tablets for relief. He reports difficulty swallowing solids, with food feeling stuck in his throat almost daily for the past year, and experiences regurgitation when lying down, accompanied by nausea.  He has a history of thyroid removal and reports shortness of breath, wheezing, and mucus production. He uses Butisol as needed for wheezing but does not have a maintenance inhaler. No fever or chills.  He occasionally smokes cigarettes and consumes very little alcohol. He uses a CPAP machine at night but is not on oxygen therapy. He is diabetic and takes an oral medication, though he does not recall the name.      He  reports that he has been smoking cigarettes. He has never used smokeless tobacco. He reports current alcohol use. He reports current drug use. Drug: Marijuana.  RELEVANT GI HISTORY, IMAGING AND LABS: Results   DIAGNOSTIC Colonoscopy: 13 polyps ranging 6-10 mm, diverticulosis, hemorrhoids (2022) Endoscopy: Esophagitis, hiatal hernia (2023)      CBC    Component Value Date/Time   WBC 5.8 05/06/2023 1736   RBC 4.73 05/06/2023 1736   HGB 14.2 05/06/2023 1736   HCT 44.8 05/06/2023 1736   PLT 208 05/06/2023 1736   MCV 94.7 05/06/2023 1736   MCH 30.0 05/06/2023 1736   MCHC 31.7 05/06/2023 1736   RDW 17.3 (H) 05/06/2023 1736   LYMPHSABS 1.1 01/01/2023 0828   MONOABS 0.3 01/01/2023 0828   EOSABS 0.1 01/01/2023 0828   BASOSABS 0.0 01/01/2023 0828   Recent Labs    01/01/23 0828  05/06/23 1736  HGB 14.7 14.2    CMP     Component Value Date/Time   NA 137 05/06/2023 1736   K 4.5 05/06/2023 1736   CL 105 05/06/2023 1736   CO2 24 05/06/2023 1736   GLUCOSE 96 05/06/2023 1736   BUN 13 05/06/2023 1736   CREATININE 1.36 (H) 05/06/2023 1736   CALCIUM 9.1 05/06/2023 1736   PROT 7.5 05/06/2023 1736   PROT 7.0 02/28/2023 1010   ALBUMIN 3.5 05/06/2023 1736   ALBUMIN 3.8 02/28/2023 1010   AST 16 05/06/2023 1736   ALT 13 05/06/2023 1736   ALKPHOS 41 05/06/2023 1736   BILITOT 1.2 05/06/2023 1736   BILITOT 0.7 02/28/2023 1010   GFRNONAA 55 (L) 05/06/2023 1736   GFRAA >60 01/17/2019 0425      Latest Ref Rng & Units 05/06/2023    5:36 PM 02/28/2023   10:10 AM 11/04/2020    9:11 PM  Hepatic Function  Total Protein 6.5 - 8.1 g/dL 7.5  7.0  7.4   Albumin 3.5 - 5.0 g/dL 3.5  3.8  3.6   AST 15 - 41 U/L 16  17  21    ALT 0 - 44 U/L 13  17  22    Alk Phosphatase 38 - 126 U/L 41  55  49   Total Bilirubin 0.0 - 1.2 mg/dL 1.2  0.7  1.3   Bilirubin, Direct 0.00 - 0.40 mg/dL  9.60        Current Medications:   Current Outpatient Medications (Endocrine & Metabolic):    levothyroxine (SYNTHROID) 100 MCG tablet, Take 100 mcg by mouth daily before breakfast.   metFORMIN (GLUCOPHAGE-XR) 500 MG 24 hr tablet, Take 500 mg by mouth daily with breakfast.  Current Outpatient Medications (Cardiovascular):    atorvastatin (LIPITOR) 80 MG tablet, Take 80 mg by mouth daily.   furosemide (LASIX) 20 MG tablet, Take 20 mg by mouth 2 (two) times daily.   lisinopril (ZESTRIL) 2.5 MG tablet, Take 1 tablet by mouth daily.   prazosin (MINIPRESS) 2 MG capsule, Take 2 mg by mouth at bedtime.   sildenafil (VIAGRA) 100 MG tablet, Take 50 mg by mouth as needed.  Current Outpatient Medications (Respiratory):    albuterol (VENTOLIN HFA) 108 (90 Base) MCG/ACT inhaler, Inhale 2 puffs into the lungs in the morning, at noon, in the evening, and at bedtime.  Current Outpatient Medications  (Analgesics):    acetaminophen (TYLENOL) 500 MG tablet, Take 1,000 mg by mouth 3 (three) times daily as needed.   HYDROcodone-acetaminophen (NORCO/VICODIN) 5-325 MG tablet, Take 1 tablet by mouth every 6 (six) hours as needed.  Current Outpatient Medications (Hematological):    folic acid (FOLVITE) 1 MG tablet, Take 1 mg by mouth daily.  Current Outpatient Medications (Other):    B Complex Vitamins (VITAMIN B COMPLEX) CAPS, Take 2 capsules by mouth daily.   buPROPion (WELLBUTRIN XL) 300 MG 24 hr tablet, Take 300 mg by mouth daily.   Cholecalciferol 25 MCG (1000 UT) TBDP, Take 1 tablet by mouth daily.   cyclobenzaprine (FLEXERIL) 10 MG tablet, Take 1 tablet (10 mg total) by mouth 2 (two) times daily as needed for muscle spasms.   DULoxetine (CYMBALTA) 30 MG capsule, Take 90 mg by mouth daily.   famotidine (PEPCID) 40 MG tablet, Take 1 tablet (40 mg total) by mouth daily.   lidocaine (LIDODERM) 5 %, Place 1 patch onto the skin every 12 (twelve) hours.   Na Sulfate-K Sulfate-Mg Sulfate concentrate (SUPREP) 17.5-3.13-1.6 GM/177ML SOLN, Take 1 kit (354 mLs total) by mouth once for 1 dose.   Oyster Shell Calcium 500 MG TABS, Take 1 tablet by mouth 3 (three) times daily.   pantoprazole (PROTONIX) 40 MG tablet, Take 40 mg by mouth 2 (two) times daily before a meal.   potassium chloride SA (KLOR-CON M) 20 MEQ tablet, Take 20 mEq by mouth 2 (two) times daily.   pregabalin (LYRICA) 150 MG capsule, Take 150 mg by mouth 2 (two) times daily.   silodosin (RAPAFLO) 8 MG CAPS capsule, Take 8 mg by mouth daily.  Medical History:  Past Medical History:  Diagnosis Date   Anxiety    Arthritis    Benign essential HTN 01/13/2019   Blood transfusion without reported diagnosis    Clotting disorder (HCC)    Colon polyps    Depression    Diabetes mellitus without complication (HCC)    GERD (gastroesophageal reflux disease)    HTN (hypertension)    Hyperlipidemia    Obesity    Pneumonia    Pulmonary  embolism (HCC)    Sleep apnea    Thyroid disease    Tubular adenoma of colon 2017   Allergies: No Known Allergies   Surgical History:  He  has a past surgical history that includes Thyroid  surgery; lipoma cyst; and Colonoscopy. Family History:  His family history includes Alzheimer's disease in his mother; Cancer in his father; Kidney disease in his father.  REVIEW OF SYSTEMS  : All other systems reviewed and negative except where noted in the History of Present Illness.  PHYSICAL EXAM: BP 134/80 (BP Location: Left Arm, Patient Position: Sitting, Cuff Size: Large)   Pulse 64   Ht 6' (1.829 m) Comment: height measured without shoes  Wt (!) 365 lb (165.6 kg)   BMI 49.50 kg/m  Physical Exam   MEASUREMENTS: BMI- 49.5. GENERAL APPEARANCE: obese, in no apparent distress. HEENT: No cervical lymphadenopathy, unremarkable thyroid, sclerae anicteric, conjunctiva pink. RESPIRATORY: Respiratory effort normal, decreased breath sounds, no wheeze, BS equal bilateral without rales, rhonchi. CARDIO: RRR with no MRGs, peripheral pulses intact. ABDOMEN: Soft, non-distended, active bowel sounds in all 4 quadrants, non-tender to palpation, no rebound, no mass appreciated. RECTAL: Declines. MUSCULOSKELETAL: Full ROM, antalgic gait, able to get on table with minimal assistance, with non pitting edema. SKIN: Dry, intact without rashes or lesions. No jaundice. NEURO: Alert, oriented, no focal deficits. PSYCH: Cooperative, normal mood and affect.   Doree Albee, PA-C 10:24 AM

## 2023-06-10 ENCOUNTER — Telehealth: Payer: Self-pay | Admitting: Physician Assistant

## 2023-06-10 ENCOUNTER — Ambulatory Visit: Payer: No Typology Code available for payment source | Admitting: Physician Assistant

## 2023-06-10 ENCOUNTER — Encounter: Payer: Self-pay | Admitting: Physician Assistant

## 2023-06-10 VITALS — BP 134/80 | HR 64 | Ht 72.0 in | Wt 365.0 lb

## 2023-06-10 DIAGNOSIS — K219 Gastro-esophageal reflux disease without esophagitis: Secondary | ICD-10-CM

## 2023-06-10 DIAGNOSIS — E1121 Type 2 diabetes mellitus with diabetic nephropathy: Secondary | ICD-10-CM

## 2023-06-10 DIAGNOSIS — K760 Fatty (change of) liver, not elsewhere classified: Secondary | ICD-10-CM | POA: Diagnosis not present

## 2023-06-10 DIAGNOSIS — K449 Diaphragmatic hernia without obstruction or gangrene: Secondary | ICD-10-CM | POA: Diagnosis not present

## 2023-06-10 DIAGNOSIS — Z6841 Body Mass Index (BMI) 40.0 and over, adult: Secondary | ICD-10-CM

## 2023-06-10 DIAGNOSIS — Z7984 Long term (current) use of oral hypoglycemic drugs: Secondary | ICD-10-CM

## 2023-06-10 DIAGNOSIS — J431 Panlobular emphysema: Secondary | ICD-10-CM

## 2023-06-10 DIAGNOSIS — R1310 Dysphagia, unspecified: Secondary | ICD-10-CM | POA: Diagnosis not present

## 2023-06-10 DIAGNOSIS — Z8719 Personal history of other diseases of the digestive system: Secondary | ICD-10-CM

## 2023-06-10 DIAGNOSIS — F17200 Nicotine dependence, unspecified, uncomplicated: Secondary | ICD-10-CM

## 2023-06-10 DIAGNOSIS — Z860101 Personal history of adenomatous and serrated colon polyps: Secondary | ICD-10-CM

## 2023-06-10 DIAGNOSIS — J449 Chronic obstructive pulmonary disease, unspecified: Secondary | ICD-10-CM

## 2023-06-10 DIAGNOSIS — G4733 Obstructive sleep apnea (adult) (pediatric): Secondary | ICD-10-CM

## 2023-06-10 DIAGNOSIS — F17210 Nicotine dependence, cigarettes, uncomplicated: Secondary | ICD-10-CM

## 2023-06-10 MED ORDER — NA SULFATE-K SULFATE-MG SULF 17.5-3.13-1.6 GM/177ML PO SOLN: 1.0000 | Freq: Once | ORAL | 0 refills | Status: AC

## 2023-06-10 NOTE — Patient Instructions (Addendum)
 Please take your proton pump inhibitor medication, protonix 40 mg TWICE day  Please take this medication 30 minutes to 1 hour before meals- this makes it more effective.  Avoid spicy and acidic foods Avoid fatty foods Limit your intake of coffee, tea, alcohol, and carbonated drinks Work to maintain a healthy weight Keep the head of the bed elevated at least 3 inches with blocks or a wedge pillow if you are having any nighttime symptoms Stay upright for 2 hours after eating Avoid meals and snacks three to four hours before bedtime Stop smoking  Miralax is an osmotic laxative.  It only brings more water into the stool.  This is safe to take daily.  Can take up to 17 gram of miralax twice a day.  Mix with juice or coffee.  Start 1 capful at night for 3-4 days and reassess your response in 3-4 days.  You can increase and decrease the dose based on your response.  Remember, it can take up to 3-4 days to take effect OR for the effects to wear off.   I often pair this with benefiber in the morning to help assure the stool is not too loose.   You have been scheduled for an abdominal ultrasound at [facility] (1st floor of hospital) on Friday 06/17/23 at 8:30am. Please arrive 15 minutes prior to your appointment for registration. Make certain not to have anything to eat or drink 6 hours prior to your appointment. Should you need to reschedule your appointment, please contact radiology at [phone]. This test typically takes about 30 minutes to perform.  You have been scheduled for an endoscopy and colonoscopy. Please follow the written instructions given to you at your visit today.  If you use inhalers (even only as needed), please bring them with you on the day of your procedure.  DO NOT TAKE 7 DAYS PRIOR TO TEST- Trulicity (dulaglutide) Ozempic, Wegovy (semaglutide) Mounjaro (tirzepatide) Bydureon Bcise (exanatide extended release)  DO NOT TAKE 1 DAY PRIOR TO YOUR TEST Rybelsus  (semaglutide) Adlyxin (lixisenatide) Victoza (liraglutide) Byetta (exanatide) _______________________________________________________________________  Toileting tips to help with your constipation - Drink at least 64-80 ounces of water/liquid per day. - Establish a time to try to move your bowels every day.  For many people, this is after a cup of coffee or after a meal such as breakfast. - Sit all of the way back on the toilet keeping your back fairly straight and while sitting up, try to rest the tops of your forearms on your upper thighs.   - Raising your feet with a step stool/squatty potty can be helpful to improve the angle that allows your stool to pass through the rectum. - Relax the rectum feeling it bulge toward the toilet water.  If you feel your rectum raising toward your body, you are contracting rather than relaxing. - Breathe in and slowly exhale. "Belly breath" by expanding your belly towards your belly button. Keep belly expanded as you gently direct pressure down and back to the anus.  A low pitched GRRR sound can assist with increasing intra-abdominal pressure.  (Can also trying to blow on a pinwheel and make it move, this helps with the same belly breathing) - Repeat 3-4 times. If unsuccessful, contract the pelvic floor to restore normal tone and get off the toilet.  Avoid excessive straining. - To reduce excessive wiping by teaching your anus to normally contract, place hands on outer aspect of knees and resist knee movement outward.  Hold 5-10 second then place hands just inside of knees and resist inward movement of knees.  Hold 5 seconds.  Repeat a few times each way.  Go to the ER if unable to pass gas, severe AB pain, unable to hold down food, any shortness of breath of chest pain.   Hiatal Hernia  A hiatal hernia occurs when part of the stomach slides above the muscle that separates the abdomen from the chest (diaphragm). A person can be born with a hiatal hernia  (congenital), or it may develop over time. In almost all cases of hiatal hernia, only the top part of the stomach pushes through the diaphragm. Many people have a hiatal hernia with no symptoms. The larger the hernia, the more likely it is that you will have symptoms. In some cases, a hiatal hernia allows stomach acid to flow back into the tube that carries food from your mouth to your stomach (esophagus). This may cause heartburn symptoms. The development of heartburn symptoms may mean that you have a condition called gastroesophageal reflux disease (GERD). What are the causes? This condition is caused by a weakness in the opening (hiatus) where the esophagus passes through the diaphragm to attach to the upper part of the stomach. A person may be born with a weakness in the hiatus, or a weakness can develop over time. What increases the risk? This condition is more likely to develop in: Older people. Age is a major risk factor for a hiatal hernia, especially if you are over the age of 55. Pregnant women. People who are overweight. People who have frequent constipation. What are the signs or symptoms? Symptoms of this condition usually develop in the form of GERD symptoms. Symptoms include: Heartburn. Upset stomach (indigestion). Trouble swallowing. Coughing or wheezing. Wheezing is making high-pitched whistling sounds when you breathe. Sore throat. Chest pain. Nausea and vomiting. How is this diagnosed? This condition may be diagnosed during testing for GERD. Tests that may be done include: X-rays of your stomach or chest. An upper gastrointestinal (GI) series. This is an X-ray exam of your GI tract that is taken after you swallow a chalky liquid that shows up clearly on the X-ray. Endoscopy. This is a procedure to look into your stomach using a thin, flexible tube that has a tiny camera and light on the end of it. How is this treated? This condition may be treated by: Dietary and  lifestyle changes to help reduce GERD symptoms. Medicines. These may include: Over-the-counter antacids. Medicines that make your stomach empty more quickly. Medicines that block the production of stomach acid (H2 blockers). Stronger medicines to reduce stomach acid (proton pump inhibitors). Surgery to repair the hernia, if other treatments are not helping. If you have no symptoms, you may not need treatment. Follow these instructions at home: Lifestyle and activity Do not use any products that contain nicotine or tobacco. These products include cigarettes, chewing tobacco, and vaping devices, such as e-cigarettes. If you need help quitting, ask your health care provider. Try to achieve and maintain a healthy body weight. Avoid putting pressure on your abdomen. Anything that puts pressure on your abdomen increases the amount of acid that may be pushed up into your esophagus. Avoid bending over, especially after eating. Raise the head of your bed by putting blocks under the legs. This keeps your head and esophagus higher than your stomach. Do not wear tight clothing around your chest or stomach. Try not to strain when having a bowel movement, when  urinating, or when lifting heavy objects. Eating and drinking Avoid foods that can worsen GERD symptoms. These may include: Fatty foods, like fried foods. Citrus fruits, like oranges or lemon. Other foods and drinks that contain acid, like orange juice or tomatoes. Spicy food. Chocolate. Eat frequent small meals instead of three large meals a day. This helps prevent your stomach from getting too full. Eat slowly. Do not lie down right after eating. Do not eat 1-2 hours before bed. Do not drink beverages with caffeine. These include cola, coffee, cocoa, and tea. Do not drink alcohol. General instructions Take over-the-counter and prescription medicines only as told by your health care provider. Keep all follow-up visits. Your health care  provider will want to check that any new prescribed medicines are helping your symptoms. Contact a health care provider if: Your symptoms are not controlled with medicines or lifestyle changes. You are having trouble swallowing. You have coughing or wheezing that will not go away. Your pain is getting worse. Your pain spreads to your arms, neck, jaw, teeth, or back. You feel nauseous or you vomit. Get help right away if: You have shortness of breath. You vomit blood. You have bright red blood in your stools. You have black, tarry stools. These symptoms may be an emergency. Get help right away. Call 911. Do not wait to see if the symptoms will go away. Do not drive yourself to the hospital. Summary A hiatal hernia occurs when part of the stomach slides above the muscle that separates the abdomen from the chest. A person may be born with a weakness in the hiatus, or a weakness can develop over time. Symptoms of a hiatal hernia may include heartburn, trouble swallowing, or sore throat. Management of a hiatal hernia includes eating frequent small meals instead of three large meals a day. Get help right away if you vomit blood, have bright red blood in your stools, or have black, tarry stools. This information is not intended to replace advice given to you by your health care provider. Make sure you discuss any questions you have with your health care provider. Document Revised: 04/28/2021 Document Reviewed: 04/28/2021 Elsevier Patient Education  2024 [employer].

## 2023-06-10 NOTE — Telephone Encounter (Signed)
 Inbound call from [facility] pharmacy stating they are unable to fill Suprep. States they are able to fill golytely or movi prep. Please advise, thank you.

## 2023-06-17 ENCOUNTER — Ambulatory Visit (HOSPITAL_COMMUNITY)
Admission: RE | Admit: 2023-06-17 | Discharge: 2023-06-17 | Disposition: A | Discharge status: Home / Self Care | Source: Ambulatory Visit | Attending: Physician Assistant | Admitting: Physician Assistant

## 2023-06-17 DIAGNOSIS — K760 Fatty (change of) liver, not elsewhere classified: Secondary | ICD-10-CM | POA: Insufficient documentation

## 2023-06-20 ENCOUNTER — Other Ambulatory Visit: Payer: Self-pay

## 2023-06-20 DIAGNOSIS — K760 Fatty (change of) liver, not elsewhere classified: Secondary | ICD-10-CM

## 2023-06-20 DIAGNOSIS — R160 Hepatomegaly, not elsewhere classified: Secondary | ICD-10-CM

## 2023-06-22 ENCOUNTER — Ambulatory Visit
Payer: No Typology Code available for payment source | Attending: Cardiovascular Disease | Admitting: Pharmacist Clinician (PhC)/ Clinical Pharmacy Specialist

## 2023-06-22 DIAGNOSIS — E782 Mixed hyperlipidemia: Secondary | ICD-10-CM

## 2023-06-22 NOTE — Progress Notes (Unsigned)
 Office Visit    Patient Name: Larry Richmond Date of Encounter: 06/22/2023  Primary Care Provider:  [facility] Primary Cardiologist:  None  Chief Complaint    Hyperlipidemia   Significant Past Medical History   CAD Noted on chest CT angiogram; has calcium scoring ordered  preDM 2020 A1c 6.0     No Known Allergies  History of Present Illness    Larry Richmond is a 73 y.o. male patient of Dr Allyson Sabal, in the office today to discuss options for cholesterol management.  Insurance Carrier:  Dept of [employer]:   [facility]  Healthwell:   no - government sponsored plan  LDL Cholesterol goal:  LDL < 70  Current Medications:   atorvastatin 80 mg every day (started after Dec 24 lab draw)  Family Hx:   father had MI, heart trouble, died at 68; mother also with heart disease, mother was 23 (dementia); brother with no heart disease; children healthy  Social Hx: Tobacco: occasional cigarette (1 ppw) Alcohol:    very little   Diet:   eats out regularly; mix of fast foods and sit down   Exercise:  none - needs knee replacement;    Accessory Clinical Findings   Lab Results  Component Value Date   CHOL 236 (H) 02/28/2023   HDL 45 02/28/2023   LDLCALC 178 (H) 02/28/2023   TRIG 73 02/28/2023   CHOLHDL 5.2 (H) 02/28/2023    No results found for: "LIPOA"  Lab Results  Component Value Date   ALT 13 05/06/2023   AST 16 05/06/2023   ALKPHOS 41 05/06/2023   BILITOT 1.2 05/06/2023   Lab Results  Component Value Date   CREATININE 1.36 (H) 05/06/2023   BUN 13 05/06/2023   NA 137 05/06/2023   K 4.5 05/06/2023   CL 105 05/06/2023   CO2 24 05/06/2023   Lab Results  Component Value Date   HGBA1C 6.0 (H) 01/05/2019    Home Medications    Current Outpatient Medications  Medication Sig Dispense Refill   acetaminophen (TYLENOL) 500 MG tablet Take 1,000 mg by mouth 3 (three) times daily as needed.     albuterol (VENTOLIN HFA) 108 (90 Base) MCG/ACT  inhaler Inhale 2 puffs into the lungs in the morning, at noon, in the evening, and at bedtime.     atorvastatin (LIPITOR) 80 MG tablet Take 80 mg by mouth daily.     B Complex Vitamins (VITAMIN B COMPLEX) CAPS Take 2 capsules by mouth daily.     buPROPion (WELLBUTRIN XL) 300 MG 24 hr tablet Take 300 mg by mouth daily.     Cholecalciferol 25 MCG (1000 UT) TBDP Take 1 tablet by mouth daily.     cyclobenzaprine (FLEXERIL) 10 MG tablet Take 1 tablet (10 mg total) by mouth 2 (two) times daily as needed for muscle spasms. 20 tablet 0   DULoxetine (CYMBALTA) 30 MG capsule Take 90 mg by mouth daily.     famotidine (PEPCID) 40 MG tablet Take 1 tablet (40 mg total) by mouth daily. 30 tablet 1   folic acid (FOLVITE) 1 MG tablet Take 1 mg by mouth daily.     furosemide (LASIX) 20 MG tablet Take 20 mg by mouth 2 (two) times daily.     HYDROcodone-acetaminophen (NORCO/VICODIN) 5-325 MG tablet Take 1 tablet by mouth every 6 (six) hours as needed. 12 tablet 0   levothyroxine (SYNTHROID) 100 MCG tablet Take 100 mcg by mouth daily before breakfast.  lidocaine (LIDODERM) 5 % Place 1 patch onto the skin every 12 (twelve) hours.     lisinopril (ZESTRIL) 2.5 MG tablet Take 1 tablet by mouth daily.     metFORMIN (GLUCOPHAGE-XR) 500 MG 24 hr tablet Take 500 mg by mouth daily with breakfast.     Oyster Shell Calcium 500 MG TABS Take 1 tablet by mouth 3 (three) times daily.     pantoprazole (PROTONIX) 40 MG tablet Take 40 mg by mouth 2 (two) times daily before a meal.     potassium chloride SA (KLOR-CON M) 20 MEQ tablet Take 20 mEq by mouth 2 (two) times daily.     prazosin (MINIPRESS) 2 MG capsule Take 2 mg by mouth at bedtime.     pregabalin (LYRICA) 150 MG capsule Take 150 mg by mouth 2 (two) times daily.     sildenafil (VIAGRA) 100 MG tablet Take 50 mg by mouth as needed.     silodosin (RAPAFLO) 8 MG CAPS capsule Take 8 mg by mouth daily.     No current facility-administered medications for this visit.      Assessment & Plan    No problem-specific Assessment & Plan notes found for this encounter.   Phillips Hay, PharmD CPP [facility] [address]  [place], [place] [address] [phone]  06/22/2023, 12:45 PM

## 2023-06-22 NOTE — Patient Instructions (Addendum)
 Your Results:             Your most recent labs Goal  Total Cholesterol 236 < 200  Triglycerides 73 < 150  HDL (happy/good cholesterol) 45 > 40  LDL (lousy/bad cholesterol 178 < 70   Medication changes:  We will start the process to get Repatha/Praluent covered by your insurance.  The [facility] should let you know whether it is approved or not.  I will reach out to our social worker Octavio Graves) to see if we can help with the copay for the coronary calcium scoring.  --  Lab orders:  We want to repeat labs after 2-3 months.  We will send you a lab order to remind you once we get closer to that time.     Thank you for choosing [facility]

## 2023-06-23 ENCOUNTER — Encounter: Payer: Self-pay | Admitting: Pharmacist Clinician (PhC)/ Clinical Pharmacy Specialist

## 2023-06-23 ENCOUNTER — Ambulatory Visit (HOSPITAL_COMMUNITY)
Admission: RE | Admit: 2023-06-23 | Discharge: 2023-06-23 | Disposition: A | Discharge status: Home / Self Care | Source: Ambulatory Visit | Attending: Physician Assistant | Admitting: Physician Assistant

## 2023-06-23 ENCOUNTER — Telehealth: Payer: Self-pay | Admitting: Pharmacist Clinician (PhC)/ Clinical Pharmacy Specialist

## 2023-06-23 DIAGNOSIS — R160 Hepatomegaly, not elsewhere classified: Secondary | ICD-10-CM | POA: Diagnosis present

## 2023-06-23 DIAGNOSIS — K760 Fatty (change of) liver, not elsewhere classified: Secondary | ICD-10-CM | POA: Insufficient documentation

## 2023-06-23 MED ORDER — GADOBUTROL 1 MMOL/ML IV SOLN
10.0000 mL | Freq: Once | INTRAVENOUS | Status: AC | PRN
  Administered 2023-06-23: 10 mL via INTRAVENOUS

## 2023-06-23 NOTE — Assessment & Plan Note (Signed)
 Assessment: Patient with CAD not at LDL goal of < 70 Most recent LDL 178 on 02/28/23 Has been compliant with high intensity statin: atorvastatin 80 mg  Not able to tolerate statins secondary to myalgias Reviewed options for lowering LDL cholesterol, including ezetimibe, PCSK-9 inhibitors, bempedoic acid and inclisiran.  Discussed mechanisms of action, dosing, side effects, potential decreases in LDL cholesterol and costs.  Also reviewed potential options for patient assistance.  Plan: Patient agreeable to starting Repatha 140 mg q14d Repeat labs after:  3 months Lipid Liver function Not eligible for [employer] as insurance is government funded

## 2023-06-23 NOTE — Telephone Encounter (Signed)
 Please do PA for Repatha - pt is [facility] only

## 2023-06-24 ENCOUNTER — Telehealth (HOSPITAL_BASED_OUTPATIENT_CLINIC_OR_DEPARTMENT_OTHER): Payer: Self-pay | Admitting: Licensed Clinical Social Worker

## 2023-06-24 ENCOUNTER — Telehealth: Payer: Self-pay

## 2023-06-24 ENCOUNTER — Other Ambulatory Visit (HOSPITAL_COMMUNITY): Payer: Self-pay

## 2023-06-24 MED ORDER — REPATHA SURECLICK 140 MG/ML ~~LOC~~ SOAJ
140.0000 mg | SUBCUTANEOUS | 3 refills | Status: AC
Start: 2023-06-24 — End: ?

## 2023-06-24 NOTE — Progress Notes (Signed)
 Heart and Vascular Care Navigation  06/24/2023  Larry Richmond October 11, 1950 [id number]  Reason for Referral: patient care fund referral Patient is participating in a Managed Medicaid Plan:No, VA and Medicare Part A only  Engaged with patient by telephone for initial visit for Heart and Vascular Care Coordination.                                                                                                   Assessment:        LCSW was able to reach pt at [phone]. Confirmed home address, PCP, and that he receives medications etc through [place]. He resides with his significant other and they manage expenses with their social security income. He denies any issues with affording their housing, utilities, transportation, food or medications. At this time he would like to f/u with his family about assistance with the cost of calcium score test before pursuing any additional assistance. Encouraged him to call me back as needed for additional assistance options.                               HRT/VAS Care Coordination     Patients Home Cardiology Office [facility]   Outpatient Care Team Social Worker   Social Worker Name: Nathen Balder, [place], [phone]   Living arrangements for the past 2 months Single Family Home   Lives with: Significant Other   Patient Current Insurance Coverage [facility]; Traditional Medicare  Part A only   Patient Has Concern With Paying Medical Bills No   Does Patient Have Prescription Coverage? Yes   Home Assistive Devices/Equipment Cane (specify quad or straight)   DME Agency [facility]   [facility] Agency NA       Social History:                                                                             SDOH Screenings   Food Insecurity: No Food Insecurity (06/24/2023)  Housing: Low Risk  (06/24/2023)  Transportation Needs: No Transportation Needs (06/24/2023)  Utilities: Not At Risk (06/24/2023)  Financial Resource Strain: Medium Risk (06/24/2023)  Social  Connections: Unknown (05/26/2022)   Received from [facility], [facility]  Tobacco Use: High Risk (06/23/2023)  Health Literacy: Adequate Health Literacy (06/24/2023)    SDOH Interventions: Financial Resources:  Financial Strain Interventions: Other (Comment) (fixed income- discussed patient care fund to assist with costs to free money for cost of calcium score, shares he will ask his kids)  Food Insecurity:  Food Insecurity Interventions: Intervention Not Indicated  Housing Insecurity:  Housing Interventions: Intervention Not Indicated  Transportation:   Transportation Interventions: Intervention Not Indicated    Other Care Navigation Interventions:     Provided Pharmacy assistance resources  Pt denies  any issues obtaining medications- uses [facility]   Follow-up plan:   Pt shares he will reach out to his children for assistance with $99 cost for calcium score- if they cannot assist he will reach back out to this writer for further assistance

## 2023-06-24 NOTE — Telephone Encounter (Signed)
 Rx sent to [facility]

## 2023-06-24 NOTE — Telephone Encounter (Signed)
 [facility] reached out about prior auth for Repatha. Patient gets medications directly from the [place] and doesn't have a pharmacy card to initiate the PA with. Advised [facility] to send in script electronically to the [place]. If they require the PA they will reach out then via fax for [place] to complete auth questions.

## 2023-06-24 NOTE — Telephone Encounter (Signed)
 There isn't a pharmacy card for [place] to initiate the PA with like [facility] or [facility]. If patient is getting it from the [place] directly, you should be able to send them the script electronically. Once they process the escript, if they need prior auth questions answered, typically at that point they would send [place] a fax letting us know to complete.

## 2023-06-28 ENCOUNTER — Telehealth: Payer: Self-pay

## 2023-06-28 DIAGNOSIS — Z860101 Personal history of adenomatous and serrated colon polyps: Secondary | ICD-10-CM

## 2023-06-28 MED ORDER — PEG-KCL-NACL-NASULF-NA ASC-C 100 G PO SOLR: 1.0000 | Freq: Once | ORAL | Status: AC

## 2023-06-28 NOTE — Telephone Encounter (Signed)
 I spoke to [place] and I advised him that the [place] has requested an alternate colonoscopy prep to be sent in.  I advised him the we are sending in Moviprep and I am mailing new instructions today also.

## 2023-06-28 NOTE — Telephone Encounter (Signed)
 Moviprep sent in to replace Suprep.  Patient was advised and new instructions were mailed to him.

## 2023-06-29 NOTE — Telephone Encounter (Signed)
 Inbound call from [facility] pharmacy stating they have not received Moviprep and only Suprep. Please advise, thank you

## 2023-06-30 ENCOUNTER — Other Ambulatory Visit: Payer: Self-pay

## 2023-06-30 MED ORDER — PEG-KCL-NACL-NASULF-NA ASC-C 100 G PO SOLR: 1.0000 | Freq: Once | ORAL | 0 refills | Status: AC

## 2023-06-30 NOTE — Telephone Encounter (Signed)
MoviPrep resent to pharmacy

## 2023-06-30 NOTE — Addendum Note (Signed)
 Addended by: Orlene Bjork on: 06/30/2023 09:54 AM   Modules accepted: Orders

## 2023-07-08 ENCOUNTER — Telehealth (HOSPITAL_BASED_OUTPATIENT_CLINIC_OR_DEPARTMENT_OTHER): Payer: Self-pay | Admitting: Licensed Clinical Social Worker

## 2023-07-08 NOTE — Telephone Encounter (Signed)
 I spoke to Larry Richmond and he confirmed that he received the new instructions that I mailed and he received the prep from the [place].  I advised him to call if he has any questions.

## 2023-07-08 NOTE — Telephone Encounter (Signed)
 H&V Care Navigation CSW Progress Note  Clinical Social Worker contacted patient by phone to f/u on patient assistance offer for assistance to free up funds for copay of calcium  score. Was able to reach pt this morning at [phone]. He shares he still plans to speak with family about it. Reminded him of assistance options and encouraged him to contact us  about those as needed.  Patient is participating in a Managed Medicaid Plan:  No, VA and Medicare part A  SDOH Screenings   Food Insecurity: No Food Insecurity (06/24/2023)  Housing: Low Risk  (06/24/2023)  Transportation Needs: No Transportation Needs (06/24/2023)  Utilities: Not At Risk (06/24/2023)  Financial Resource Strain: Medium Risk (06/24/2023)  Social Connections: Unknown (05/26/2022)   Received from [facility], [facility]  Tobacco Use: High Risk (06/23/2023)  Health Literacy: Adequate Health Literacy (06/24/2023)    Nathen Balder, MSW, LCSW Clinical Social Worker II [facility] Health Heart/Vascular Care Navigation  [phone]- work cell phone (preferred) [phone]- desk phone

## 2023-07-12 ENCOUNTER — Encounter: Payer: Self-pay | Admitting: Pediatrics

## 2023-07-12 NOTE — Progress Notes (Unsigned)
 [facility] History and Physical   Primary Care Physician:  Clinic, [facility]   Reason for Procedure:  Dysphagia, history of LA grade C esophagitis, hiatal hernia, adenomatous colon polyps, rectal bleeding and constipation  Plan:    EGD and colonoscopy     HPI: Larry Richmond is a 73 y.o. male undergoing EGD for investigation of dysphagia, history of LA grade C esophagitis and hiatal hernia.  At the time of last clinic visit described a sensation of food getting stuck in his esophagus.  Protonix  was increased to 40 mg p.o. twice daily with plan for EGD today.  Colonoscopy in 2022 disclosed 13 polyps-9 were tubular adenomas.  Patient has also endorsed some intermittent bright red blood per rectum and constipation.  No family history of colorectal cancer, polyps or GI foregut tumors.   Past Medical History:  Diagnosis Date   Anxiety    Arthritis    Benign essential HTN 01/13/2019   Blood transfusion without reported diagnosis    Clotting disorder (HCC)    Colon polyps    Depression    Diabetes mellitus without complication (HCC)    GERD (gastroesophageal reflux disease)    HTN (hypertension)    Hyperlipidemia    Obesity    Pneumonia    Pulmonary embolism (HCC)    Sleep apnea    Thyroid  disease    Tubular adenoma of colon 2017    Past Surgical History:  Procedure Laterality Date   COLONOSCOPY     lipoma cyst     removed from back   THYROID  SURGERY      Prior to Admission medications   Medication Sig Start Date End Date Taking? Authorizing Provider  acetaminophen  (TYLENOL ) 500 MG tablet Take 1,000 mg by mouth 3 (three) times daily as needed. 12/03/22   [provider]  albuterol  (VENTOLIN  HFA) 108 (90 Base) MCG/ACT inhaler Inhale 2 puffs into the lungs in the morning, at noon, in the evening, and at bedtime.    [provider]  atorvastatin  (LIPITOR) 80 MG tablet Take 80 mg by mouth daily. 06/01/23   [provider]  B  Complex Vitamins (VITAMIN B COMPLEX) CAPS Take 2 capsules by mouth daily. 06/01/23   [provider]  buPROPion (WELLBUTRIN XL) 300 MG 24 hr tablet Take 300 mg by mouth daily. 11/22/22   [provider]  Cholecalciferol 25 MCG (1000 UT) TBDP Take 1 tablet by mouth daily. 11/22/22   [provider]  cyclobenzaprine  (FLEXERIL ) 10 MG tablet Take 1 tablet (10 mg total) by mouth 2 (two) times daily as needed for muscle spasms. 05/07/23   Almond Army, MD  DULoxetine (CYMBALTA) 30 MG capsule Take 90 mg by mouth daily. 11/22/22   [provider]  Evolocumab  (REPATHA  SURECLICK) 140 MG/ML SOAJ Inject 140 mg into the skin every 14 (fourteen) days. 06/24/23   Avanell Leigh, MD  famotidine  (PEPCID ) 40 MG tablet Take 1 tablet (40 mg total) by mouth daily. 06/04/21   Asencion Blacksmith, MD  folic acid (FOLVITE) 1 MG tablet Take 1 mg by mouth daily. 12/27/22   [provider]  furosemide  (LASIX ) 20 MG tablet Take 20 mg by mouth 2 (two) times daily. 06/01/23   [provider]  HYDROcodone -acetaminophen  (NORCO/VICODIN) 5-325 MG tablet Take 1 tablet by mouth every 6 (six) hours as needed. 05/07/23   Almond Army, MD  levothyroxine  (SYNTHROID ) 100 MCG tablet Take 100 mcg by mouth daily before breakfast.    [provider]  lidocaine (LIDODERM) 5 % Place 1 patch onto the skin every 12 (twelve) hours. 06/01/23   [provider]  lisinopril  (ZESTRIL ) 2.5 MG tablet Take 1 tablet by mouth daily. 06/01/23   [provider]  metFORMIN (GLUCOPHAGE-XR) 500 MG 24 hr tablet Take 500 mg by mouth daily with breakfast. 06/01/23   [provider]  Glynda Lash Calcium  500 MG TABS Take 1 tablet by mouth 3 (three) times daily. 07/08/16   [provider]  pantoprazole  (PROTONIX ) 40 MG tablet Take 40 mg by mouth 2 (two) times daily before a meal.    [provider]  potassium chloride SA (KLOR-CON M) 20 MEQ tablet Take 20 mEq by mouth  2 (two) times daily. 06/01/23   [provider]  prazosin (MINIPRESS) 2 MG capsule Take 2 mg by mouth at bedtime. 11/22/22   [provider]  pregabalin (LYRICA) 150 MG capsule Take 150 mg by mouth 2 (two) times daily. 06/01/23   [provider]  sildenafil (VIAGRA) 100 MG tablet Take 50 mg by mouth as needed. 06/01/23   [provider]  silodosin (RAPAFLO) 8 MG CAPS capsule Take 8 mg by mouth daily. 02/15/23   [provider]    Current Outpatient Medications  Medication Sig Dispense Refill   atorvastatin  (LIPITOR) 80 MG tablet Take 80 mg by mouth daily.     B Complex Vitamins (VITAMIN B COMPLEX) CAPS Take 2 capsules by mouth daily.     buPROPion (WELLBUTRIN XL) 300 MG 24 hr tablet Take 300 mg by mouth daily.     Cholecalciferol 25 MCG (1000 UT) TBDP Take 1 tablet by mouth daily.     cyclobenzaprine  (FLEXERIL ) 10 MG tablet Take 1 tablet (10 mg total) by mouth 2 (two) times daily as needed for muscle spasms. 20 tablet 0   DULoxetine (CYMBALTA) 30 MG capsule Take 90 mg by mouth daily.     furosemide  (LASIX ) 20 MG tablet Take 20 mg by mouth 2 (two) times daily.     levothyroxine  (SYNTHROID ) 100 MCG tablet Take 100 mcg by mouth daily before breakfast.     metFORMIN (GLUCOPHAGE-XR) 500 MG 24 hr tablet Take 500 mg by mouth daily with breakfast.     Oyster Shell Calcium  500 MG TABS Take 1 tablet by mouth 3 (three) times daily.     pantoprazole  (PROTONIX ) 40 MG tablet Take 40 mg by mouth 2 (two) times daily before a meal.     pregabalin (LYRICA) 150 MG capsule Take 150 mg by mouth 2 (two) times daily.     acetaminophen  (TYLENOL ) 500 MG tablet Take 1,000 mg by mouth 3 (three) times daily as needed.     albuterol  (VENTOLIN  HFA) 108 (90 Base) MCG/ACT inhaler Inhale 2 puffs into the lungs in the morning, at noon, in the evening, and at bedtime.     Evolocumab  (REPATHA  SURECLICK) 140 MG/ML SOAJ Inject 140 mg into the skin every 14 (fourteen) days. 6 mL 3    famotidine  (PEPCID ) 40 MG tablet Take 1 tablet (40 mg total) by mouth daily. 30 tablet 1   folic acid (FOLVITE) 1 MG tablet Take 1 mg by mouth daily.     HYDROcodone -acetaminophen  (NORCO/VICODIN) 5-325 MG tablet Take 1 tablet by mouth every 6 (six) hours as needed. 12 tablet 0   lidocaine (LIDODERM) 5 % Place 1 patch onto the skin every 12 (twelve) hours.     lisinopril  (ZESTRIL ) 2.5 MG tablet Take 1 tablet by mouth daily.  prazosin (MINIPRESS) 2 MG capsule Take 2 mg by mouth at bedtime.     sildenafil (VIAGRA) 100 MG tablet Take 50 mg by mouth as needed.     silodosin (RAPAFLO) 8 MG CAPS capsule Take 8 mg by mouth daily.     Current Facility-Administered Medications  Medication Dose Route Frequency Provider Last Rate Last Admin   0.9 %  sodium chloride  infusion  500 mL Intravenous Once Kailen Hinkle, Scarlette Currier, MD       peg 3350 powder (MOVIPREP) kit 200 g  1 kit Oral Once Edmonia Gottron, PA-C        Allergies as of 07/13/2023   (No Known Allergies)    Family History  Problem Relation Age of Onset   Alzheimer's disease Mother    Cancer Father        type unknown   Kidney disease Father    Colon cancer Neg Hx    Rectal cancer Neg Hx    Stomach cancer Neg Hx     Social History   Socioeconomic History   Marital status: Significant Other    Spouse name: Not on file   Number of children: 3   Years of education: Not on file   Highest education level: Not on file  Occupational History   Not on file  Tobacco Use   Smoking status: Some Days    Types: Cigarettes   Smokeless tobacco: Never   Tobacco comments:    on occasions  Vaping Use   Vaping status: Never Used  Substance and Sexual Activity   Alcohol use: Yes    Comment: occasional   Drug use: Yes    Types: Marijuana    Comment: 05/28/2023   Sexual activity: Not on file  Other Topics Concern   Not on file  Social History Narrative   Not on file   Social Drivers of Health   Financial Resource Strain: Medium Risk  (06/24/2023)   Overall Financial Resource Strain (CARDIA)    Difficulty of Paying Living Expenses: Somewhat hard  Food Insecurity: No Food Insecurity (06/24/2023)   Hunger Vital Sign    Worried About Running Out of Food in the Last Year: Never true    Ran Out of Food in the Last Year: Never true  Transportation Needs: No Transportation Needs (06/24/2023)   PRAPARE - Administrator, Civil Service (Medical): No    Lack of Transportation (Non-Medical): No  Physical Activity: Not on file  Stress: Not on file  Social Connections: Unknown (05/26/2022)   Received from [facility], [facility]   Social Network    Social Network: Not on file  Intimate Partner Violence: Unknown (05/26/2022)   Received from [facility], [facility]   HITS    Physically Hurt: Not on file    Insult or Talk Down To: Not on file    Threaten Physical Harm: Not on file    Scream or Curse: Not on file    Review of Systems:  All other review of systems negative except as mentioned in the HPI.  Physical Exam: Vital signs BP 108/76   Pulse 60   Temp 98.4 F (36.9 C) (Temporal)   Resp 12   Ht 6' (1.829 m)   Wt (!) 365 lb (165.6 kg)   SpO2 97%   BMI 49.50 kg/m   General:   Alert,  Well-developed, well-nourished, pleasant and cooperative in NAD Airway:  Mallampati 3 Lungs:  Clear throughout to auscultation.   Heart:  Regular rate and rhythm; no murmurs, clicks, rubs,  or gallops. Abdomen:  Soft, nontender and nondistended. Normal bowel sounds.   Neuro/Psych:  Normal mood and affect. A and O x 3  Eugenia Hess, MD [facility]

## 2023-07-13 ENCOUNTER — Ambulatory Visit: Admitting: Pediatrics

## 2023-07-13 ENCOUNTER — Encounter: Payer: Self-pay | Admitting: Pediatrics

## 2023-07-13 VITALS — BP 152/91 | HR 51 | Temp 98.4°F | Resp 13 | Ht 72.0 in | Wt 365.0 lb

## 2023-07-13 DIAGNOSIS — Z860101 Personal history of adenomatous and serrated colon polyps: Secondary | ICD-10-CM

## 2023-07-13 DIAGNOSIS — K648 Other hemorrhoids: Secondary | ICD-10-CM | POA: Diagnosis not present

## 2023-07-13 DIAGNOSIS — D124 Benign neoplasm of descending colon: Secondary | ICD-10-CM

## 2023-07-13 DIAGNOSIS — K2970 Gastritis, unspecified, without bleeding: Secondary | ICD-10-CM

## 2023-07-13 DIAGNOSIS — K2100 Gastro-esophageal reflux disease with esophagitis, without bleeding: Secondary | ICD-10-CM | POA: Diagnosis not present

## 2023-07-13 DIAGNOSIS — Z1211 Encounter for screening for malignant neoplasm of colon: Secondary | ICD-10-CM | POA: Diagnosis present

## 2023-07-13 DIAGNOSIS — D123 Benign neoplasm of transverse colon: Secondary | ICD-10-CM

## 2023-07-13 DIAGNOSIS — K625 Hemorrhage of anus and rectum: Secondary | ICD-10-CM | POA: Diagnosis not present

## 2023-07-13 DIAGNOSIS — K449 Diaphragmatic hernia without obstruction or gangrene: Secondary | ICD-10-CM | POA: Diagnosis not present

## 2023-07-13 DIAGNOSIS — K2950 Unspecified chronic gastritis without bleeding: Secondary | ICD-10-CM

## 2023-07-13 DIAGNOSIS — K635 Polyp of colon: Secondary | ICD-10-CM

## 2023-07-13 DIAGNOSIS — K219 Gastro-esophageal reflux disease without esophagitis: Secondary | ICD-10-CM

## 2023-07-13 DIAGNOSIS — K5730 Diverticulosis of large intestine without perforation or abscess without bleeding: Secondary | ICD-10-CM

## 2023-07-13 DIAGNOSIS — D128 Benign neoplasm of rectum: Secondary | ICD-10-CM | POA: Diagnosis not present

## 2023-07-13 DIAGNOSIS — R1310 Dysphagia, unspecified: Secondary | ICD-10-CM

## 2023-07-13 DIAGNOSIS — K209 Esophagitis, unspecified without bleeding: Secondary | ICD-10-CM

## 2023-07-13 DIAGNOSIS — K5900 Constipation, unspecified: Secondary | ICD-10-CM | POA: Diagnosis not present

## 2023-07-13 MED ORDER — SODIUM CHLORIDE 0.9 % IV SOLN: 500.0000 mL | Freq: Once | INTRAVENOUS | Status: AC

## 2023-07-13 NOTE — Progress Notes (Signed)
 Called to room to assist during endoscopic procedure.  Patient ID and intended procedure confirmed with present staff. Received instructions for my participation in the procedure from the performing physician.

## 2023-07-13 NOTE — Op Note (Signed)
 [facility] Patient Name: Larry Richmond Procedure Date: 07/13/2023 1:09 PM MRN: [id number] Endoscopist: Eugenia Hess , MD, [id number] Age: 73 Referring MD:  Date of Birth: Nov 17, 1950 Gender: Male Account #: [id number] Procedure:                Upper GI endoscopy Indications:              Dysphagia, Follow-up of gastro-esophageal reflux                            disease, For therapy of reflux esophagitis Medicines:                Monitored Anesthesia Care Procedure:                Pre-Anesthesia Assessment:                           - Prior to the procedure, a History and Physical                            was performed, and patient medications and                            allergies were reviewed. The patient's tolerance of                            previous anesthesia was also reviewed. The risks                            and benefits of the procedure and the sedation                            options and risks were discussed with the patient.                            All questions were answered, and informed consent                            was obtained. Prior Anticoagulants: The patient has                            taken no anticoagulant or antiplatelet agents. ASA                            Grade Assessment: III - A patient with severe                            systemic disease. After reviewing the risks and                            benefits, the patient was deemed in satisfactory                            condition to undergo the procedure.                           -  Prior to the procedure, a History and Physical                            was performed, and patient medications and                            allergies were reviewed. The patient's tolerance of                            previous anesthesia was also reviewed. The risks                            and benefits of the procedure and the sedation                            options and risks were  discussed with the patient.                            All questions were answered, and informed consent                            was obtained. Prior Anticoagulants: The patient has                            taken no anticoagulant or antiplatelet agents. ASA                            Grade Assessment: III - A patient with severe                            systemic disease. After reviewing the risks and                            benefits, the patient was deemed in satisfactory                            condition to undergo the procedure.                           After obtaining informed consent, the endoscope was                            passed under direct vision. Throughout the                            procedure, the patient's blood pressure, pulse, and                            oxygen saturations were monitored continuously. The                            GIF [id number] #[id number] was introduced through the  mouth, and advanced to the second part of duodenum.                            The upper GI endoscopy was accomplished without                            difficulty. The patient tolerated the procedure. Scope In: Scope Out: Findings:                 The upper third of the esophagus and middle third                            of the esophagus were normal.                           LA Grade C (one or more mucosal breaks continuous                            between tops of 2 or more mucosal folds, less than                            75% circumference) esophagitis was found.                           Localized mild inflammation characterized by                            erythema was found in the gastric antrum and in the                            prepyloric region of the stomach. Biopsies were                            taken with a cold forceps for Helicobacter pylori                            testing.                           The gastric body,  cardia (on retroflexion) and                            gastric fundus (on retroflexion) were normal.                            Biopsies were taken with a cold forceps for                            Helicobacter pylori testing.                           A medium-sized hiatal hernia was present.                           The duodenal bulb  and second portion of the                            duodenum were normal. Complications:            No immediate complications. Estimated blood loss:                            Minimal. Estimated Blood Loss:     Estimated blood loss was minimal. Impression:               - Normal upper third of esophagus and middle third                            of esophagus.                           - LA Grade C erosive esophagitis.                           - Gastritis. Biopsied.                           - Normal gastric body, cardia and gastric fundus.                            Biopsied.                           - Medium-sized hiatal hernia.                           - Normal duodenal bulb and second portion of the                            duodenum. Recommendation:           - Discharge patient to home (ambulatory).                           - Await pathology results.                           - Continue Protonix  40 mg p.o. twice daily -will                            review patient's compliance. If he was taking PPI                            twice daily compliantly before this procedure will                            add in H2 blocker.                           - GERD precautions                           -  Patient will need a follow-up EGD in 3 to 6                            months to reassess esophagitis. Recommend that this                            be performed in a hospital-based unit given body                            habitus and challenging airway. Eugenia Hess, MD 07/13/2023 3:20:12 PM This report has been signed electronically.

## 2023-07-13 NOTE — Progress Notes (Signed)
 Vss nad trans to pacu

## 2023-07-13 NOTE — Progress Notes (Signed)
 Vitals-Larry Richmond  Pt's states no medical or surgical changes since previsit or office visit.  Dr Timmothy Foots the patient fpr the procedure.

## 2023-07-13 NOTE — Patient Instructions (Addendum)
 Resume previous diet and medications.  As ordered, please take protonix  40mg  by mouth twice daily.  Our office will contact you for repeat upper endoscopy in 3-6 months.  If you do not hear from us , please contact our office directly.  Education materials attached.   YOU HAD AN ENDOSCOPIC PROCEDURE TODAY AT THE [facility]:   Refer to the procedure report that was given to you for any specific questions about what was found during the examination.  If the procedure report does not answer your questions, please call your gastroenterologist to clarify.  If you requested that your care partner not be given the details of your procedure findings, then the procedure report has been included in a sealed envelope for you to review at your convenience later.  YOU SHOULD EXPECT: Some feelings of bloating in the abdomen. Passage of more gas than usual.  Walking can help get rid of the air that was put into your GI tract during the procedure and reduce the bloating. If you had a lower endoscopy (such as a colonoscopy or flexible sigmoidoscopy) you may notice spotting of blood in your stool or on the toilet paper. If you underwent a bowel prep for your procedure, you may not have a normal bowel movement for a few days.  Please Note:  You might notice some irritation and congestion in your nose or some drainage.  This is from the oxygen used during your procedure.  There is no need for concern and it should clear up in a day or so.  SYMPTOMS TO REPORT IMMEDIATELY:  Following lower endoscopy (colonoscopy or flexible sigmoidoscopy):  Excessive amounts of blood in the stool  Significant tenderness or worsening of abdominal pains  Swelling of the abdomen that is new, acute  Fever of 100F or higher  Following upper endoscopy (EGD)  Vomiting of blood or coffee ground material  New chest pain or pain under the shoulder blades  Painful or persistently difficult swallowing  New shortness of  breath  Fever of 100F or higher  Black, tarry-looking stools  For urgent or emergent issues, a gastroenterologist can be reached at any hour by calling [phone]. Do not use MyChart messaging for urgent concerns.    DIET:  We do recommend a small meal at first, but then you may proceed to your regular diet.  Drink plenty of fluids but you should avoid alcoholic beverages for 24 hours.  ACTIVITY:  You should plan to take it easy for the rest of today and you should NOT DRIVE or use heavy machinery until tomorrow (because of the sedation medicines used during the test).    FOLLOW UP: Our staff will call the number listed on your records the next business day following your procedure.  We will call around 7:15- 8:00 am to check on you and address any questions or concerns that you may have regarding the information given to you following your procedure. If we do not reach you, we will leave a message.     If any biopsies were taken you will be contacted by phone or by letter within the next 1-3 weeks.  Please call us  at [phone] if you have not heard about the biopsies in 3 weeks.    SIGNATURES/CONFIDENTIALITY: You and/or your care partner have signed paperwork which will be entered into your electronic medical record.  These signatures attest to the fact that that the information above on your After Visit Summary has been reviewed and is  understood.  Full responsibility of the confidentiality of this discharge information lies with you and/or your care-partner.

## 2023-07-13 NOTE — Op Note (Addendum)
 [facility] Patient Name: Larry Richmond Procedure Date: 07/13/2023 1:08 PM MRN: [id number] Endoscopist: Eugenia Hess , MD, [id number] Age: 73 Referring MD:  Date of Birth: [birth year] Gender: Male Account #: [id number] Procedure:                Colonoscopy Indications:              High risk colon cancer surveillance: Personal                            history of multiple (3 or more) adenomas, Last                            colonoscopy: January 2022 Medicines:                Monitored Anesthesia Care Procedure:                Pre-Anesthesia Assessment:                           - Prior to the procedure, a History and Physical                            was performed, and patient medications and                            allergies were reviewed. The patient's tolerance of                            previous anesthesia was also reviewed. The risks                            and benefits of the procedure and the sedation                            options and risks were discussed with the patient.                            All questions were answered, and informed consent                            was obtained. Prior Anticoagulants: The patient has                            taken no anticoagulant or antiplatelet agents. ASA                            Grade Assessment: III - A patient with severe                            systemic disease. After reviewing the risks and                            benefits, the patient was deemed in satisfactory  condition to undergo the procedure.                           After obtaining informed consent, the colonoscope                            was passed under direct vision. Throughout the                            procedure, the patient's blood pressure, pulse, and                            oxygen saturations were monitored continuously. The                            Olympus Scope SN: [id number] was introduced  through                            the anus and advanced to the cecum, identified by                            appendiceal orifice and ileocecal valve. The                            colonoscopy was generally performed without                            difficulty. The patient's respiratory variation and                            body habitus made polyp removal challenging at                            times. The patient tolerated the procedure well.                            The quality of the bowel preparation was good. The                            ileocecal valve, appendiceal orifice, and rectum                            were photographed. Scope In: 2:22:08 PM Scope Out: 2:54:25 PM Scope Withdrawal Time: 0 hours 25 minutes 36 seconds  Total Procedure Duration: 0 hours 32 minutes 17 seconds  Findings:                 The perianal and digital rectal examinations were                            normal. Pertinent negatives include normal                            sphincter tone and no palpable rectal lesions.  A few small-mouthed diverticula were found in the                            sigmoid colon, transverse colon and ascending colon.                           Three sessile polyps were found in the rectum,                            descending colon and transverse colon. The polyps                            were 4 to 7 mm in size. These polyps were removed                            with a cold snare. Resection and retrieval were                            complete.                           A 3 mm polyp was found in the descending colon. The                            polyp was sessile. The polyp was removed with a                            cold biopsy forceps. Resection and retrieval were                            complete.                           Scattered diminutive polyps were seen in the rectum                            consistent with  hyperplastic polyps and therefore                            not removed.                           Internal hemorrhoids were found during retroflexion. Complications:            No immediate complications. Estimated blood loss:                            Minimal. Estimated Blood Loss:     Estimated blood loss was minimal. Impression:               - Diverticulosis in the sigmoid colon, in the                            transverse colon and in the ascending colon.                           -  Three 4 to 7 mm polyps in the rectum, in the                            descending colon and in the transverse colon,                            removed with a cold snare. Resected and retrieved.                           - One 3 mm polyp in the descending colon, removed                            with a cold biopsy forceps. Resected and retrieved. Recommendation:           - Await pathology results.                           - Perform an upper GI endoscopy today.                           - The findings and recommendations were discussed                            with the designated responsible adult.                           - Patient tolerated today's procedure but given                            body habitus and challenging airway recommend the                            future procedures to be performed in hospital-based                            setting. Eugenia Hess, MD 07/13/2023 3:12:08 PM This report has been signed electronically.

## 2023-07-14 ENCOUNTER — Telehealth: Payer: Self-pay

## 2023-07-14 NOTE — Telephone Encounter (Signed)
 Follow up call to pt, lm for pt to call if having any difficulty with normal activities or eating and drinking.  Also to call if any other questions or concerns.

## 2023-07-18 LAB — SURGICAL PATHOLOGY

## 2023-07-19 ENCOUNTER — Encounter: Payer: Self-pay | Admitting: Pediatrics

## 2023-12-16 NOTE — Progress Notes (Addendum)
 12/19/2023 Larry Richmond [id number] 1951-01-06   Referring provider: Silva Bernardino LABOR, PA-C Primary GI doctor: Dr. Suzann  ASSESSMENT AND PLAN:   GERD with history of esophagitis, 5 cm hiatal hernia Endoscopy 2023 LA grade C esophagitis otherwise normal medium size hiatal hernia 5 [place] grade 3 duodenitis Pathology unremarkable. 07/13/2023 EGD LA grade C erosive esophagitis, gastritis medium size hiatal hernia normal duodenum, negative H. Pylori -Started on pantoprazole  40 mg twice daily which has helped, refilled today -Need repeat EGD 3 to 6 months in the hospital setting, will set up this visit to evaluate esophagitis, I discussed risks of EGD with patient today, including risk of sedation, bleeding or perforation.  Patient provides understanding and gave verbal consent to proceed. 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 Gastroparesis diet given to the patient.  Patient should be instructed to hold this medications if dose falls within 7 days of endoscopic procedure, due to increased risk of retained gastric contents.  Morbid obesity  Body mass index is 49.77 kg/m.  -Patient has been advised to make an attempt to improve diet and exercise patterns to aid in weight loss. -Recommended diet heavy in fruits and veggies and low in animal meats, cheeses, and dairy products, appropriate calorie intake  History of  colon polyps Colonoscopy 2022 with Dr. Aneita good bowel prep difficult due to patient's body habitus and respiratory motion showed 13 polyps 6 to 10 mm sigmoid descending transverse descending cecum, ascending colon lipoma, diverticulosis left colon, internal hemorrhoids, recall 2 years 07/13/2023 colonoscopy Dr. Suzann 2-day prep diverticulosis, 3 polyps 4 to 7 mm rectum descending transverse colon, 3 mm polyp descending colon difficult procedure due to body habitus and challenging airway, TA polyps without dysplasia, HP recall colonoscopy 3 years hospital-based setting  I have reviewed the clinic note as outlined by Alan Blake, PA and agree with the assessment, plan and medical decision making.  Larry Richmond returns to the office status post EGD and colonoscopy.  EGD showed LA grade C esophagitis, medium size hiatal hernia and gastritis without H. pylori.  Advised to continue on twice daily PPI and repeat EGD in 3 to 6 months for confirmation of healing of esophagitis.  Patient had 4 polyps on colonoscopy-3 of these were tubular adenomas and 1 hyperplastic.  Advised a 3-year follow-up in a hospital-based setting.  Noted to have a history of fatty liver.  Starting on GLP-1 medication which may ameliorate hepatic steatosis.  Will continue to monitor liver enzymes and periodic liver imaging.  Noted that MRI confirmed previous liver lesion as hemangioma.  Inocente Suzann, MD   Patient Care Team: Clinic, [facility] as PCP - [facility] Dorn PARAS, MD as Consulting Physician (Cardiology)  HISTORY OF PRESENT ILLNESS: 73 y.o. male with a past medical history listed below presents for evaluation for colonoscopy and evaluation of GERD from the [facility].  I last saw the patient in the office March 2025  for reflux and evaluation of colonoscopy.  Patient had colonoscopy 07/13/2023 and MR abdomen with without contrast/12/2023 showed hepatomegaly and hemangioma  Discussed the use of AI scribe software  for clinical note transcription with the patient, who gave verbal consent to proceed.  History of Present Illness          He  reports that he has been smoking cigarettes. He has never used smokeless tobacco. He reports current alcohol use. He reports current drug use. Drug: Marijuana.  RELEVANT GI HISTORY, IMAGING AND LABS: Results         07/13/2023 colonoscopy - Diverticulosis in the sigmoid colon, in the transverse colon and in the ascending colon. - Three 4 to 7 mm polyps in the rectum, in the descending colon and in the transverse colon, removed with a cold snare. Resected and retrieved. - One 3 mm polyp in the descending colon, removed with a cold biopsy forceps. Resected and retrieved.  07/13/2023 EGD - Normal upper third of esophagus and middle third of esophagus. - LA Grade C erosive esophagitis. - Gastritis. Biopsied. - Normal gastric body, cardia and gastric fundus. Biopsied. - Medium- sized hiatal hernia. - Normal duodenal bulb and second portion of the duodenum.  CBC    Component Value Date/Time   WBC 5.8 05/06/2023 1736   RBC 4.73 05/06/2023 1736   HGB 14.2 05/06/2023 1736   HCT 44.8 05/06/2023 1736   PLT 208 05/06/2023 1736   MCV 94.7 05/06/2023 1736   MCH 30.0 05/06/2023 1736   MCHC 31.7 05/06/2023 1736   RDW 17.3 (H) 05/06/2023 1736   LYMPHSABS 1.1 01/01/2023 0828   MONOABS 0.3 01/01/2023 0828   EOSABS 0.1 01/01/2023 0828   BASOSABS 0.0 01/01/2023 0828   Recent Labs    01/01/23 0828 05/06/23 1736  HGB 14.7 14.2    CMP     Component Value Date/Time   NA 137 05/06/2023 1736   K 4.5 05/06/2023 1736   CL 105 05/06/2023 1736   CO2 24 05/06/2023 1736   GLUCOSE 96 05/06/2023 1736   BUN 13 05/06/2023 1736   CREATININE 1.36 (H) 05/06/2023 1736   CALCIUM  9.1 05/06/2023 1736   PROT 7.5 05/06/2023 1736   PROT 7.0 02/28/2023 1010   ALBUMIN 3.5 05/06/2023 1736   ALBUMIN 3.8 02/28/2023 1010   AST 16 05/06/2023 1736   ALT 13 05/06/2023 1736   ALKPHOS 41  05/06/2023 1736   BILITOT 1.2 05/06/2023 1736   BILITOT 0.7 02/28/2023 1010   GFRNONAA 55 (L) 05/06/2023 1736   GFRAA >60 01/17/2019 0425      Latest Ref Rng & Units 05/06/2023    5:36 PM 02/28/2023   10:10 AM 11/04/2020    9:11 PM  Hepatic Function  Total Protein 6.5 - 8.1 g/dL 7.5  7.0  7.4   Albumin 3.5 - 5.0 g/dL 3.5  3.8  3.6   AST 15 - 41 U/L 16  17  21    ALT 0 - 44 U/L 13  17  22    Alk Phosphatase 38 - 126 U/L 41  55  49   Total Bilirubin 0.0 - 1.2 mg/dL 1.2  0.7  1.3   Bilirubin, Direct 0.00 - 0.40 mg/dL  9.76        Current Medications:   Current Outpatient Medications (Endocrine & Metabolic):    levothyroxine  (SYNTHROID ) 100 MCG tablet, Take 100 mcg by mouth daily before breakfast.   metFORMIN (GLUCOPHAGE-XR) 500 MG 24 hr tablet, Take 500 mg by mouth  daily with breakfast.   Current Outpatient Medications (Cardiovascular):    atorvastatin  (LIPITOR) 80 MG tablet, Take 80 mg by mouth daily.   Evolocumab  (REPATHA  SURECLICK) 140 MG/ML SOAJ, Inject 140 mg into the skin every 14 (fourteen) days.   furosemide  (LASIX ) 20 MG tablet, Take 20 mg by mouth 2 (two) times daily.   lisinopril  (ZESTRIL ) 2.5 MG tablet, Take 1 tablet by mouth daily.   prazosin (MINIPRESS) 2 MG capsule, Take 2 mg by mouth at bedtime.   sildenafil (VIAGRA) 100 MG tablet, Take 50 mg by mouth as needed.   Current Outpatient Medications (Respiratory):    albuterol  (VENTOLIN  HFA) 108 (90 Base) MCG/ACT inhaler, Inhale 2 puffs into the lungs in the morning, at noon, in the evening, and at bedtime.   Current Outpatient Medications (Analgesics):    acetaminophen  (TYLENOL ) 500 MG tablet, Take 1,000 mg by mouth 3 (three) times daily as needed.   HYDROcodone -acetaminophen  (NORCO/VICODIN) 5-325 MG tablet, Take 1 tablet by mouth every 6 (six) hours as needed.   Current Outpatient Medications (Hematological):    folic acid (FOLVITE) 1 MG tablet, Take 1 mg by mouth daily.   Current Outpatient Medications  (Other):    B Complex Vitamins (VITAMIN B COMPLEX) CAPS, Take 2 capsules by mouth daily.   buPROPion (WELLBUTRIN XL) 300 MG 24 hr tablet, Take 300 mg by mouth daily.   Cholecalciferol 25 MCG (1000 UT) TBDP, Take 1 tablet by mouth daily.   cyclobenzaprine  (FLEXERIL ) 10 MG tablet, Take 1 tablet (10 mg total) by mouth 2 (two) times daily as needed for muscle spasms.   DULoxetine (CYMBALTA) 30 MG capsule, Take 90 mg by mouth daily.   famotidine  (PEPCID ) 40 MG tablet, Take 1 tablet (40 mg total) by mouth daily.   lidocaine (LIDODERM) 5 %, Place 1 patch onto the skin every 12 (twelve) hours.   Oyster Shell Calcium  500 MG TABS, Take 1 tablet by mouth 3 (three) times daily.   pantoprazole  (PROTONIX ) 40 MG tablet, Take 40 mg by mouth 2 (two) times daily before a meal.   pregabalin (LYRICA) 150 MG capsule, Take 150 mg by mouth 2 (two) times daily.   silodosin (RAPAFLO) 8 MG CAPS capsule, Take 8 mg by mouth daily.  Current Facility-Administered Medications (Other):    0.9 %  sodium chloride  infusion   peg 3350 powder (MOVIPREP) kit 200 g  Medical History:  Past Medical History:  Diagnosis Date   Anxiety    Arthritis    Benign essential HTN 01/13/2019   Blood transfusion without reported diagnosis    Clotting disorder    Colon polyps    Depression    Diabetes mellitus without complication (HCC)    GERD (gastroesophageal reflux disease)    HTN (hypertension)    Hyperlipidemia    Obesity    Pneumonia    Pulmonary embolism (HCC)    Sleep apnea    Thyroid disease    Tubular adenoma of colon 2017   Allergies: No Known Allergies   Surgical History:  He  has a past surgical history that includes Thyroid surgery; lipoma cyst; and Colonoscopy. Family History:  His family history includes Alzheimer's disease in his mother; Cancer in his father; Kidney disease in his father.  REVIEW OF SYSTEMS  : All other systems reviewed and negative except where noted in the History of Present  Illness.  PHYSICAL EXAM: BP 136/68   Pulse 75   Ht 6' (1.829 m)   Wt (!) 367 lb (166.5 kg)  BMI 49.77 kg/m  Physical Exam   MEASUREMENTS: BMI- 49.5. GENERAL APPEARANCE: obese, in no apparent distress. HEENT: No cervical lymphadenopathy, unremarkable thyroid, sclerae anicteric, conjunctiva pink. RESPIRATORY: Respiratory effort normal, decreased breath sounds, no wheeze, BS equal bilateral without rales, rhonchi. CARDIO: RRR with no MRGs, peripheral pulses intact. ABDOMEN: Soft, non-distended, active bowel sounds in all 4 quadrants, non-tender to palpation, no rebound, no mass appreciated. RECTAL: Declines. MUSCULOSKELETAL: Full ROM, antalgic gait, able to get on table with minimal assistance, with non pitting edema. SKIN: Dry, intact without rashes or lesions. No jaundice. NEURO: Alert, oriented, no focal deficits. PSYCH: Cooperative, normal mood and affect.   Alan JONELLE Coombs, PA-C 3:00 PM

## 2023-12-19 ENCOUNTER — Encounter: Payer: Self-pay | Admitting: Physician Assistant

## 2023-12-19 ENCOUNTER — Ambulatory Visit (INDEPENDENT_AMBULATORY_CARE_PROVIDER_SITE_OTHER): Admitting: Physician Assistant

## 2023-12-19 VITALS — BP 136/68 | HR 75 | Ht 72.0 in | Wt 367.0 lb

## 2023-12-19 DIAGNOSIS — N189 Chronic kidney disease, unspecified: Secondary | ICD-10-CM

## 2023-12-19 DIAGNOSIS — K209 Esophagitis, unspecified without bleeding: Secondary | ICD-10-CM | POA: Diagnosis not present

## 2023-12-19 DIAGNOSIS — K760 Fatty (change of) liver, not elsewhere classified: Secondary | ICD-10-CM | POA: Diagnosis not present

## 2023-12-19 DIAGNOSIS — K449 Diaphragmatic hernia without obstruction or gangrene: Secondary | ICD-10-CM

## 2023-12-19 DIAGNOSIS — Z6841 Body Mass Index (BMI) 40.0 and over, adult: Secondary | ICD-10-CM

## 2023-12-19 DIAGNOSIS — Z860100 Personal history of colon polyps, unspecified: Secondary | ICD-10-CM

## 2023-12-19 DIAGNOSIS — K2970 Gastritis, unspecified, without bleeding: Secondary | ICD-10-CM | POA: Diagnosis not present

## 2023-12-19 DIAGNOSIS — E1122 Type 2 diabetes mellitus with diabetic chronic kidney disease: Secondary | ICD-10-CM

## 2023-12-19 MED ORDER — PANTOPRAZOLE SODIUM 40 MG PO TBEC: 40.0000 mg | DELAYED_RELEASE_TABLET | Freq: Two times a day (BID) | ORAL | 1 refills | Status: AC

## 2023-12-19 MED ORDER — FAMOTIDINE 40 MG PO TABS: 40.0000 mg | ORAL_TABLET | Freq: Every day | ORAL | 1 refills | Status: AC

## 2023-12-19 NOTE — Patient Instructions (Addendum)
 You have been scheduled for an endoscopy. Please follow written instructions given to you at your visit today.  If you use inhalers (even only as needed), please bring them with you on the day of your procedure.  If you take any of the following medications, they will need to be adjusted prior to your procedure:   DO NOT TAKE 7 DAYS PRIOR TO TEST- Trulicity (dulaglutide) Ozempic, Wegovy (semaglutide) Mounjaro (tirzepatide) Bydureon Bcise (exanatide extended release)  DO NOT TAKE 1 DAY PRIOR TO YOUR TEST Rybelsus (semaglutide) Adlyxin (lixisenatide) Victoza (liraglutide) Byetta (exanatide) ___________________________________________________________________________   Diverticulosis Diverticulosis is a condition that develops when small pouches (diverticula) form in the wall of the large intestine (colon). The colon is where water is absorbed and stool (feces) is formed. The pouches form when the inside layer of the colon pushes through weak spots in the outer layers of the colon. You may have a few pouches or many of them. The pouches usually do not cause problems unless they become inflamed or infected. When this happens, the condition is called diverticulitis- this is left lower quadrant pain, diarrhea, fever, chills, nausea or vomiting.  If this occurs please call the office or go to the hospital. Sometimes these patches without inflammation can also have painless bleeding associated with them, if this happens please call the office or go to the hospital. Preventing constipation and increasing fiber can help reduce diverticula and prevent complications. Even if you feel you have a high-fiber diet, suggest getting on Benefiber or Cirtracel 2 times daily.  Understanding Your Weekly GLP-1 Injection  THIS IS THE NEW ONCE A WEEK INJECTABLE YOU WILL START ON A helpful guide to managing common side effects  You are on a once-weekly injectable medication in the GLP-1 receptor agonist class.  These medications can be very effective for blood sugar control, weight loss, and heart protection, fatty liver or OSA, but they can also come with some side effects that are important to understand. The good news is: most side effects can be managed with a few adjustments.  1. Gastroparesis-Like Symptoms These medications slow down your stomach to help you feel full longer -- great for weight loss and blood sugar control, but they can sometimes cause symptoms that feel like gastroparesis (slow stomach emptying). Symptoms may include: -Feeling full quickly when eating -Nausea or vomiting -Bloating or abdominal discomfort -Worsening heartburn or reflux -Acid regurgitation -Stomach spasms or tightness What you can do: ??? Eat small, frequent meals (4-6 per day) ?? Drink fluids between meals, not during ?? Avoid high-fiber foods (like raw veggies or whole grains); cook your veggies well ?? Spread protein throughout the day (try [place] yogurt, eggs, soft meats, Glucerna, milk) ?? Choose soft foods you can mash with a fork ?? Switch to pured foods or liquids during flare-ups ?? Consider reading: Living Well with Gastroparesis by Camelia Bone ?? Downloadable Diet Guide: [facility] Gastroparesis Diet PDF  ?? Tip: Try following a gastroparesis-friendly diet on the day of your injection and the day after, when the medication's effect is strongest.  2. Constipation Since this medication slows down your digestive system, constipation is very common. Tips to help: ?? Drink plenty of water ???? Stay active with regular exercise ?? Add fiber-rich but gentle foods like kiwi ?? Try a low-dose magnesium supplement at night ?? Use MiraLAX (half to one capful daily) if constipation becomes more frequent, especially if your dose increases  If these strategies don't help, talk to your provider -- they  may recommend or prescribe other treatments.  3. When to Call the Doctor or Go to the  ER While rare, this medication can slightly increase your risk of serious conditions like: Pancreatitis (inflammation of the pancreas) Gallstones or gallbladder problems Watch for these signs and seek help if you experience: Severe abdominal pain (especially in the upper belly or that radiates to your back) Pain in the right upper side of your abdomen Nausea, vomiting, fever, or chills that don't go away ?? Call your provider or go to the ER if these occur.  4. Who Should NOT Take This Medication? This medication should be avoided if you have: A personal history of pancreatitis  A personal or family history of medullary thyroid cancer A condition called Multiple Endocrine Neoplasia Syndrome Type 2 (MEN2)  Final Note If the side effects are too bothersome, remember: Most symptoms will go away if you stop the medication. But many people tolerate it well after the first few weeks, especially with the right strategies in place.  VISIT SUMMARY:  Today, you came in for a follow-up visit to discuss your diverticulosis, colon polyps, gastroesophageal reflux disease (GERD), and other health concerns. We reviewed the results of your recent colonoscopy and discussed your current medications and future plans for weight loss and diabetes management.  YOUR PLAN:  -DIVERTICULOSIS OF COLON: Diverticulosis means there are small pouches in the colon wall. To prevent these from becoming inflamed, it's important to eat a diet high in fiber. We will provide you with information on dietary fiber.  -PERSONAL HISTORY OF REMOVAL OF PRECANCEROUS COLON POLYPS: You had four polyps removed during your colonoscopy, some of which were precancerous. There are no current polyps or colon cancer, but we recommend a repeat colonoscopy in three years to monitor your condition.  -GASTROESOPHAGEAL REFLUX DISEASE (GERD) WITH ESOPHAGITIS AND HIATAL HERNIA: GERD is a condition where stomach acid frequently flows back into the  esophagus, causing inflammation. You also have a hiatal hernia, which contributes to this issue. Continue taking pantoprazole  40 mg twice daily. We will schedule a repeat endoscopy and provide information on how the new GLP-1 injectable medication might affect your reflux.  -OBESITY: You are working on losing weight and plan to start a GLP-1 injectable medication, which can help with weight loss and diabetes management. We discussed the benefits of weight loss on your overall health. Please note that you should not take the GLP-1 injectable 7 days before your endoscopy.  -TYPE 2 DIABETES MELLITUS: Type 2 diabetes is a condition where your body does not use insulin  properly. We plan to start you on a GLP-1 injectable medication, which can help manage your diabetes and provide additional benefits like heart disease prevention and improvement of fatty liver.  -OBSTRUCTIVE SLEEP APNEA: Obstructive sleep apnea is a condition where your breathing stops and starts during sleep. Weight loss and the GLP-1 injectable medication may help improve this condition. We discussed the benefits of weight loss on sleep apnea.  INSTRUCTIONS:  Please schedule a repeat colonoscopy in three years and a repeat endoscopy in the hospital setting. Ensure that you do not take the GLP-1 injectable medication 7 days prior to your endoscopy.

## 2023-12-27 ENCOUNTER — Other Ambulatory Visit: Payer: Self-pay

## 2023-12-27 ENCOUNTER — Telehealth: Payer: Self-pay

## 2023-12-27 DIAGNOSIS — R160 Hepatomegaly, not elsewhere classified: Secondary | ICD-10-CM

## 2023-12-27 DIAGNOSIS — K760 Fatty (change of) liver, not elsewhere classified: Secondary | ICD-10-CM

## 2023-12-27 NOTE — Telephone Encounter (Signed)
-----   Message from Nurse Elspeth RAMAN sent at 06/27/2023 10:52 AM EDT ----- Personal reminder sent 06/27/2023:  Need to check LFT/CBC every 6 months Alan Coombs PA

## 2023-12-27 NOTE — Telephone Encounter (Signed)
 Reminder was received in EPIC.  Pt made aware.  Orders for labs placed in Epic. Pt made aware. Location to lab given to pt.  Reminder was placed in Epic to repeat Labs in 6 months.  Pt verbalized understanding with all questions answered.

## 2023-12-28 ENCOUNTER — Other Ambulatory Visit (INDEPENDENT_AMBULATORY_CARE_PROVIDER_SITE_OTHER): Payer: Self-pay

## 2023-12-28 ENCOUNTER — Ambulatory Visit: Payer: Self-pay | Admitting: Physician Assistant

## 2023-12-28 DIAGNOSIS — R160 Hepatomegaly, not elsewhere classified: Secondary | ICD-10-CM

## 2023-12-28 DIAGNOSIS — K760 Fatty (change of) liver, not elsewhere classified: Secondary | ICD-10-CM

## 2023-12-28 LAB — CBC WITH DIFFERENTIAL/PLATELET
Basophils Absolute: 0 K/uL (ref 0.0–0.1)
Basophils Relative: 1 % (ref 0.0–3.0)
Eosinophils Absolute: 0 K/uL (ref 0.0–0.7)
Eosinophils Relative: 1 % (ref 0.0–5.0)
HCT: 43.7 % (ref 39.0–52.0)
Hemoglobin: 14 g/dL (ref 13.0–17.0)
Lymphocytes Relative: 29.2 % (ref 12.0–46.0)
Lymphs Abs: 1.4 K/uL (ref 0.7–4.0)
MCHC: 32.1 g/dL (ref 30.0–36.0)
MCV: 92.7 fl (ref 78.0–100.0)
Monocytes Absolute: 0.3 K/uL (ref 0.1–1.0)
Monocytes Relative: 5.4 % (ref 3.0–12.0)
Neutro Abs: 3 K/uL (ref 1.4–7.7)
Neutrophils Relative %: 63.4 % (ref 43.0–77.0)
Platelets: 157 K/uL (ref 150.0–400.0)
RBC: 4.71 Mil/uL (ref 4.22–5.81)
RDW: 16.2 % — ABNORMAL HIGH (ref 11.5–15.5)
WBC: 4.7 K/uL (ref 4.0–10.5)

## 2023-12-28 LAB — HEPATIC FUNCTION PANEL
ALT: 14 U/L (ref 0–53)
AST: 15 U/L (ref 0–37)
Albumin: 4 g/dL (ref 3.5–5.2)
Alkaline Phosphatase: 56 U/L (ref 39–117)
Bilirubin, Direct: 0.6 mg/dL — ABNORMAL HIGH (ref 0.0–0.3)
Total Bilirubin: 1 mg/dL (ref 0.2–1.2)
Total Protein: 7.5 g/dL (ref 6.0–8.3)

## 2024-01-11 ENCOUNTER — Other Ambulatory Visit: Payer: Self-pay

## 2024-01-11 DIAGNOSIS — R17 Unspecified jaundice: Secondary | ICD-10-CM

## 2024-01-11 NOTE — Telephone Encounter (Signed)
 Pt made aware of recent results and Alan Coombs PA recommendations. Order for lab placed in epic. Pt notified to return to our lab in tow weeks. Location to lab provided.  Pt verbalized understanding with all questions answered.

## 2024-01-12 ENCOUNTER — Other Ambulatory Visit

## 2024-01-12 DIAGNOSIS — R17 Unspecified jaundice: Secondary | ICD-10-CM

## 2024-01-13 ENCOUNTER — Ambulatory Visit: Payer: Self-pay | Admitting: Physician Assistant

## 2024-01-13 LAB — HEPATIC FUNCTION PANEL
ALT: 12 U/L (ref 0–53)
AST: 14 U/L (ref 0–37)
Albumin: 3.9 g/dL (ref 3.5–5.2)
Alkaline Phosphatase: 56 U/L (ref 39–117)
Bilirubin, Direct: 0.2 mg/dL (ref 0.0–0.3)
Total Bilirubin: 1.2 mg/dL (ref 0.2–1.2)
Total Protein: 7.6 g/dL (ref 6.0–8.3)

## 2024-02-07 ENCOUNTER — Telehealth: Payer: Self-pay | Admitting: Gastroenterology

## 2024-02-07 NOTE — Telephone Encounter (Addendum)
 Procedure:Endoscopy Procedure date: 02/16/24 Procedure location: [facility] Arrival Time: 6:00 am Spoke with the patient Y/N: Yes Any prep concerns? No Has the patient obtained the prep from the pharmacy ? No prep needed Do you have a care partner and transportation: Yes Any additional concerns? No

## 2024-02-08 ENCOUNTER — Encounter (HOSPITAL_COMMUNITY): Payer: Self-pay | Admitting: Pediatrics

## 2024-02-08 ENCOUNTER — Other Ambulatory Visit: Payer: Self-pay

## 2024-02-08 NOTE — Progress Notes (Signed)
 Anesthesia Review:  PCP: Cardiologist :  PPM/ ICD: Device Orders: Rep Notified:  Chest x-ray :05/06/23- 2 view  EKG :05/06/23  Echo : Stress test: Cardiac Cath :   Activity level:  Sleep Study/ CPAP : Fasting Blood Sugar :      / Checks Blood Sugar -- times a day:     DM- type 2- does not check glucose  Mounjaro- last dose on 02/07/24  Metformin- none am of procedure  Blood Thinner/ Instructions /Last Dose: ASA / Instructions/ Last Dose :

## 2024-02-16 ENCOUNTER — Ambulatory Visit (HOSPITAL_COMMUNITY): Admitting: Certified Registered"

## 2024-02-16 ENCOUNTER — Encounter (HOSPITAL_COMMUNITY): Admission: RE | Disposition: A | Payer: Self-pay | Source: Home / Self Care | Attending: Pediatrics

## 2024-02-16 ENCOUNTER — Ambulatory Visit (HOSPITAL_COMMUNITY)
Admission: RE | Admit: 2024-02-16 | Discharge: 2024-02-16 | Disposition: A | Attending: Pediatrics | Admitting: Pediatrics

## 2024-02-16 ENCOUNTER — Encounter: Payer: Self-pay | Admitting: Pediatrics

## 2024-02-16 ENCOUNTER — Other Ambulatory Visit: Payer: Self-pay

## 2024-02-16 ENCOUNTER — Encounter (HOSPITAL_COMMUNITY): Payer: Self-pay | Admitting: Pediatrics

## 2024-02-16 ENCOUNTER — Encounter (HOSPITAL_COMMUNITY): Admitting: Certified Registered"

## 2024-02-16 DIAGNOSIS — K449 Diaphragmatic hernia without obstruction or gangrene: Secondary | ICD-10-CM

## 2024-02-16 DIAGNOSIS — K2100 Gastro-esophageal reflux disease with esophagitis, without bleeding: Secondary | ICD-10-CM | POA: Diagnosis not present

## 2024-02-16 DIAGNOSIS — K2970 Gastritis, unspecified, without bleeding: Secondary | ICD-10-CM

## 2024-02-16 DIAGNOSIS — K209 Esophagitis, unspecified without bleeding: Secondary | ICD-10-CM

## 2024-02-16 HISTORY — DX: Inflammatory liver disease, unspecified: K75.9

## 2024-02-16 HISTORY — PX: ESOPHAGOGASTRODUODENOSCOPY: SHX5428

## 2024-02-16 HISTORY — DX: Prediabetes: R73.03

## 2024-02-16 HISTORY — DX: Polyneuropathy, unspecified: G62.9

## 2024-02-16 LAB — GLUCOSE, CAPILLARY: Glucose-Capillary: 84 mg/dL (ref 70–99)

## 2024-02-16 SURGERY — EGD (ESOPHAGOGASTRODUODENOSCOPY)
Anesthesia: Monitor Anesthesia Care

## 2024-02-16 MED ORDER — SODIUM CHLORIDE 0.9 % IV SOLN: INTRAVENOUS | Status: DC

## 2024-02-16 MED ORDER — SODIUM CHLORIDE 0.9 % IV SOLN
INTRAVENOUS | Status: AC | PRN
  Administered 2024-02-16: 500 mL via INTRAMUSCULAR

## 2024-02-16 MED ORDER — PROPOFOL 1000 MG/100ML IV EMUL
INTRAVENOUS | Status: AC
  Filled 2024-02-16: qty 100

## 2024-02-16 MED ORDER — LIDOCAINE 2% (20 MG/ML) 5 ML SYRINGE
INTRAMUSCULAR | Status: DC | PRN
  Administered 2024-02-16: 100 mg via INTRAVENOUS

## 2024-02-16 MED ORDER — PROPOFOL 500 MG/50ML IV EMUL
INTRAVENOUS | Status: DC | PRN
  Administered 2024-02-16: 125 ug/kg/min via INTRAVENOUS

## 2024-02-16 MED ORDER — PROPOFOL 500 MG/50ML IV EMUL
INTRAVENOUS | Status: AC
  Filled 2024-02-16: qty 100

## 2024-02-16 NOTE — Anesthesia Preprocedure Evaluation (Signed)
 Anesthesia Evaluation  Patient identified by MRN, date of birth, ID band Patient awake    Reviewed: Allergy & Precautions, NPO status , Patient's Chart, lab work & pertinent test results  Airway Mallampati: II  TM Distance: >3 FB Neck ROM: Full    Dental   Pulmonary sleep apnea , COPD, Current Smoker and Patient abstained from smoking.   breath sounds clear to auscultation       Cardiovascular hypertension, Pt. on medications  Rhythm:Regular Rate:Normal     Neuro/Psych negative neurological ROS     GI/Hepatic ,GERD  Medicated,,(+) Hepatitis -  Endo/Other  diabetes, Type 2Hypothyroidism    Renal/GU negative Renal ROS     Musculoskeletal  (+) Arthritis ,    Abdominal   Peds  Hematology negative hematology ROS (+)   Anesthesia Other Findings   Reproductive/Obstetrics                              Anesthesia Physical Anesthesia Plan  ASA: 3  Anesthesia Plan: MAC   Post-op Pain Management:    Induction:   PONV Risk Score and Plan: 0 and Propofol infusion  Airway Management Planned: Natural Airway and Nasal Cannula  Additional Equipment:   Intra-op Plan:   Post-operative Plan:   Informed Consent: I have reviewed the patients History and Physical, chart, labs and discussed the procedure including the risks, benefits and alternatives for the proposed anesthesia with the patient or authorized representative who has indicated his/her understanding and acceptance.       Plan Discussed with:   Anesthesia Plan Comments:         Anesthesia Quick Evaluation

## 2024-02-16 NOTE — Transfer of Care (Signed)
 Immediate Anesthesia Transfer of Care Note  Patient: Larry Richmond  Procedure(s) Performed: EGD (ESOPHAGOGASTRODUODENOSCOPY)  Patient Location: PACU and Endoscopy Unit  Anesthesia Type:MAC  Level of Consciousness: awake, drowsy, and patient cooperative  Airway & Oxygen Therapy: Patient Spontanous Breathing and Patient connected to face mask oxygen  Post-op Assessment: Report given to RN and Post -op Vital signs reviewed and stable  Post vital signs: Reviewed and stable  Last Vitals:  Vitals Value Taken Time  BP 129/68   Temp    Pulse 56 02/16/24 08:22  Resp 17 02/16/24 08:22  SpO2 98 % 02/16/24 08:22  Vitals shown include unfiled device data.  Last Pain:  Vitals:   02/16/24 0629  TempSrc: Temporal  PainSc: 0-No pain         Complications: No notable events documented.

## 2024-02-16 NOTE — H&P (Signed)
 [facility] History and Physical   Primary Care Physician:  Clinic, Bon[facility] Lien   Reason for Procedure:  Follow-up of esophagitis  Plan:    Upper endoscopy   The patient was provided an opportunity to ask questions and all were answered. The patient agreed with the plan.   HPI: Larry Richmond is a 73 y.o. male undergoing upper endoscopy for follow-up of LA grade C esophagitis found on upper endoscopy for 2025.  Currently on Protonix  40 mg p.o. twice daily.  Endorses intermittent symptoms of breakthrough reflux 2-3 times per week.  No dysphagia.  Does endorse a sensation of fullness in his upper chest when eating which could be related to prior hiatal hernia.  Reports making lifestyle changes including not eating late at night and keeping the head of his bed elevated when sleeping.   Past Medical History:  Diagnosis Date   Anxiety    Arthritis    Benign essential HTN 01/13/2019   Blood transfusion without reported diagnosis    Clotting disorder    Colon polyps    Depression    GERD (gastroesophageal reflux disease)    Hepatitis    HTN (hypertension)    Hyperlipidemia    Neuropathy    Obesity    Pneumonia    Pre-diabetes    Pulmonary embolism (HCC)    Sleep apnea    does not use cpap   Thyroid disease    Tubular adenoma of colon 2017    Past Surgical History:  Procedure Laterality Date   COLONOSCOPY     lipoma cyst     removed from back   THYROID SURGERY      Prior to Admission medications   Medication Sig Start Date End Date Taking? Authorizing Provider  acetaminophen  (TYLENOL ) 500 MG tablet Take 1,000 mg by mouth 3 (three) times daily as needed. 12/03/22  Yes [provider]  albuterol  (VENTOLIN  HFA) 108 (90 Base) MCG/ACT inhaler Inhale 2 puffs into the lungs in the morning, at noon, in the evening, and at bedtime.   Yes [provider]  atorvastatin  (LIPITOR) 80 MG tablet Take 80 mg by mouth daily. 06/01/23  Yes [provider]  B Complex Vitamins (VITAMIN B COMPLEX) CAPS Take 2 capsules by mouth daily. 06/01/23  Yes [provider]  buPROPion (WELLBUTRIN XL) 300 MG 24 hr tablet Take 300 mg by mouth daily. 11/22/22  Yes [provider]  cyclobenzaprine  (FLEXERIL ) 10 MG tablet Take 1 tablet (10 mg total) by mouth 2 (two) times daily as needed for muscle spasms. 05/07/23  Yes Plunkett, Benton, MD  DULoxetine (CYMBALTA) 30 MG capsule Take 90 mg by mouth daily. 11/22/22  Yes [provider]  Evolocumab  (REPATHA  SURECLICK) 140 MG/ML SOAJ Inject 140 mg into the skin every 14 (fourteen) days. 06/24/23  Yes Court Dorn PARAS, MD  famotidine  (PEPCID ) 40 MG tablet Take 1 tablet (40 mg total) by mouth daily. 12/19/23  Yes Craig Palma R, PA-C  folic acid (FOLVITE) 1 MG tablet Take 1 mg by mouth daily. 12/27/22  Yes [provider]  furosemide  (LASIX ) 20 MG tablet Take 20 mg by mouth 2 (two) times daily. 06/01/23  Yes [provider]  HYDROcodone -acetaminophen  (NORCO/VICODIN) 5-325 MG tablet Take 1 tablet by mouth every 6 (six) hours as needed. 05/07/23  Yes Plunkett, Benton, MD  levothyroxine  (SYNTHROID ) 100 MCG tablet Take 100 mcg by mouth daily before breakfast.   Yes [provider]  lidocaine (LIDODERM) 5 % Place 1  patch onto the skin every 12 (twelve) hours. 06/01/23  Yes [provider]  lisinopril  (ZESTRIL ) 2.5 MG tablet Take 1 tablet by mouth daily. 06/01/23  Yes [provider]  metFORMIN (GLUCOPHAGE-XR) 500 MG 24 hr tablet Take 500 mg by mouth daily with breakfast. 06/01/23  Yes [provider]  Allie Gutta Calcium  500 MG TABS Take 1 tablet by mouth 3 (three) times daily. 07/08/16  Yes [provider]  pantoprazole  (PROTONIX ) 40 MG tablet Take 1 tablet (40 mg total) by mouth 2 (two) times daily before a meal. 12/19/23  Yes Craig Palma R, PA-C  prazosin (MINIPRESS) 2 MG capsule Take 2 mg by mouth at bedtime. 11/22/22  Yes  [provider]  pregabalin (LYRICA) 150 MG capsule Take 150 mg by mouth 2 (two) times daily. 06/01/23  Yes [provider]  sildenafil (VIAGRA) 100 MG tablet Take 50 mg by mouth as needed. 06/01/23  Yes [provider]  silodosin (RAPAFLO) 8 MG CAPS capsule Take 8 mg by mouth daily. 02/15/23  Yes [provider]  Cholecalciferol 25 MCG (1000 UT) TBDP Take 1 tablet by mouth daily. 11/22/22   [provider]  tirzepatide CLOYDE) 2.5 MG/0.5ML Pen Inject 2.5 mg into the skin once a week. 12/12/23   [provider]    Current Facility-Administered Medications  Medication Dose Route Frequency Provider Last Rate Last Admin   0.9 %  sodium chloride  infusion   Intravenous Continuous Craig Palma R, PA-C       0.9 %  sodium chloride  infusion    Continuous PRN Suzann Inocente HERO, MD 10 mL/hr at 02/16/24 9341 Continued from Pre-op at 02/16/24 0658    Allergies as of 12/19/2023   (No Known Allergies)    Family History  Problem Relation Age of Onset   Alzheimer's disease Mother    Cancer Father        type unknown   Kidney disease Father    Colon cancer Neg Hx    Rectal cancer Neg Hx    Stomach cancer Neg Hx     Social History   Socioeconomic History   Marital status: Single    Spouse name: Not on file   Number of children: 3   Years of education: Not on file   Highest education level: Not on file  Occupational History   Not on file  Tobacco Use   Smoking status: Some Days    Types: Cigarettes   Smokeless tobacco: Never   Tobacco comments:    on occasions  Vaping Use   Vaping status: Never Used  Substance and Sexual Activity   Alcohol use: Yes    Comment: occasional   Drug use: Yes    Types: Marijuana    Comment: occas   Sexual activity: Not on file  Other Topics Concern   Not on file  Social History Narrative   Not on file   Social Drivers of Health   Financial Resource Strain: Medium Risk (06/24/2023)   Overall  Financial Resource Strain (CARDIA)    Difficulty of Paying Living Expenses: Somewhat hard  Food Insecurity: No Food Insecurity (06/24/2023)   Hunger Vital Sign    Worried About Running Out of Food in the Last Year: Never true    Ran Out of Food in the Last Year: Never true  Transportation Needs: No Transportation Needs (06/24/2023)   PRAPARE - Administrator, Civil Service (Medical): No    Lack of Transportation (Non-Medical): No  Physical Activity: Not on file  Stress: Not on file  Social Connections: Not on file  Intimate Partner Violence: Not on file    Review of Systems:  All other review of systems negative except as mentioned in the HPI.  Physical Exam: Vital signs BP (!) 151/62   Pulse (!) 58   Temp (!) 97.4 F (36.3 C) (Temporal)   Resp 18   Ht 6' (1.829 m)   Wt (!) 154.2 kg   SpO2 96%   BMI 46.11 kg/m   General:   Alert,  Well-developed, well-nourished, pleasant and cooperative in NAD Lungs:  Clear throughout to auscultation.   Heart:  Regular rate and rhythm; no murmurs, clicks, rubs,  or gallops. Abdomen:  Soft, nontender and nondistended. Normal bowel sounds.   Neuro/Psych:  Normal mood and affect. A and O x 3  Inocente Hausen, MD [facility]

## 2024-02-16 NOTE — Anesthesia Procedure Notes (Signed)
 Procedure Name: MAC Date/Time: 02/16/2024 8:03 AM  Performed by: Metta Andrea NOVAK, CRNAPre-anesthesia Checklist: Patient identified, Emergency Drugs available, Suction available, Patient being monitored and Timeout performed Oxygen Delivery Method: Simple face mask Placement Confirmation: positive ETCO2

## 2024-02-16 NOTE — Anesthesia Postprocedure Evaluation (Signed)
 Anesthesia Post Note  Patient: Larry Richmond  Procedure(s) Performed: EGD (ESOPHAGOGASTRODUODENOSCOPY)     Patient location during evaluation: PACU Anesthesia Type: MAC Level of consciousness: awake and alert Pain management: pain level controlled Vital Signs Assessment: post-procedure vital signs reviewed and stable Respiratory status: spontaneous breathing, nonlabored ventilation, respiratory function stable and patient connected to nasal cannula oxygen Cardiovascular status: stable and blood pressure returned to baseline Postop Assessment: no apparent nausea or vomiting Anesthetic complications: no   No notable events documented.  Last Vitals:  Vitals:   02/16/24 0839 02/16/24 0840  BP:    Pulse: (!) 51 (!) 51  Resp: 20 16  Temp:    SpO2: 98% 98%    Last Pain:  Vitals:   02/16/24 0840  TempSrc:   PainSc: 0-No pain                 Epifanio Lamar BRAVO

## 2024-02-16 NOTE — Discharge Instructions (Signed)

## 2024-02-16 NOTE — Op Note (Addendum)
 [facility] Patient Name: Larry Richmond Procedure Date: 02/16/2024 MRN: [id number] Attending MD: Inocente Hausen , MD, [id number] Date of Birth: 1950-12-25 CSN: [id number] Age: 73 Admit Type: Outpatient Procedure:                Upper GI endoscopy Indications:              Follow-up of gastro-esophageal reflux disease,                            Follow-up of reflux esophagitis, Follow-up of                            hiatal hernia Providers:                Inocente Hausen, MD, Darleene Bare, RN, Corky Czech,                            Technician Referring MD:              Medicines:                Monitored Anesthesia Care Complications:            No immediate complications. Estimated blood loss:                            None. Estimated Blood Loss:     Estimated blood loss: none. Procedure:                Pre-Anesthesia Assessment:                           - Prior to the procedure, a History and Physical                            was performed, and patient medications and                            allergies were reviewed. The patient's tolerance of                            previous anesthesia was also reviewed. The risks                            and benefits of the procedure and the sedation                            options and risks were discussed with the patient.                            All questions were answered, and informed consent                            was obtained. Prior Anticoagulants: The patient has                            taken no anticoagulant or antiplatelet agents. ASA  Grade Assessment: III - A patient with severe                            systemic disease. After reviewing the risks and                            benefits, the patient was deemed in satisfactory                            condition to undergo the procedure.                           After obtaining informed consent, the endoscope was                             passed under direct vision. Throughout the                            procedure, the patient's blood pressure, pulse, and                            oxygen saturations were monitored continuously. The                            [id number]-[id number] ([id number]) Olympus endoscope was introduced                            through the mouth, and advanced to the second part                            of duodenum. The upper GI endoscopy was                            accomplished without difficulty. The patient                            tolerated the procedure well. Scope In: Scope Out: Findings:      The upper third of the esophagus and middle third of the esophagus were       normal.      LA Grade B (one or more mucosal breaks greater than 5 mm, not extending       between the tops of two mucosal folds) esophagitis was found.      The gastric body, gastric antrum, cardia (on retroflexion) and gastric       fundus (on retroflexion) were normal.      A medium-sized hiatal hernia was present.      The duodenal bulb and second portion of the duodenum were normal. Impression:               - Normal upper third of esophagus and middle third                            of esophagus.                           -  LA Grade B reflux esophagitis.                           - Normal gastric body, antrum, cardia and gastric                            fundus.                           - Medium-sized hiatal hernia.                           - Normal duodenal bulb and second portion of the                            duodenum.                           - No specimens collected. Moderate Sedation:      Not Applicable - Patient had care per Anesthesia. Recommendation:           - Discharge patient to home (ambulatory).                           - Continue pantoprazole  40 mg p.o. twice daily 20                            to 30 minutes before breakfast and dinner and                            famotidine  40  mg p.o. nightly. Confirm patient                            compliance and medication timing before meals. If                            patient has been taking PPI and H2 blocker                            consistently and at appropriate time may consider                            trial of Voquezna.                           - The findings and recommendations were discussed                            with the patient's family.                           - Patient has a contact number available for                            emergencies. The signs and symptoms of potential  delayed complications were discussed with the                            patient. Return to normal activities tomorrow.                            Written discharge instructions were provided to the                            patient. Procedure Code(s):        --- Professional ---                           [phone], Esophagogastroduodenoscopy, flexible,                            transoral; diagnostic, including collection of                            specimen(s) by brushing or washing, when performed                            (separate procedure) Diagnosis Code(s):        --- Professional ---                           K21.00, Gastro-esophageal reflux disease with                            esophagitis, without bleeding                           K44.9, Diaphragmatic hernia without obstruction or                            gangrene CPT copyright 2022 [employer]. All rights reserved. The codes documented in this report are preliminary and upon coder review may  be revised to meet current compliance requirements. Inocente Hausen, MD 02/16/2024 8:22:15 AM This report has been signed electronically. Number of Addenda: 0

## 2024-02-17 ENCOUNTER — Encounter (HOSPITAL_COMMUNITY): Payer: Self-pay | Admitting: Pediatrics
